# Patient Record
Sex: Female | Born: 1965 | Race: Black or African American | Hispanic: No | Smoking: Former smoker
Health system: Southern US, Community
[De-identification: ages and names within clinical notes are randomized; demographics above are authoritative.]

## PROBLEM LIST (undated history)

## (undated) DIAGNOSIS — D320 Benign neoplasm of cerebral meninges: Secondary | ICD-10-CM

## (undated) DIAGNOSIS — I739 Peripheral vascular disease, unspecified: Secondary | ICD-10-CM

## (undated) DIAGNOSIS — R51 Headache: Secondary | ICD-10-CM

## (undated) DIAGNOSIS — F419 Anxiety disorder, unspecified: Secondary | ICD-10-CM

## (undated) DIAGNOSIS — I779 Disorder of arteries and arterioles, unspecified: Secondary | ICD-10-CM

## (undated) DIAGNOSIS — IMO0001 Reserved for inherently not codable concepts without codable children: Secondary | ICD-10-CM

## (undated) DIAGNOSIS — I639 Cerebral infarction, unspecified: Secondary | ICD-10-CM

## (undated) DIAGNOSIS — R87619 Unspecified abnormal cytological findings in specimens from cervix uteri: Secondary | ICD-10-CM

## (undated) DIAGNOSIS — L719 Rosacea, unspecified: Secondary | ICD-10-CM

## (undated) DIAGNOSIS — Z531 Procedure and treatment not carried out because of patient's decision for reasons of belief and group pressure: Secondary | ICD-10-CM

## (undated) DIAGNOSIS — G500 Trigeminal neuralgia: Secondary | ICD-10-CM

## (undated) DIAGNOSIS — R519 Headache, unspecified: Secondary | ICD-10-CM

## (undated) HISTORY — PX: OOPHORECTOMY: SHX86

## (undated) HISTORY — DX: Unspecified abnormal cytological findings in specimens from cervix uteri: R87.619

## (undated) HISTORY — DX: Benign neoplasm of cerebral meninges: D32.0

## (undated) HISTORY — DX: Trigeminal neuralgia: G50.0

## (undated) HISTORY — DX: Cerebral infarction, unspecified: I63.9

## (undated) HISTORY — DX: Headache: R51

## (undated) HISTORY — PX: GYNECOLOGIC CRYOSURGERY: SHX857

## (undated) HISTORY — DX: Headache, unspecified: R51.9

## (undated) HISTORY — DX: Anxiety disorder, unspecified: F41.9

---

## 2016-12-17 DIAGNOSIS — R030 Elevated blood-pressure reading, without diagnosis of hypertension: Secondary | ICD-10-CM | POA: Diagnosis not present

## 2016-12-17 DIAGNOSIS — J209 Acute bronchitis, unspecified: Secondary | ICD-10-CM | POA: Diagnosis not present

## 2017-01-23 ENCOUNTER — Encounter: Payer: Self-pay | Admitting: Emergency Medicine

## 2017-01-23 ENCOUNTER — Emergency Department
Admission: EM | Admit: 2017-01-23 | Discharge: 2017-01-23 | Disposition: A | Discharge status: Home / Self Care | Payer: BLUE CROSS/BLUE SHIELD | Source: Home / Self Care | Attending: Family Medicine | Admitting: Family Medicine

## 2017-01-23 DIAGNOSIS — R053 Chronic cough: Secondary | ICD-10-CM

## 2017-01-23 DIAGNOSIS — J9801 Acute bronchospasm: Secondary | ICD-10-CM

## 2017-01-23 DIAGNOSIS — R05 Cough: Secondary | ICD-10-CM

## 2017-01-23 MED ORDER — BENZONATATE 200 MG PO CAPS: ORAL_CAPSULE | ORAL | 0 refills | Status: DC

## 2017-01-23 MED ORDER — ALBUTEROL SULFATE HFA 108 (90 BASE) MCG/ACT IN AERS: 2.0000 | INHALATION_SPRAY | RESPIRATORY_TRACT | 0 refills | Status: DC | PRN

## 2017-01-23 MED ORDER — PREDNISONE 20 MG PO TABS: ORAL_TABLET | ORAL | 0 refills | Status: DC

## 2017-01-23 NOTE — ED Triage Notes (Signed)
Pt c/o a non-productive cough x 1 month, wheezing, seen @ an urgent care, given a z-pack, albuterol inhaler, benzotate, some relief, but sxs have returned.

## 2017-01-23 NOTE — Discharge Instructions (Signed)
Take plain guaifenesin (1200mg extended release tabs such as Mucinex) twice daily, with plenty of water, for cough and congestion.  °Stop all antihistamines for now, and other non-prescription cough/cold preparations. °  °

## 2017-01-23 NOTE — ED Provider Notes (Signed)
[facility]    CSN: [id number] Arrival date & time: 01/23/17  [id number]     History   Chief Complaint Chief Complaint  Patient presents with  . Cough    HPI Ashley Beard is a 51 y.o. female.   Patient had a cold-like illness about one month ago.  She visited an urgent care where she was prescribed a Z-pak, albuterol inhaler, and Tessalon perles.  She generally improved but complains of persistent cough and occasional wheezing.  No fevers, chills, and sweats.  No pleuritic pain. She has a history of seasonal rhinitis and family history of asthma.   The history is provided by the patient.    History reviewed. No pertinent past medical history.  There are no active problems to display for this patient.   Past Surgical History:  Procedure Laterality Date  . OOPHORECTOMY      OB History    No data available       Home Medications    Prior to Admission medications   Medication Sig Start Date End Date Taking? Authorizing Provider  albuterol (PROVENTIL HFA;VENTOLIN HFA) 108 (90 Base) MCG/ACT inhaler Inhale 2 puffs into the lungs every 4 (four) hours as needed for wheezing or shortness of breath. 01/23/17   Kandra Nicolas, MD  benzonatate (TESSALON) 200 MG capsule Take one cap by mouth at bedtime as needed for cough.  May repeat in 4 to 6 hours 01/23/17   Kandra Nicolas, MD  predniSONE (DELTASONE) 20 MG tablet Take one tab by mouth twice daily for 5 days, then one daily for 3 days. Take with food. 01/23/17   Kandra Nicolas, MD    Family History Family History  Problem Relation Age of Onset  . Hypertension Other   . Diabetes Other     Social History Social History  Substance Use Topics  . Smoking status: Former Research scientist (life sciences)  . Smokeless tobacco: Never Used  . Alcohol use Yes     Allergies   Penicillins   Review of Systems Review of Systems No sore throat + cough No pleuritic pain + wheezing + nasal congestion + post-nasal drainage No sinus  pain/pressure No itchy/red eyes No earache No hemoptysis No SOB No fever/chills No nausea No vomiting No abdominal pain No diarrhea No urinary symptoms No skin rash No fatigue No myalgias No headache Used OTC meds without relief   Physical Exam Triage Vital Signs ED Triage Vitals  Enc Vitals Group     BP 01/23/17 1022 159/99     Pulse Rate 01/23/17 1022 80     Resp --      Temp 01/23/17 1022 97.9 F (36.6 C)     Temp Source 01/23/17 1022 Oral     SpO2 01/23/17 1022 98 %     Weight 01/23/17 1022 131 lb 12 oz (59.8 kg)     Height 01/23/17 1022 4\' 11"  (1.499 m)     Head Circumference --      Peak Flow --      Pain Score 01/23/17 1025 0     Pain Loc --      Pain Edu? --      Excl. in [place]? --    No data found.   Updated Vital Signs BP 159/99 (BP Location: Left Arm)   Pulse 80   Temp 97.9 F (36.6 C) (Oral)   Ht 4\' 11"  (1.499 m)   Wt 131 lb 12 oz (59.8 kg)   LMP 12/26/2016  SpO2 98%   BMI 26.61 kg/m   Visual Acuity Right Eye Distance:   Left Eye Distance:   Bilateral Distance:    Right Eye Near:   Left Eye Near:    Bilateral Near:     Physical Exam Nursing notes and Vital Signs reviewed. Appearance:  Patient appears stated age, and in no acute distress Eyes:  Pupils are equal, round, and reactive to light and accomodation.  Extraocular movement is intact.  Conjunctivae are not inflamed  Ears:  Canals normal.  Tympanic membranes normal.  Nose:  Mildly congested turbinates.  No sinus tenderness.   Pharynx:  Normal Neck:  Supple.  No adenopathy. Lungs:  Clear to auscultation.  Breath sounds are equal.  Moving air well. Heart:  Regular rate and rhythm without murmurs, rubs, or gallops.  Abdomen:  Nontender without masses or hepatosplenomegaly.  Bowel sounds are present.  No CVA or flank tenderness.  Extremities:  No edema.  Skin:  No rash present.    UC Treatments / Results  Labs (all labs ordered are listed, but only abnormal results are  displayed) Labs Reviewed - No data to display  EKG  EKG Interpretation None       Radiology No results found.  Procedures Procedures (including critical care time)  Medications Ordered in UC Medications - No data to display   Initial Impression / Assessment and Plan / UC Course  I have reviewed the triage vital signs and the nursing notes.  Pertinent labs & imaging results that were available during my care of the patient were reviewed by me and considered in my medical decision making (see chart for details).    There is no evidence of bacterial infection today.  Suspect post-infectious cough with bronchospasm. Begin prednisone burst/taper.  Prescription written for Benzonatate [facility]) to take at bedtime for night-time cough.  Refill albuterol inhaler. Take plain guaifenesin (1200mg  extended release tabs such as Mucinex) twice daily, with plenty of water, for cough and congestion.    Stop all antihistamines for now, and other non-prescription cough/cold preparations. Followup with Family Doctor if not improved in about 10 to 14 days.    Final Clinical Impressions(s) / UC Diagnoses   Final diagnoses:  Persistent cough for 3 weeks or longer  Bronchospasm    New Prescriptions New Prescriptions   ALBUTEROL (PROVENTIL HFA;VENTOLIN HFA) 108 (90 BASE) MCG/ACT INHALER    Inhale 2 puffs into the lungs every 4 (four) hours as needed for wheezing or shortness of breath.   BENZONATATE (TESSALON) 200 MG CAPSULE    Take one cap by mouth at bedtime as needed for cough.  May repeat in 4 to 6 hours   PREDNISONE (DELTASONE) 20 MG TABLET    Take one tab by mouth twice daily for 5 days, then one daily for 3 days. Take with food.     Kandra Nicolas, MD 01/31/17 [phone]

## 2017-01-26 DIAGNOSIS — R0789 Other chest pain: Secondary | ICD-10-CM | POA: Diagnosis not present

## 2017-01-26 DIAGNOSIS — R05 Cough: Secondary | ICD-10-CM | POA: Diagnosis not present

## 2017-01-26 DIAGNOSIS — Z880 Allergy status to penicillin: Secondary | ICD-10-CM | POA: Diagnosis not present

## 2017-01-26 DIAGNOSIS — F419 Anxiety disorder, unspecified: Secondary | ICD-10-CM | POA: Diagnosis not present

## 2017-01-26 DIAGNOSIS — R450 Nervousness: Secondary | ICD-10-CM | POA: Diagnosis not present

## 2017-01-26 DIAGNOSIS — R51 Headache: Secondary | ICD-10-CM | POA: Diagnosis not present

## 2017-02-15 ENCOUNTER — Emergency Department
Admission: EM | Admit: 2017-02-15 | Discharge: 2017-02-15 | Disposition: A | Discharge status: Home / Self Care | Payer: BLUE CROSS/BLUE SHIELD | Source: Home / Self Care | Attending: Family Medicine | Admitting: Family Medicine

## 2017-02-15 ENCOUNTER — Encounter: Payer: Self-pay | Admitting: *Deleted

## 2017-02-15 DIAGNOSIS — R5383 Other fatigue: Secondary | ICD-10-CM | POA: Diagnosis not present

## 2017-02-15 DIAGNOSIS — E061 Subacute thyroiditis: Secondary | ICD-10-CM

## 2017-02-15 LAB — POCT CBC W AUTO DIFF (K'VILLE URGENT CARE)

## 2017-02-15 LAB — POCT RAPID STREP A (OFFICE): Rapid Strep A Screen: NEGATIVE

## 2017-02-15 NOTE — Discharge Instructions (Signed)
Begin Ibuprofen 200mg , 4 tabs every 8 hours with food.

## 2017-02-15 NOTE — ED Triage Notes (Signed)
Pt c/o sore throat on the LT side x 1 wk. Denies fever.

## 2017-02-15 NOTE — ED Provider Notes (Signed)
[name]    CSN: [id number] Arrival date & time: 02/15/17  1526     History   Chief Complaint Chief Complaint  Patient presents with  . Sore Throat    HPI Ashley Beard is a 51 y.o. female.   Patient was treated for a post-infectious cough approximately 3 weeks ago with prednisone, Tessalon, and albuterol inhaler.  Her symptoms resolved, but during the past week she has developed sore throat, worse on the left with left neck soreness.  No fevers, chills, and sweats.  No URI symptoms otherwise. Family history significant for thyroid disease in her maternal grandmother and maternal aunt.   The history is provided by the patient.  Sore Throat  This is a new problem. Episode onset: one week ago. The problem occurs constantly. The problem has not changed since onset.Pertinent negatives include no chest pain. Associated symptoms comments: No URI symptoms . Nothing relieves the symptoms. She has tried nothing for the symptoms.    History reviewed. No pertinent past medical history.  There are no active problems to display for this patient.   Past Surgical History:  Procedure Laterality Date  . OOPHORECTOMY      OB History    No data available       Home Medications    Prior to Admission medications   Medication Sig Start Date End Date Taking? Authorizing Provider  albuterol (PROVENTIL HFA;VENTOLIN HFA) 108 (90 Base) MCG/ACT inhaler Inhale 2 puffs into the lungs every 4 (four) hours as needed for wheezing or shortness of breath. 01/23/17   Kandra Nicolas, MD    Family History Family History  Problem Relation Age of Onset  . Hypertension Other   . Diabetes Other   . Diabetes Maternal Grandmother   . Hypertension Maternal Grandmother     Social History Social History  Substance Use Topics  . Smoking status: Former Research scientist (life sciences)  . Smokeless tobacco: Never Used  . Alcohol use Yes     Allergies   Penicillins   Review of Systems Review of Systems    Constitutional: Negative for activity change, appetite change, chills, diaphoresis, fatigue and fever.  HENT: Positive for sore throat. Negative for congestion and postnasal drip.   Respiratory: Negative for cough.   Cardiovascular: Negative for chest pain.  Gastrointestinal: Negative.   Musculoskeletal: Negative for arthralgias.  Skin: Negative.   All other systems reviewed and are negative.    Physical Exam Triage Vital Signs ED Triage Vitals  Enc Vitals Group     BP 02/15/17 1545 155/90     Pulse Rate 02/15/17 1545 70     Resp 02/15/17 1545 16     Temp 02/15/17 1545 97.6 F (36.4 C)     Temp Source 02/15/17 1545 Oral     SpO2 02/15/17 1545 97 %     Weight 02/15/17 1546 128 lb (58.1 kg)     Height 02/15/17 1546 4\' 11"  (1.499 m)     Head Circumference --      Peak Flow --      Pain Score 02/15/17 1547 0     Pain Loc --      Pain Edu? --      Excl. in [place]? --    No data found.   Updated Vital Signs BP 155/90 (BP Location: Left Arm)   Pulse 70   Temp 97.6 F (36.4 C) (Oral)   Resp 16   Ht 4\' 11"  (1.499 m)   Wt 128 lb (58.1  kg)   LMP 01/25/2017   SpO2 97%   BMI 25.85 kg/m   Visual Acuity Right Eye Distance:   Left Eye Distance:   Bilateral Distance:    Right Eye Near:   Left Eye Near:    Bilateral Near:     Physical Exam  Constitutional: She appears well-developed and well-nourished. No distress.  HENT:  Head: Normocephalic.  Right Ear: Tympanic membrane, external ear and ear canal normal.  Left Ear: Tympanic membrane, external ear and ear canal normal.  Nose: Nose normal.  Mouth/Throat: Oropharynx is clear and moist. No oropharyngeal exudate, posterior oropharyngeal edema, posterior oropharyngeal erythema or tonsillar abscesses.  Eyes: Conjunctivae are normal. Pupils are equal, round, and reactive to light.  Neck: Normal range of motion. No thyroid mass and no thyromegaly present.    There is distinct tenderness to palpation over the left aspect of  thyroid, although it is not appreciably enlarged.  Left tonsillar nodes are distinctly tender to palpation.  Cardiovascular: Normal heart sounds.   Pulmonary/Chest: Breath sounds normal.  Abdominal: There is no tenderness.  Musculoskeletal: She exhibits no edema.  Lymphadenopathy:    She has cervical adenopathy.  Neurological: She is alert.  Skin: Skin is warm and dry.  Nursing note and vitals reviewed.    UC Treatments / Results  Labs (all labs ordered are listed, but only abnormal results are displayed) Labs Reviewed  TSH  T4, FREE  T3, FREE  C-REACTIVE PROTEIN  SEDIMENTATION RATE  CBC WITH DIFFERENTIAL/PLATELET  POCT RAPID STREP A (OFFICE) negative  POCT CBC W AUTO DIFF ([facility]):  WBC 7.8; LY 23.8; MO 5.9; GR 70.3; Hgb 13.0; Platelets 133     EKG  EKG Interpretation None       Radiology No results found.  Procedures Procedures (including critical care time)  Medications Ordered in UC Medications - No data to display   Initial Impression / Assessment and Plan / UC Course  I have reviewed the triage vital signs and the nursing notes.  Pertinent labs & imaging results that were available during my care of the patient were reviewed by me and considered in my medical decision making (see chart for details).    Negative rapid strep test and normal WBC reassuring (note mild thrombocytopenia).  Exam is highly suggestive of subacute thyroiditis (note family history of thyroid disease) Will check TSH, free T4, free T3, Sed rate and CRP. Begin Ibuprofen 200mg , 4 tabs every 8 hours with food.  Followup with PCP for further management    Final Clinical Impressions(s) / UC Diagnoses   Final diagnoses:  Subacute thyroiditis    New Prescriptions New Prescriptions   No medications on file     Kandra Nicolas, MD 02/18/17 [phone]

## 2017-02-16 LAB — T3, FREE: T3 FREE: 2.4 pg/mL (ref 2.3–4.2)

## 2017-02-16 LAB — SEDIMENTATION RATE: SED RATE: 28 mm/h — AB (ref 0–20)

## 2017-02-16 LAB — C-REACTIVE PROTEIN: CRP: 6.1 mg/L (ref <8.0)

## 2017-02-16 LAB — TSH: TSH: 1.91 mIU/L

## 2017-02-16 LAB — T4, FREE: Free T4: 1 ng/dL (ref 0.8–1.8)

## 2017-02-17 ENCOUNTER — Telehealth: Payer: Self-pay | Admitting: Emergency Medicine

## 2017-02-17 NOTE — Telephone Encounter (Signed)
Patient called and received lab results; throat still sore and would like offered antibiotic; Dr.Beese will escribe.

## 2017-02-17 NOTE — Telephone Encounter (Signed)
Left message for patient to call us and let us know how she is doing and also for [place] to share her lab results.

## 2017-02-18 NOTE — Care Management (Signed)
TSH, Free T4, Free T3, and C-reactive protein WNL.  Sed rate elevated 28.  Thyroiditis less likely.  Patient reports that she has a persistent sore throat.  Will cover for possible fusobacterium pharyngitis with Omnicef 300mg  BID.

## 2017-02-22 ENCOUNTER — Inpatient Hospital Stay (HOSPITAL_COMMUNITY)
Admission: EM | Admit: 2017-02-22 | Discharge: 2017-03-02 | DRG: 037 | Disposition: A | Discharge status: Home / Self Care | Payer: BLUE CROSS/BLUE SHIELD | Attending: Vascular Surgery | Admitting: Vascular Surgery

## 2017-02-22 ENCOUNTER — Institutional Professional Consult (permissible substitution): Payer: BLUE CROSS/BLUE SHIELD | Admitting: Family Medicine

## 2017-02-22 ENCOUNTER — Telehealth: Payer: Self-pay | Admitting: Physician Assistant

## 2017-02-22 ENCOUNTER — Ambulatory Visit (INDEPENDENT_AMBULATORY_CARE_PROVIDER_SITE_OTHER): Payer: BLUE CROSS/BLUE SHIELD

## 2017-02-22 ENCOUNTER — Ambulatory Visit (INDEPENDENT_AMBULATORY_CARE_PROVIDER_SITE_OTHER): Payer: BLUE CROSS/BLUE SHIELD | Admitting: Physician Assistant

## 2017-02-22 ENCOUNTER — Encounter (HOSPITAL_COMMUNITY): Payer: Self-pay | Admitting: Emergency Medicine

## 2017-02-22 ENCOUNTER — Encounter: Payer: Self-pay | Admitting: Physician Assistant

## 2017-02-22 ENCOUNTER — Telehealth: Payer: Self-pay

## 2017-02-22 VITALS — BP 137/93 | HR 71 | Ht 59.0 in | Wt 126.0 lb

## 2017-02-22 DIAGNOSIS — R030 Elevated blood-pressure reading, without diagnosis of hypertension: Secondary | ICD-10-CM | POA: Diagnosis not present

## 2017-02-22 DIAGNOSIS — Z91013 Allergy to seafood: Secondary | ICD-10-CM

## 2017-02-22 DIAGNOSIS — R590 Localized enlarged lymph nodes: Secondary | ICD-10-CM | POA: Diagnosis present

## 2017-02-22 DIAGNOSIS — R1310 Dysphagia, unspecified: Secondary | ICD-10-CM | POA: Insufficient documentation

## 2017-02-22 DIAGNOSIS — R591 Generalized enlarged lymph nodes: Secondary | ICD-10-CM

## 2017-02-22 DIAGNOSIS — F419 Anxiety disorder, unspecified: Secondary | ICD-10-CM | POA: Diagnosis not present

## 2017-02-22 DIAGNOSIS — R4702 Dysphasia: Secondary | ICD-10-CM | POA: Diagnosis not present

## 2017-02-22 DIAGNOSIS — Z8673 Personal history of transient ischemic attack (TIA), and cerebral infarction without residual deficits: Secondary | ICD-10-CM | POA: Diagnosis not present

## 2017-02-22 DIAGNOSIS — I7771 Dissection of carotid artery: Secondary | ICD-10-CM | POA: Diagnosis not present

## 2017-02-22 DIAGNOSIS — I10 Essential (primary) hypertension: Secondary | ICD-10-CM | POA: Insufficient documentation

## 2017-02-22 DIAGNOSIS — Z9889 Other specified postprocedural states: Secondary | ICD-10-CM | POA: Diagnosis not present

## 2017-02-22 DIAGNOSIS — Z1239 Encounter for other screening for malignant neoplasm of breast: Secondary | ICD-10-CM

## 2017-02-22 DIAGNOSIS — D696 Thrombocytopenia, unspecified: Secondary | ICD-10-CM | POA: Diagnosis not present

## 2017-02-22 DIAGNOSIS — G939 Disorder of brain, unspecified: Secondary | ICD-10-CM | POA: Diagnosis not present

## 2017-02-22 DIAGNOSIS — Z419 Encounter for procedure for purposes other than remedying health state, unspecified: Secondary | ICD-10-CM

## 2017-02-22 DIAGNOSIS — Z9102 Food additives allergy status: Secondary | ICD-10-CM | POA: Diagnosis not present

## 2017-02-22 DIAGNOSIS — Z23 Encounter for immunization: Secondary | ICD-10-CM

## 2017-02-22 DIAGNOSIS — Z79899 Other long term (current) drug therapy: Secondary | ICD-10-CM | POA: Diagnosis not present

## 2017-02-22 DIAGNOSIS — Z8249 Family history of ischemic heart disease and other diseases of the circulatory system: Secondary | ICD-10-CM | POA: Diagnosis not present

## 2017-02-22 DIAGNOSIS — R414 Neurologic neglect syndrome: Secondary | ICD-10-CM | POA: Diagnosis not present

## 2017-02-22 DIAGNOSIS — I6521 Occlusion and stenosis of right carotid artery: Secondary | ICD-10-CM | POA: Diagnosis not present

## 2017-02-22 DIAGNOSIS — I63522 Cerebral infarction due to unspecified occlusion or stenosis of left anterior cerebral artery: Secondary | ICD-10-CM | POA: Diagnosis not present

## 2017-02-22 DIAGNOSIS — Z880 Allergy status to penicillin: Secondary | ICD-10-CM | POA: Diagnosis not present

## 2017-02-22 DIAGNOSIS — I739 Peripheral vascular disease, unspecified: Secondary | ICD-10-CM | POA: Diagnosis not present

## 2017-02-22 DIAGNOSIS — I63312 Cerebral infarction due to thrombosis of left middle cerebral artery: Secondary | ICD-10-CM | POA: Diagnosis not present

## 2017-02-22 DIAGNOSIS — Z1231 Encounter for screening mammogram for malignant neoplasm of breast: Secondary | ICD-10-CM

## 2017-02-22 DIAGNOSIS — I639 Cerebral infarction, unspecified: Secondary | ICD-10-CM | POA: Diagnosis not present

## 2017-02-22 DIAGNOSIS — Z91018 Allergy to other foods: Secondary | ICD-10-CM | POA: Diagnosis not present

## 2017-02-22 DIAGNOSIS — I97821 Postprocedural cerebrovascular infarction during other surgery: Secondary | ICD-10-CM | POA: Diagnosis not present

## 2017-02-22 DIAGNOSIS — R4701 Aphasia: Secondary | ICD-10-CM | POA: Diagnosis not present

## 2017-02-22 DIAGNOSIS — R202 Paresthesia of skin: Secondary | ICD-10-CM | POA: Diagnosis not present

## 2017-02-22 DIAGNOSIS — I779 Disorder of arteries and arterioles, unspecified: Secondary | ICD-10-CM | POA: Diagnosis present

## 2017-02-22 DIAGNOSIS — D72829 Elevated white blood cell count, unspecified: Secondary | ICD-10-CM | POA: Diagnosis not present

## 2017-02-22 DIAGNOSIS — I63412 Cerebral infarction due to embolism of left middle cerebral artery: Secondary | ICD-10-CM | POA: Diagnosis not present

## 2017-02-22 DIAGNOSIS — Z87891 Personal history of nicotine dependence: Secondary | ICD-10-CM | POA: Diagnosis not present

## 2017-02-22 DIAGNOSIS — H53461 Homonymous bilateral field defects, right side: Secondary | ICD-10-CM | POA: Diagnosis not present

## 2017-02-22 DIAGNOSIS — Z90722 Acquired absence of ovaries, bilateral: Secondary | ICD-10-CM

## 2017-02-22 DIAGNOSIS — Z0000 Encounter for general adult medical examination without abnormal findings: Secondary | ICD-10-CM

## 2017-02-22 DIAGNOSIS — R471 Dysarthria and anarthria: Secondary | ICD-10-CM | POA: Diagnosis not present

## 2017-02-22 DIAGNOSIS — I6522 Occlusion and stenosis of left carotid artery: Secondary | ICD-10-CM | POA: Diagnosis not present

## 2017-02-22 DIAGNOSIS — Z833 Family history of diabetes mellitus: Secondary | ICD-10-CM | POA: Diagnosis not present

## 2017-02-22 DIAGNOSIS — I748 Embolism and thrombosis of other arteries: Secondary | ICD-10-CM | POA: Diagnosis not present

## 2017-02-22 DIAGNOSIS — I6789 Other cerebrovascular disease: Secondary | ICD-10-CM | POA: Diagnosis not present

## 2017-02-22 DIAGNOSIS — G459 Transient cerebral ischemic attack, unspecified: Secondary | ICD-10-CM

## 2017-02-22 HISTORY — DX: Procedure and treatment not carried out because of patient's decision for reasons of belief and group pressure: Z53.1

## 2017-02-22 HISTORY — DX: Reserved for inherently not codable concepts without codable children: IMO0001

## 2017-02-22 HISTORY — DX: Disorder of arteries and arterioles, unspecified: I77.9

## 2017-02-22 HISTORY — DX: Peripheral vascular disease, unspecified: I73.9

## 2017-02-22 LAB — COMPLETE METABOLIC PANEL WITH GFR
ALT: 14 U/L (ref 6–29)
AST: 19 U/L (ref 10–35)
Albumin: 4.3 g/dL (ref 3.6–5.1)
Alkaline Phosphatase: 62 U/L (ref 33–115)
BILIRUBIN TOTAL: 0.5 mg/dL (ref 0.2–1.2)
BUN: 12 mg/dL (ref 7–25)
CALCIUM: 9.3 mg/dL (ref 8.6–10.2)
CO2: 28 mmol/L (ref 20–31)
CREATININE: 0.98 mg/dL (ref 0.50–1.10)
Chloride: 103 mmol/L (ref 98–110)
GFR, EST AFRICAN AMERICAN: 78 mL/min (ref >=60)
GFR, Est Non African American: 68 mL/min (ref >=60)
Glucose, Bld: 92 mg/dL (ref 65–99)
Potassium: 3.5 mmol/L (ref 3.5–5.3)
Sodium: 140 mmol/L (ref 135–146)
TOTAL PROTEIN: 6.8 g/dL (ref 6.1–8.1)

## 2017-02-22 LAB — CBC WITH DIFFERENTIAL/PLATELET
Basophils Absolute: 0 10*3/uL (ref 0.0–0.1)
Basophils Relative: 0 %
EOS ABS: 0.2 10*3/uL (ref 0.0–0.7)
EOS PCT: 2 %
HCT: 39.8 % (ref 36.0–46.0)
HEMOGLOBIN: 13.8 g/dL (ref 12.0–15.0)
LYMPHS ABS: 2.8 10*3/uL (ref 0.7–4.0)
LYMPHS PCT: 28 %
MCH: 32.8 pg (ref 26.0–34.0)
MCHC: 34.7 g/dL (ref 30.0–36.0)
MCV: 94.5 fL (ref 78.0–100.0)
MONOS PCT: 8 %
Monocytes Absolute: 0.8 10*3/uL (ref 0.1–1.0)
NEUTROS PCT: 62 %
Neutro Abs: 6.2 10*3/uL (ref 1.7–7.7)
Platelets: 125 10*3/uL — ABNORMAL LOW (ref 150–400)
RBC: 4.21 MIL/uL (ref 3.87–5.11)
RDW: 12.8 % (ref 11.5–15.5)
WBC: 10 10*3/uL (ref 4.0–10.5)

## 2017-02-22 LAB — BASIC METABOLIC PANEL
Anion gap: 13 (ref 5–15)
BUN: 15 mg/dL (ref 6–20)
CHLORIDE: 105 mmol/L (ref 101–111)
CO2: 21 mmol/L — ABNORMAL LOW (ref 22–32)
Calcium: 9 mg/dL (ref 8.9–10.3)
Creatinine, Ser: 1.04 mg/dL — ABNORMAL HIGH (ref 0.44–1.00)
GFR calc Af Amer: >60 mL/min (ref >60)
GFR calc non Af Amer: >60 mL/min (ref >60)
GLUCOSE: 99 mg/dL (ref 65–99)
POTASSIUM: 3.2 mmol/L — AB (ref 3.5–5.1)
SODIUM: 139 mmol/L (ref 135–145)

## 2017-02-22 LAB — PROTIME-INR
INR: 0.88
PROTHROMBIN TIME: 11.9 s (ref 11.4–15.2)

## 2017-02-22 LAB — I-STAT CHEM 8, ED
BUN: 17 mg/dL (ref 6–20)
CALCIUM ION: 1.05 mmol/L — AB (ref 1.15–1.40)
CHLORIDE: 104 mmol/L (ref 101–111)
Creatinine, Ser: 0.9 mg/dL (ref 0.44–1.00)
GLUCOSE: 102 mg/dL — AB (ref 65–99)
HCT: 38 % (ref 36.0–46.0)
Hemoglobin: 12.9 g/dL (ref 12.0–15.0)
POTASSIUM: 3.2 mmol/L — AB (ref 3.5–5.1)
Sodium: 139 mmol/L (ref 135–145)
TCO2: 29 mmol/L (ref 0–100)

## 2017-02-22 MED ORDER — IOPAMIDOL (ISOVUE-300) INJECTION 61%
75.0000 mL | Freq: Once | INTRAVENOUS | Status: AC | PRN
  Administered 2017-02-22: 75 mL via INTRAVENOUS

## 2017-02-22 MED ORDER — GUAIFENESIN-DM 100-10 MG/5ML PO SYRP: 15.0000 mL | ORAL_SOLUTION | ORAL | Status: DC | PRN

## 2017-02-22 MED ORDER — SODIUM CHLORIDE 0.9% FLUSH: 3.0000 mL | INTRAVENOUS | Status: DC | PRN

## 2017-02-22 MED ORDER — ALUM & MAG HYDROXIDE-SIMETH 200-200-20 MG/5ML PO SUSP: 15.0000 mL | ORAL | Status: DC | PRN

## 2017-02-22 MED ORDER — LABETALOL HCL 5 MG/ML IV SOLN: 10.0000 mg | INTRAVENOUS | Status: DC | PRN

## 2017-02-22 MED ORDER — HEPARIN (PORCINE) IN NACL 100-0.45 UNIT/ML-% IJ SOLN
950.0000 [IU]/h | INTRAMUSCULAR | Status: DC
  Administered 2017-02-22: 950 [IU]/h via INTRAVENOUS
  Filled 2017-02-22: qty 250

## 2017-02-22 MED ORDER — PHENOL 1.4 % MT LIQD: 1.0000 | OROMUCOSAL | Status: DC | PRN

## 2017-02-22 MED ORDER — SODIUM CHLORIDE 0.9% FLUSH
3.0000 mL | Freq: Two times a day (BID) | INTRAVENOUS | Status: DC
Start: 2017-02-22 — End: 2017-02-24
  Administered 2017-02-22: 3 mL via INTRAVENOUS

## 2017-02-22 MED ORDER — METOPROLOL TARTRATE 5 MG/5ML IV SOLN
2.0000 mg | INTRAVENOUS | Status: DC | PRN
Start: 2017-02-22 — End: 2017-02-24

## 2017-02-22 MED ORDER — ONDANSETRON HCL 4 MG/2ML IJ SOLN: 4.0000 mg | Freq: Four times a day (QID) | INTRAMUSCULAR | Status: DC | PRN

## 2017-02-22 MED ORDER — HEPARIN BOLUS VIA INFUSION
3500.0000 [IU] | Freq: Once | INTRAVENOUS | Status: AC
  Administered 2017-02-22: 3500 [IU] via INTRAVENOUS
  Filled 2017-02-22: qty 3500

## 2017-02-22 MED ORDER — POTASSIUM CHLORIDE CRYS ER 20 MEQ PO TBCR
20.0000 meq | EXTENDED_RELEASE_TABLET | Freq: Once | ORAL | Status: AC
  Administered 2017-02-22: 40 meq via ORAL
  Filled 2017-02-22: qty 2

## 2017-02-22 MED ORDER — PANTOPRAZOLE SODIUM 40 MG PO TBEC
40.0000 mg | DELAYED_RELEASE_TABLET | Freq: Every day | ORAL | Status: DC
  Administered 2017-02-23: 40 mg via ORAL
  Filled 2017-02-22: qty 1

## 2017-02-22 MED ORDER — SODIUM CHLORIDE 0.9 % IV SOLN: 250.0000 mL | INTRAVENOUS | Status: DC | PRN

## 2017-02-22 MED ORDER — HYDRALAZINE HCL 20 MG/ML IJ SOLN: 5.0000 mg | INTRAMUSCULAR | Status: DC | PRN

## 2017-02-22 NOTE — Telephone Encounter (Signed)
Patient's CT neck revealed a 9 mm thrombus or plaque at the left carotid bifurcation with extension into the left external carotid artery concerning for carotid dissection.   I spoke with Dr. Curt Jews, vascular surgeon on-call, who recommended patient for admission to their service for anticoagulation and possible surgery this evening.   I called the patient, informed her of the thrombus in her left carotid, explained that this was a medical emergency and instructed her to present to [facility] ED as soon as possible. She expressed understanding and is contacting her sister for transport there now.  I called [facility] and gave report to triage nurse.  Darlyne Russian PA-C

## 2017-02-22 NOTE — ED Provider Notes (Signed)
[facility] DEPT Provider Note   CSN: [id number] Arrival date & time: 02/22/17  1922     History   Chief Complaint Chief Complaint  Patient presents with  . Throat Mass needs surgery    HPI Ashley Beard is a 51 y.o. female.  51 yo F with a chief complaint of right-sided throat pain. Is worse when she swallows. Been going on for the past couple months or longer. The patient went to urgent care there was concern that she had subacute thyroiditis and labs that were performed. She returned a couple days will later with no improvement in there is some anterior cervical lymphadenopathy and so she went for a CT scan of the neck. This was concerning for clot burden possibly consistent with a carotid artery dissection. Case was discussed with vascular who recommend that she be sent to the ED for admission.   The history is provided by the patient.  Illness  This is a new problem. The current episode started more than 1 week ago. The problem occurs rarely. The problem has not changed since onset.Pertinent negatives include no chest pain, no headaches and no shortness of breath. Nothing aggravates the symptoms. Nothing relieves the symptoms. She has tried nothing for the symptoms. The treatment provided no relief.    Past Medical History:  Diagnosis Date  . Abnormal Pap smear of cervix    age 8  . Anxiety     Patient Active Problem List   Diagnosis Date Noted  . Odynophagia 02/22/2017  . Lymphadenopathy of head and neck 02/22/2017  . Elevated blood pressure reading 02/22/2017    Past Surgical History:  Procedure Laterality Date  . GYNECOLOGIC CRYOSURGERY     age 23  . OOPHORECTOMY      OB History    No data available       Home Medications    Prior to Admission medications   Medication Sig Start Date End Date Taking? Authorizing Provider  albuterol (PROVENTIL HFA;VENTOLIN HFA) 108 (90 Base) MCG/ACT inhaler Inhale 2 puffs into the lungs every 4 (four) hours as needed  for wheezing or shortness of breath. 01/23/17   Kandra Nicolas, MD    Family History Family History  Problem Relation Age of Onset  . Hypertension Other   . Diabetes Other   . Diabetes Maternal Grandmother   . Hypertension Maternal Grandmother     Social History Social History  Substance Use Topics  . Smoking status: Former Smoker    Types: Cigarettes    Quit date: 12/2016  . Smokeless tobacco: Never Used     Comment: very rare, social  . Alcohol use 1.2 oz/week    2 Cans of beer per week     Comment: social     Allergies   Penicillins   Review of Systems Review of Systems  Constitutional: Negative for chills and fever.  HENT: Positive for sore throat. Negative for congestion and rhinorrhea.   Eyes: Negative for redness and visual disturbance.  Respiratory: Negative for shortness of breath and wheezing.   Cardiovascular: Negative for chest pain and palpitations.  Gastrointestinal: Negative for nausea and vomiting.  Genitourinary: Negative for dysuria and urgency.  Musculoskeletal: Negative for arthralgias and myalgias.  Skin: Negative for pallor and wound.  Neurological: Negative for dizziness and headaches.     Physical Exam Updated Vital Signs BP (!) 161/99   Pulse 71   Temp 98.3 F (36.8 C) (Oral)   Resp 16   Ht 4\' 11"  (  1.499 m)   Wt 127 lb (57.6 kg)   LMP 01/25/2017   SpO2 100%   BMI 25.65 kg/m   Physical Exam  Constitutional: She is oriented to person, place, and time. She appears well-developed and well-nourished. No distress.  HENT:  Head: Normocephalic and atraumatic.  Eyes: EOM are normal. Pupils are equal, round, and reactive to light.  Neck: Normal range of motion. Neck supple.  Cardiovascular: Normal rate and regular rhythm.  Exam reveals no gallop and no friction rub.   No murmur heard. Pulmonary/Chest: Effort normal. She has no wheezes. She has no rales.  Abdominal: Soft. She exhibits no distension. There is no tenderness.    Musculoskeletal: She exhibits no edema or tenderness.  Neurological: She is alert and oriented to person, place, and time.  Grossly intact   Skin: Skin is warm and dry. She is not diaphoretic.  Psychiatric: She has a normal mood and affect. Her behavior is normal.  Nursing note and vitals reviewed.    ED Treatments / Results  Labs (all labs ordered are listed, but only abnormal results are displayed) Labs Reviewed  CBC WITH DIFFERENTIAL/PLATELET - Abnormal; Notable for the following:       Result Value   Platelets 125 (*)    All other components within normal limits  I-STAT CHEM 8, ED - Abnormal; Notable for the following:    Potassium 3.2 (*)    Glucose, Bld 102 (*)    Calcium, Ion 1.05 (*)    All other components within normal limits  BASIC METABOLIC PANEL  PROTIME-INR    EKG  EKG Interpretation None       Radiology Ct Soft Tissue Neck W Contrast  Result Date: 02/22/2017 CLINICAL DATA:  Left neck pain with swallowing. Lymphadenopathy of the head and neck. Odynophagia. EXAM: CT NECK WITH CONTRAST TECHNIQUE: Multidetector CT imaging of the neck was performed using the standard protocol following the bolus administration of intravenous contrast. CONTRAST:  8mL ISOVUE-300 IOPAMIDOL (ISOVUE-300) INJECTION 61% COMPARISON:  None. FINDINGS: Pharynx and larynx: There is mild prominence of the palatine tonsils bilaterally, left greater than right. No discrete mass lesion is present. The parapharyngeal fat is clear. Vocal cords are opposed. The patient is likely holding her breath. No other focal mucosal or submucosal lesions are evident. Salivary glands: The submandibular and parotid glands are within normal limits bilaterally. Thyroid: A 6 mm nodule in the upper pole of the left lobe of the thyroid measures 6 mm maximally. No other focal lesions are present. Lymph nodes: A right level 3 lymph node may represent 2 adjacent nodes. The nodal mass measures 22 x 6 x 8 mm. No other  significant adenopathy is present. Vascular: A low-density clot or plaque sits at the level of the left carotid bifurcation measuring 8 x 8 x 9 mm. There is thrombus extending into the left external carotid artery. No other significant vascular disease is present. A three-vessel aortic arch is present. Limited intracranial: Negative Visualized orbits: The globes and orbits are within normal limits. Mastoids and visualized paranasal sinuses: The paranasal sinuses and mastoid air cells are clear. Skeleton: No focal lytic or blastic lesions are present. Vertebral body heights and alignment are normal. Upper chest: The lung apices demonstrate mild dependent atelectasis. No focal nodule, mass, or airspace disease is present. IMPRESSION: 1. 9 mm thrombus or plaque at the left carotid bifurcation with extension into the left external carotid artery. This could be related to the patient's left-sided neck pain. This may  be from a central source will represent a contained dissection. The recommend emergent evaluation with consideration of anti coagulation to prevent intracranial embolization. 2. No other significant vascular disease. 3. Slight prominence of the palatine tonsils. No adjacent inflammatory change is evident. 4. Enlarged right level 3 lymph node is likely reactive. This may represent 2 adjacent nodes. These results were called by telephone at the time of interpretation on 02/22/2017 at 4:39 pm to Dr. Nelson Chimes , who verbally acknowledged these results. Electronically Signed   By: San Morelle M.D.   On: 02/22/2017 16:39    Procedures Procedures (including critical care time)  Medications Ordered in ED Medications - No data to display   Initial Impression / Assessment and Plan / ED Course  I have reviewed the triage vital signs and the nursing notes.  Pertinent labs & imaging results that were available during my care of the patient were reviewed by me and considered in my medical  decision making (see chart for details).     51 yo F With a chief complaint of right sided neck pain. Found to have a left carotid artery clot. Vascular to admit.   The patients results and plan were reviewed and discussed.   Any x-rays performed were independently reviewed by myself.   Differential diagnosis were considered with the presenting HPI.  Medications - No data to display  Vitals:   02/22/17 1933 02/22/17 1934 02/22/17 2023  BP: (!) 146/91  (!) 161/99  Pulse: 81  71  Resp: 16  16  Temp: 98.3 F (36.8 C)    TempSrc: Oral    SpO2: 100%  100%  Weight:  127 lb (57.6 kg)   Height:  4\' 11"  (1.499 m)     Final diagnoses:  Carotid artery dissection (HCC)    Admission/ observation were discussed with the admitting physician, patient and/or family and they are comfortable with the plan.    Final Clinical Impressions(s) / ED Diagnoses   Final diagnoses:  Carotid artery dissection [facility])    New Prescriptions New Prescriptions   No medications on file     Deno Etienne, DO 02/22/17 2033

## 2017-02-22 NOTE — ED Notes (Signed)
Notified Early MD of pt's presence in department. MD states he is on his way.

## 2017-02-22 NOTE — H&P (Signed)
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 female, who is seen today for evaluation of thrombus in her left carotid artery. She is otherwise healthy 51 year old female. She has had a several month history of discomfort and a burning sensation with swallowing. Also had was felt to be a sinus infection and was treated for this. This is failed to resolve and she saw her primary care physician today for continued evaluation. Patient underwent CT scan as an outpatient for this. The scan did not show any cause for the swallowing difficulty but did show a finding of a large clot located in her left carotid bifurcation with extension into her left external carotid artery. I was consulted and suggested the patient be sent to [facility] for admission anticoagulation and further workup and surgery. I'm seeing the patient in the emergency department and her 2 daughters are present with her. She does not have any history of other clotting disorders. Her only past medical history is significant for anxiety. She is right-handed. On further questioning regarding any neurologic deficits, he does relate a event approximately 2-3 weeks ago where she had limp and numbness in her right arm. This lasted for several minutes and then not completely resolved. She is not had any speech difficulties and no right leg difficulty. She has been completely neurologically intact otherwise. She has no history of cardiac irregularity.  Past Medical History:  Diagnosis Date  . Abnormal Pap smear of cervix    age 33  . Anxiety     Family History  Problem Relation Age of Onset  . Hypertension Other   . Diabetes Other   . Diabetes Maternal Grandmother   . Hypertension Maternal Grandmother     SOCIAL HISTORY: Social History    Social History  . Marital status: Single    Spouse name: N/A  . Number of children: N/A  . Years of education: N/A   Occupational History  . Not on file.   Social History Main Topics  . Smoking status: Former Smoker    Types: Cigarettes    Quit date: 12/2016  . Smokeless tobacco: Never Used     Comment: very rare, social  . Alcohol use 1.2 oz/week    2 Cans of beer per week     Comment: social  . Drug use: No  . Sexual activity: Yes    Birth control/ protection: Condom   Other Topics Concern  . Not on file   Social History Narrative  . No narrative on file    Allergies  Allergen Reactions  . Penicillins     No current facility-administered medications for this encounter.    Current Outpatient Prescriptions  Medication Sig Dispense Refill  . albuterol (PROVENTIL HFA;VENTOLIN HFA) 108 (90 Base) MCG/ACT inhaler Inhale 2 puffs into the lungs every 4 (four) hours as needed for wheezing or shortness of breath. 1 Inhaler 0    REVIEW OF SYSTEMS:  [X]  denotes positive finding, [ ]  denotes negative finding Cardiac  Comments:  Chest pain or chest pressure:    Shortness of breath upon exertion:    Short of breath when lying flat:    Irregular heart rhythm:        Vascular    Pain in calf, thigh, or hip brought on by ambulation:    Pain in feet  at night that wakes you up from your sleep:     Blood clot in your veins:    Leg swelling:         Pulmonary    Oxygen at home:    Productive cough:     Wheezing:         Neurologic    Sudden weakness in arms or legs:  x Recent event 2-3 weeks ago   Sudden numbness in arms or legs:  x   Sudden onset of difficulty speaking or slurred speech:    Temporary loss of vision in one eye:     Problems with dizziness:         Gastrointestinal    Blood in stool:     Vomited blood:         Genitourinary    Burning when urinating:     Blood in urine:        Psychiatric    Major depression:         Hematologic     Bleeding problems:    Problems with blood clotting too easily:        Skin    Rashes or ulcers:        Constitutional    Fever or chills:      PHYSICAL EXAM: Vitals:   02/22/17 1933 02/22/17 1934 02/22/17 2023  BP: (!) 146/91  (!) 161/99  Pulse: 81  71  Resp: 16  16  Temp: 98.3 F (36.8 C)    TempSrc: Oral    SpO2: 100%  100%  Weight:  127 lb (57.6 kg)   Height:  4\' 11"  (1.499 m)     GENERAL: The patient is a well-nourished female, in no acute distress. The vital signs are documented above. CARDIOVASCULAR: Carotid arteries are without bruits bilaterally. 2+ radial and 2+ dorsalis pedis pulses bilaterally PULMONARY: There is good air exchange  ABDOMEN: Soft and non-tender . No bruit noted. MUSCULOSKELETAL: There are no major deformities or cyanosis. NEUROLOGIC: No focal weakness or paresthesias are detected. SKIN: There are no ulcers or rashes noted. PSYCHIATRIC: The patient has a normal affect.  DATA:  CT scan was reviewed. Images were reviewed and discussed with the patient and her daughters. She does have a 1 cm defect in the left carotid bifurcation that appears to be clot. This does have an extension into the external carotid. Internal carotid artery above this is normal. She has a normal aortic arch with no evidence of plaque in the right carotid or in the remaining vascular anatomy seen on the CT  of her neck.  MEDICAL ISSUES: Very unusual presentation. Had a clear-cut left brain TIA 2-3 weeks ago which most likely are related to the clot in her left carotid bifurcation. Much more difficult to relate the swallowing difficulty to this. I do agree that the patient will need ENT evaluation after her carotid thrombus is addressed. Will be admitted tonight for anticoagulation. Will undergo 2-D echocardiogram tomorrow and MRI of her brain tomorrow to evaluate if injury has occurred related to her recent TIA in the clot in her carotid. Will plan for left carotid surgery on  Wednesday. Explained that this would be standard incision with control of the artery opening of the artery and endarterectomy and removal of the clot.   Rosetta Posner, MD FACS [facility] Tel [phone] Pager [phone]

## 2017-02-22 NOTE — Telephone Encounter (Signed)
Received. Thank you.

## 2017-02-22 NOTE — ED Triage Notes (Signed)
Pt. advised by her MD to go to ER for throat ( mass) surgery , respirations unlabored/denies pain or fever .

## 2017-02-22 NOTE — ED Notes (Signed)
Early MD at bedside

## 2017-02-22 NOTE — Progress Notes (Signed)
ANTICOAGULATION CONSULT NOTE - Initial Consult  Pharmacy Consult for Heparin  Indication: Carotid thrombus  Allergies  Allergen Reactions  . Penicillins    Patient Measurements: Height: 4\' 11"  (149.9 cm) Weight: 127 lb (57.6 kg) IBW/kg (Calculated) : 43.2 Heparin Dosing Weight: 55kg  Vital Signs: Temp: 98.3 F (36.8 C) (03/19 1933) Temp Source: Oral (03/19 1933) BP: 158/98 (03/19 2030) Pulse Rate: 75 (03/19 2030)  Labs:  Recent Labs  02/22/17 1047 02/22/17 2001 02/22/17 2021  HGB  --  13.8 12.9  HCT  --  39.8 38.0  PLT  --  125*  --   LABPROT  --  11.9  --   INR  --  0.88  --   CREATININE 0.98 1.04* 0.90    Estimated Creatinine Clearance: 58.5 mL/min (by C-G formula based on SCr of 0.9 mg/dL).   Medical History: Past Medical History:  Diagnosis Date  . Abnormal Pap smear of cervix    age 51  . Anxiety    Medications:   (Not in a hospital admission) Scheduled:  Infusions:   Assessment: 51 yof admitted 3/319 w/ thrombus in left carotid artery. Patient with TIA 2-3 weeks ago. No anticoagulation PTA. Bolus per MD.   Baseline hemoglobin is within normal limits and platelet count low at 125k/uL. No bleeding noted.   Goal of Therapy:  Heparin level 0.3-0.7 units/ml Monitor platelets by anticoagulation protocol: Yes   Plan:  Give 3500 units bolus x 1 Start heparin infusion at 950 units/hr Check anti-Xa level in 6 hours and daily while on heparin Continue to monitor H&H and platelets  F/U plans for oral anticoagulation   Demetrius Charity, PharmD Acute Care Pharmacy Resident  Pager: [phone] 02/22/2017

## 2017-02-22 NOTE — Patient Instructions (Addendum)
- I have ordered a CT of your neck with IV contrast. You will be contacted to schedule this week - Go downstairs for labs to check your kidney function prior to CT scan   - I have also ordered your mammogram. You will be contacted to schedule this week   Blood pressure: - monitor at home in a relaxed setting - limit salt. Follow DASH eating plan   How to Take Your Blood Pressure Blood pressure is a measurement of how strongly your blood is pressing against the walls of your arteries. Arteries are blood vessels that carry blood from your heart throughout your body. Your health care provider takes your blood pressure at each office visit. You can also take your own blood pressure at home with a blood pressure machine. You may need to take your own blood pressure:  To confirm a diagnosis of high blood pressure (hypertension).  To monitor your blood pressure over time.  To make sure your blood pressure medicine is working. Supplies needed: To take your blood pressure, you will need a blood pressure machine. You can buy a blood pressure machine, or blood pressure monitor, at most drugstores or online. There are several types of home blood pressure monitors. When choosing one, consider the following:  Choose a monitor that has an arm cuff.  Choose a monitor that wraps snugly around your upper arm. You should be able to fit only one finger between your arm and the cuff.  Do not choose a monitor that measures your blood pressure from your wrist or finger. Your health care provider can suggest a reliable monitor that will meet your needs. How to prepare To get the most accurate reading, avoid the following for 30 minutes before you check your blood pressure:  Drinking caffeine.  Drinking alcohol.  Eating.  Smoking.  Exercising. Five minutes before you check your blood pressure:  Empty your bladder.  Sit quietly without talking in a dining chair, rather than in a soft couch or  armchair. How to take your blood pressure To check your blood pressure, follow the instructions in the manual that came with your blood pressure monitor. If you have a digital blood pressure monitor, the instructions may be as follows: 1. Sit up straight. 2. Place your feet on the floor. Do not cross your ankles or legs. 3. Rest your left arm at the level of your heart on a table or desk or on the arm of a chair. 4. Pull up your shirt sleeve. 5. Wrap the blood pressure cuff around the upper part of your left arm, 1 inch (2.5 cm) above your elbow. It is best to wrap the cuff around bare skin. 6. Fit the cuff snugly around your arm. You should be able to place only one finger between the cuff and your arm. 7. Position the cord inside the groove of your elbow. 8. Press the power button. 9. Sit quietly while the cuff inflates and deflates. 10. Read the digital reading on the monitor screen and write it down (record it). 11. Wait 2-3 minutes, then repeat the steps, starting at step 1. What does my blood pressure reading mean? A blood pressure reading consists of a higher number over a lower number. Ideally, your blood pressure should be below 120/80. The first ("top") number is called the systolic pressure. It is a measure of the pressure in your arteries as your heart beats. The second ("bottom") number is called the diastolic pressure. It is a measure  of the pressure in your arteries as the heart relaxes. Blood pressure is classified into four stages. The following are the stages for adults who do not have a short-term serious illness or a chronic condition. Systolic pressure and diastolic pressure are measured in a unit called mm Hg. Normal   Systolic pressure: below 295.  Diastolic pressure: below 80. Elevated   Systolic pressure: 188-416.  Diastolic pressure: below 80. Hypertension stage 1   Systolic pressure: 606-301.  Diastolic pressure: 60-10. Hypertension stage 2   Systolic  pressure: 932 or above.  Diastolic pressure: 90 or above. You can have prehypertension or hypertension even if only the systolic or only the diastolic number in your reading is higher than normal. Follow these instructions at home:  Check your blood pressure as often as recommended by your health care provider.  Take your monitor to the next appointment with your health care provider to make sure:  That you are using it correctly.  That it provides accurate readings.  Be sure you understand what your goal blood pressure numbers are.  Tell your health care provider if you are having any side effects from blood pressure medicine. Contact a health care provider if:  Your blood pressure is consistently high. Get help right away if:  Your systolic blood pressure is higher than 180.  Your diastolic blood pressure is higher than 110. This information is not intended to replace advice given to you by your health care provider. Make sure you discuss any questions you have with your health care provider. Document Released: 05/01/2016 Document Revised: 07/14/2016 Document Reviewed: 05/01/2016 Elsevier Interactive Patient Education  2017 [employer].

## 2017-02-22 NOTE — Telephone Encounter (Signed)
[facility] called with stat lab results on CMP.  They are in her chart.

## 2017-02-22 NOTE — Progress Notes (Signed)
HPI:                                                                Ashley Beard is a 51 y.o. female who presents to [facility] today to establish care   Current Concerns include thyroid  Patient reports left-sided throat pain with swallowing for approximately 2 months. States occasionally she has swelling of her neck and lymph nodes on the left side. She describes the pain as burning and it causes her to discontinue eating. She admits to a 4-pound unintentional weight loss. Denies radiation to the ear. Patient states she was put on Prednisone for an upper respiratory infection and steroids did improve her symptoms temporarily. She denies dysphagia. She denies fever, chills, dyspepsia, epigastric pain. Patient is a nonsmoker, but reports briefly experimenting with cigarettes two months ago as a social activity.  She was seen in urgent care on 02/15/17 for sore throat and left-sided neck soreness. Symptoms were preceded by a viral URI. She was diagnosed clinically with subacute thyroiditis. TSH, free T4, and T3 were wnl. ESR was elevated at 28.   Health Maintenance Health Maintenance  Topic Date Due  . HIV Screening  12/03/1982  . MAMMOGRAM  12/03/1985  . TETANUS/TDAP  12/03/1986  . PAP SMEAR  12/03/1988  . INFLUENZA VACCINE  08/08/2019 (Originally 07/07/2016)    GYN/Sexual Health  Menstrual status: having menstrual periods  LMP: 01/24/17  Menses: irregular, light, lasts 1-2 days  Last pap smear: 2010, negative [facility] - [facility]  History of abnormal pap smears: yes, age 23   Sexually active: yes, 1 female partner  Current contraception: condoms   Past Medical History:  Diagnosis Date  . Abnormal Pap smear of cervix    age 41  . Anxiety    Past Surgical History:  Procedure Laterality Date  . GYNECOLOGIC CRYOSURGERY     age 53  . OOPHORECTOMY     Social History  Substance Use Topics  . Smoking status: Former Smoker   Types: Cigarettes    Quit date: 12/2016  . Smokeless tobacco: Never Used     Comment: very rare, social  . Alcohol use 1.2 oz/week    2 Cans of beer per week     Comment: social   family history includes Diabetes in her maternal grandmother and other; Hypertension in her maternal grandmother and other.  ROS: negative except as noted in the HPI  Medications: Current Outpatient Prescriptions  Medication Sig Dispense Refill  . albuterol (PROVENTIL HFA;VENTOLIN HFA) 108 (90 Base) MCG/ACT inhaler Inhale 2 puffs into the lungs every 4 (four) hours as needed for wheezing or shortness of breath. 1 Inhaler 0   No current facility-administered medications for this visit.    Allergies  Allergen Reactions  . Penicillins        Objective:  BP (!) 137/93   Pulse 71   Ht _0  (1.499 m)   Wt 126 lb (57.2 kg)   LMP 01/25/2017   BMI 25.45 kg/m  Gen: well-groomed, cooperative, not ill-appearing, no distress HEENT: normal conjunctiva, TM's clear, oropharynx clear, moist mucus membranes, trachea midline, no thyromegaly or tenderness Pulm: Normal work of breathing, normal phonation, clear to auscultation bilaterally CV: Normal rate, regular  rhythm, s1 and s2 distinct, no murmurs, clicks or rubs, no carotid bruit Neuro: alert and oriented x 3, EOM's intact, no tremor MSK: moving all extremities, normal gait and station, no peripheral edema Lymph: no appreciable cervical or supraclavicular adenopathy, patient tender in left tonsillar distribution Skin: warm and dry, no rashes or lesions on exposed skin Psych: normal affect, euthymic mood, normal speech and thought content  Lab Results  Component Value Date   TSH 1.91 02/15/2017  free T3 - 2.4 Free T4 - 1.0  WBC 7.8 RBC 4.15 Hgb 13.0 Hct 39.5 MCV 95.1 Plt 133   Lab Results  Component Value Date   ESRSEDRATE 28 (H) 02/15/2017   Lab Results  Component Value Date   CRP 6.1 02/15/2017     Assessment and Plan: 51 y.o. female  with    Lymphadenopathy of head and neck, Odynophagia - CT soft tissue neck w/contrast scheduled for this afternoon to r/o neoplasia. Checking BUN and SCr stat today - If CT negative, plan to trial Fluconazole for empiric tx candidal esophagitis and PPI. Will refer to ENT if no improvement in 2 weeks.  Encounter for preventive health examination - reviewed labs obtained by urgent care - screening mammogram ordered - Tdap given today - Pap due   Breast cancer screening - MM DIGITAL SCREENING BILATERAL; Future  Elevated Blood Pressure Reading - monitor at home in a relaxed setting. BP log. - limit salt. Follow DASH eating plan - follow-up in 2 weeks  Patient education and anticipatory guidance given Patient agrees with treatment plan Follow-up in 2 weeks or sooner as needed  Darlyne Russian PA-C

## 2017-02-23 ENCOUNTER — Inpatient Hospital Stay (HOSPITAL_COMMUNITY): Payer: BLUE CROSS/BLUE SHIELD

## 2017-02-23 ENCOUNTER — Encounter (HOSPITAL_COMMUNITY): Payer: Self-pay | Admitting: *Deleted

## 2017-02-23 DIAGNOSIS — I6789 Other cerebrovascular disease: Secondary | ICD-10-CM

## 2017-02-23 LAB — ECHOCARDIOGRAM COMPLETE
CHL CUP TV REG PEAK VELOCITY: 222 cm/s
EERAT: 7.86
EWDT: 222 ms
FS: 28 % (ref 28–44)
Height: 59 in
IVS/LV PW RATIO, ED: 0.93
LA ID, A-P, ES: 21 mm
LA diam end sys: 21 mm
LA vol A4C: 24.1 ml
LA vol index: 19.2 mL/m2
LA vol: 29 mL
LADIAMINDEX: 1.39 cm/m2
LV TDI E'LATERAL: 12.1
LV TDI E'MEDIAL: 7.43
LV e' LATERAL: 12.1 cm/s
LVEEAVG: 7.86
LVEEMED: 7.86
LVOT VTI: 18.8 cm
LVOT area: 2.01 cm2
LVOTD: 16 mm
LVOTPV: 102 cm/s
LVOTSV: 38 mL
Lateral S' vel: 10.6 cm/s
MV Dec: 222
MV Peak grad: 4 mmHg
MV pk A vel: 65 m/s
MVPKEVEL: 95.1 m/s
PW: 9.38 mm — AB (ref 0.6–1.1)
RV TAPSE: 20.8 mm
RV sys press: 23 mmHg
TR max vel: 222 cm/s
Weight: 2012.8 oz

## 2017-02-23 LAB — URINALYSIS, ROUTINE W REFLEX MICROSCOPIC
BILIRUBIN URINE: NEGATIVE
Bacteria, UA: NONE SEEN
GLUCOSE, UA: NEGATIVE mg/dL
KETONES UR: NEGATIVE mg/dL
LEUKOCYTES UA: NEGATIVE
NITRITE: NEGATIVE
PH: 6 (ref 5.0–8.0)
PROTEIN: NEGATIVE mg/dL
Specific Gravity, Urine: 1.011 (ref 1.005–1.030)

## 2017-02-23 LAB — CBC
HCT: 37.7 % (ref 36.0–46.0)
Hemoglobin: 12.9 g/dL (ref 12.0–15.0)
MCH: 32.3 pg (ref 26.0–34.0)
MCHC: 34.2 g/dL (ref 30.0–36.0)
MCV: 94.3 fL (ref 78.0–100.0)
PLATELETS: 128 10*3/uL — AB (ref 150–400)
RBC: 4 MIL/uL (ref 3.87–5.11)
RDW: 12.8 % (ref 11.5–15.5)
WBC: 7.3 10*3/uL (ref 4.0–10.5)

## 2017-02-23 LAB — HEPARIN LEVEL (UNFRACTIONATED)
Heparin Unfractionated: 1.18 IU/mL — ABNORMAL HIGH (ref 0.30–0.70)
Heparin Unfractionated: 0.92 IU/mL — ABNORMAL HIGH (ref 0.30–0.70)
Heparin Unfractionated: 0.8 IU/mL — ABNORMAL HIGH (ref 0.30–0.70)

## 2017-02-23 LAB — HIV ANTIBODY (ROUTINE TESTING W REFLEX): HIV SCREEN 4TH GENERATION: NONREACTIVE

## 2017-02-23 MED ORDER — VANCOMYCIN HCL IN DEXTROSE 1-5 GM/200ML-% IV SOLN
1000.0000 mg | INTRAVENOUS | Status: AC
  Administered 2017-02-24: 1000 mg via INTRAVENOUS
  Filled 2017-02-23 (×2): qty 200

## 2017-02-23 MED ORDER — HEPARIN (PORCINE) IN NACL 100-0.45 UNIT/ML-% IJ SOLN: 800.0000 [IU]/h | INTRAMUSCULAR | Status: DC

## 2017-02-23 MED ORDER — HEPARIN (PORCINE) IN NACL 100-0.45 UNIT/ML-% IJ SOLN
600.0000 [IU]/h | INTRAMUSCULAR | Status: DC
  Administered 2017-02-23: 650 [IU]/h via INTRAVENOUS
  Administered 2017-02-24: 600 [IU]/h via INTRAVENOUS
  Filled 2017-02-23: qty 250

## 2017-02-23 NOTE — Progress Notes (Signed)
ANTICOAGULATION CONSULT NOTE - Follow Up Consult  Pharmacy Consult for Heparin  Indication: Carotid thrombus  Patient Measurements: Height: 4\' 11"  (149.9 cm) Weight: 125 lb 12.8 oz (57.1 kg) IBW/kg (Calculated) : 43.2  Vital Signs: Temp: 98.2 F (36.8 C) (03/20 0454) Temp Source: Oral (03/20 0454) BP: 116/76 (03/20 0454) Pulse Rate: 80 (03/20 0454)  Labs:  Recent Labs  02/22/17 1047  02/22/17 2001 02/22/17 2021 02/23/17 0342  HGB  --   < > 13.8 12.9 12.9  HCT  --   --  39.8 38.0 37.7  PLT  --   --  125*  --  128*  LABPROT  --   --  11.9  --   --   INR  --   --  0.88  --   --   HEPARINUNFRC  --   --   --   --  1.18*  CREATININE 0.98  --  1.04* 0.90  --   < > = values in this interval not displayed.  Estimated Creatinine Clearance: 58.3 mL/min (by C-G formula based on SCr of 0.9 mg/dL).  Assessment: Heparin for carotid thrombus, initial heparin level is elevated at 1.18, confirmed with RN that level drawn from opposite arm of heparin infusion, no issues per RN.   Goal of Therapy:  Heparin level 0.3-0.7 units/ml Monitor platelets by anticoagulation protocol: Yes   Plan:  -Hold heparin x 1 hr -Re-start heparin at 800 units/hr at 0730 -1500 HL  Narda Bonds 02/23/2017,6:38 AM

## 2017-02-23 NOTE — Progress Notes (Signed)
ANTICOAGULATION CONSULT NOTE - FOLLOW UP    HL = 0.92 (goal 0.3 - 0.7 units/mL) Heparin dosing weight = 55 kg   Assessment: 57 YOM with left carotid artery thrombus to continue on IV heparin.  Heparin level remains supra-therapeutic despite rate reduction.  Confirmed with RN that lab was drawn appropriately.  No bleeding reported.   Plan: - Reduce heparin gtt to 650 units/hr - Check 6 hr heparin level    Ashley Beard, PharmD, BCPS 02/23/2017, 4:18 PM

## 2017-02-23 NOTE — Progress Notes (Signed)
Patient ID: Ashley Beard, female   DOB: [birth year], 51 y.o.   MRN: [id number] Stable this morning. On heparin drip. No new neurologic deficits.  For MRI of her brain and 2-D echocardiogram today to rule out cardiogenic source for her left carotid thrombus. Plan left carotid endarterectomy tomorrow. Discussed with the patient who understands

## 2017-02-23 NOTE — Progress Notes (Signed)
Echo completed, however, MRI is still pending.  I have spoken to the pt and plan to proceed with surgery tomorrow pending MRI results.  I have spoken to the pt and she understands.  She has no questions.  Npo after MN.   Leontine Locket, [facility] 02/23/2017 3:19 PM

## 2017-02-23 NOTE — Progress Notes (Addendum)
ANTICOAGULATION CONSULT NOTE Pharmacy Consult for Heparin  Indication: Carotid thrombus  Patient Measurements: Height: 4\' 11"  (149.9 cm) Weight: 125 lb 12.8 oz (57.1 kg) IBW/kg (Calculated) : 43.2  Vital Signs: Temp: 98.1 F (36.7 C) (03/20 2109) Temp Source: Oral (03/20 2109) BP: 113/57 (03/20 2109) Pulse Rate: 79 (03/20 2109)  Labs:  Recent Labs  02/22/17 1047  02/22/17 2001 02/22/17 2021 02/23/17 0342 02/23/17 1417 02/23/17 2253  HGB  --   < > 13.8 12.9 12.9  --   --   HCT  --   --  39.8 38.0 37.7  --   --   PLT  --   --  125*  --  128*  --   --   LABPROT  --   --  11.9  --   --   --   --   INR  --   --  0.88  --   --   --   --   HEPARINUNFRC  --   --   --   --  1.18* 0.92* 0.80*  CREATININE 0.98  --  1.04* 0.90  --   --   --   < > = values in this interval not displayed.  Estimated Creatinine Clearance: 58.3 mL/min (by C-G formula based on SCr of 0.9 mg/dL).  Assessment: 51 y.o. female with carotid thrombus, awaiting CEA and thrombectomy tomorrow, for heparin  Goal of Therapy:  Heparin level 0.3-0.7 units/ml Monitor platelets by anticoagulation protocol: Yes   Plan:  Decrease Heparin 600 units/hr F/U after surgery tomorrow morning   Ashley Beard, Ashley Beard 02/23/2017,11:45 PM

## 2017-02-23 NOTE — Progress Notes (Signed)
  Echocardiogram 2D Echocardiogram has been performed.  Tresa Res 02/23/2017, 11:02 AM

## 2017-02-24 ENCOUNTER — Inpatient Hospital Stay (HOSPITAL_COMMUNITY): Payer: BLUE CROSS/BLUE SHIELD | Admitting: Certified Registered Nurse Anesthetist

## 2017-02-24 ENCOUNTER — Encounter (HOSPITAL_COMMUNITY): Payer: Self-pay | Admitting: Certified Registered Nurse Anesthetist

## 2017-02-24 ENCOUNTER — Encounter (HOSPITAL_COMMUNITY)
Admission: EM | Disposition: A | Discharge status: Home / Self Care | Payer: Self-pay | Source: Home / Self Care | Attending: Vascular Surgery

## 2017-02-24 HISTORY — PX: ENDARTERECTOMY: SHX5162

## 2017-02-24 LAB — CBC
HCT: 39.3 % (ref 36.0–46.0)
Hemoglobin: 13.4 g/dL (ref 12.0–15.0)
MCH: 32.5 pg (ref 26.0–34.0)
MCHC: 34.1 g/dL (ref 30.0–36.0)
MCV: 95.4 fL (ref 78.0–100.0)
PLATELETS: 131 10*3/uL — AB (ref 150–400)
RBC: 4.12 MIL/uL (ref 3.87–5.11)
RDW: 12.7 % (ref 11.5–15.5)
WBC: 7.5 10*3/uL (ref 4.0–10.5)

## 2017-02-24 LAB — SURGICAL PCR SCREEN
MRSA, PCR: NEGATIVE
Staphylococcus aureus: NEGATIVE

## 2017-02-24 LAB — BASIC METABOLIC PANEL
ANION GAP: 8 (ref 5–15)
BUN: 7 mg/dL (ref 6–20)
CALCIUM: 8.9 mg/dL (ref 8.9–10.3)
CO2: 30 mmol/L (ref 22–32)
Chloride: 101 mmol/L (ref 101–111)
Creatinine, Ser: 0.94 mg/dL (ref 0.44–1.00)
GFR calc Af Amer: >60 mL/min (ref >60)
GLUCOSE: 91 mg/dL (ref 65–99)
Potassium: 3.9 mmol/L (ref 3.5–5.1)
Sodium: 139 mmol/L (ref 135–145)

## 2017-02-24 SURGERY — ENDARTERECTOMY, CAROTID
Anesthesia: General | Site: Neck | Laterality: Left

## 2017-02-24 MED ORDER — REMIFENTANIL HCL 1 MG IV SOLR
0.0125 ug/kg/min | INTRAVENOUS | Status: AC
Start: 2017-02-24 — End: 2017-02-24
  Administered 2017-02-24: .2 ug/kg/min via INTRAVENOUS
  Filled 2017-02-24 (×2): qty 2000

## 2017-02-24 MED ORDER — DOCUSATE SODIUM 100 MG PO CAPS
100.0000 mg | ORAL_CAPSULE | Freq: Every day | ORAL | Status: DC
  Administered 2017-02-26 – 2017-03-02 (×5): 100 mg via ORAL
  Filled 2017-02-24 (×6): qty 1

## 2017-02-24 MED ORDER — SODIUM CHLORIDE 0.9 % IV SOLN
INTRAVENOUS | Status: DC | PRN
  Administered 2017-02-24: 500 mL

## 2017-02-24 MED ORDER — HYDRALAZINE HCL 20 MG/ML IJ SOLN: 5.0000 mg | INTRAMUSCULAR | Status: DC | PRN

## 2017-02-24 MED ORDER — PROPOFOL 10 MG/ML IV BOLUS
INTRAVENOUS | Status: AC
  Filled 2017-02-24: qty 40

## 2017-02-24 MED ORDER — PHENYLEPHRINE 40 MCG/ML (10ML) SYRINGE FOR IV PUSH (FOR BLOOD PRESSURE SUPPORT)
PREFILLED_SYRINGE | INTRAVENOUS | Status: AC
  Filled 2017-02-24: qty 10

## 2017-02-24 MED ORDER — 0.9 % SODIUM CHLORIDE (POUR BTL) OPTIME
TOPICAL | Status: DC | PRN
  Administered 2017-02-24: 1000 mL

## 2017-02-24 MED ORDER — PROTAMINE SULFATE 10 MG/ML IV SOLN
INTRAVENOUS | Status: DC | PRN
  Administered 2017-02-24: 50 mg via INTRAVENOUS

## 2017-02-24 MED ORDER — HEPARIN SODIUM (PORCINE) 1000 UNIT/ML IJ SOLN
INTRAMUSCULAR | Status: AC
  Filled 2017-02-24: qty 1

## 2017-02-24 MED ORDER — POLYETHYLENE GLYCOL 3350 17 G PO PACK
17.0000 g | PACK | Freq: Every day | ORAL | Status: DC | PRN
  Administered 2017-03-01: 17 g via ORAL
  Filled 2017-02-24: qty 1

## 2017-02-24 MED ORDER — ONDANSETRON HCL 4 MG/2ML IJ SOLN
INTRAMUSCULAR | Status: DC | PRN
Start: 2017-02-24 — End: 2017-02-24
  Administered 2017-02-24: 4 mg via INTRAVENOUS

## 2017-02-24 MED ORDER — SODIUM CHLORIDE 0.9 % IV SOLN
500.0000 mL | Freq: Once | INTRAVENOUS | Status: AC | PRN
  Administered 2017-02-25: 500 mL via INTRAVENOUS

## 2017-02-24 MED ORDER — PHENYLEPHRINE 40 MCG/ML (10ML) SYRINGE FOR IV PUSH (FOR BLOOD PRESSURE SUPPORT)
PREFILLED_SYRINGE | INTRAVENOUS | Status: DC | PRN
  Administered 2017-02-24 (×2): 80 ug via INTRAVENOUS

## 2017-02-24 MED ORDER — LIDOCAINE 2% (20 MG/ML) 5 ML SYRINGE
INTRAMUSCULAR | Status: DC | PRN
  Administered 2017-02-24: 60 mg via INTRAVENOUS

## 2017-02-24 MED ORDER — GUAIFENESIN-DM 100-10 MG/5ML PO SYRP: 15.0000 mL | ORAL_SOLUTION | ORAL | Status: DC | PRN

## 2017-02-24 MED ORDER — VANCOMYCIN HCL IN DEXTROSE 1-5 GM/200ML-% IV SOLN
1000.0000 mg | Freq: Two times a day (BID) | INTRAVENOUS | Status: AC
  Administered 2017-02-24: 1000 mg via INTRAVENOUS
  Filled 2017-02-24 (×2): qty 200

## 2017-02-24 MED ORDER — SUGAMMADEX SODIUM 200 MG/2ML IV SOLN
INTRAVENOUS | Status: AC
  Filled 2017-02-24: qty 2

## 2017-02-24 MED ORDER — ROCURONIUM BROMIDE 50 MG/5ML IV SOSY
PREFILLED_SYRINGE | INTRAVENOUS | Status: AC
  Filled 2017-02-24: qty 5

## 2017-02-24 MED ORDER — MIDAZOLAM HCL 5 MG/5ML IJ SOLN
INTRAMUSCULAR | Status: DC | PRN
  Administered 2017-02-24: 2 mg via INTRAVENOUS

## 2017-02-24 MED ORDER — ASPIRIN EC 325 MG PO TBEC
325.0000 mg | DELAYED_RELEASE_TABLET | Freq: Every day | ORAL | Status: DC
  Administered 2017-02-26 – 2017-03-02 (×5): 325 mg via ORAL
  Filled 2017-02-24 (×6): qty 1

## 2017-02-24 MED ORDER — METOPROLOL TARTRATE 5 MG/5ML IV SOLN: 2.0000 mg | INTRAVENOUS | Status: DC | PRN

## 2017-02-24 MED ORDER — ACETAMINOPHEN 325 MG PO TABS
325.0000 mg | ORAL_TABLET | ORAL | Status: DC | PRN
  Administered 2017-02-27 – 2017-03-01 (×2): 650 mg via ORAL
  Filled 2017-02-24 (×3): qty 2

## 2017-02-24 MED ORDER — ROCURONIUM BROMIDE 10 MG/ML (PF) SYRINGE
PREFILLED_SYRINGE | INTRAVENOUS | Status: DC | PRN
  Administered 2017-02-24 (×2): 50 mg via INTRAVENOUS

## 2017-02-24 MED ORDER — SODIUM CHLORIDE 0.9 % IV SOLN
INTRAVENOUS | Status: DC
  Administered 2017-02-24: 75 mL/h via INTRAVENOUS

## 2017-02-24 MED ORDER — ONDANSETRON HCL 4 MG/2ML IJ SOLN
4.0000 mg | Freq: Four times a day (QID) | INTRAMUSCULAR | Status: DC | PRN
  Filled 2017-02-24: qty 2

## 2017-02-24 MED ORDER — OXYCODONE-ACETAMINOPHEN 5-325 MG PO TABS
1.0000 | ORAL_TABLET | ORAL | Status: DC | PRN
  Administered 2017-02-26 (×2): 2 via ORAL
  Filled 2017-02-24 (×2): qty 2

## 2017-02-24 MED ORDER — PHENOL 1.4 % MT LIQD: 1.0000 | OROMUCOSAL | Status: DC | PRN

## 2017-02-24 MED ORDER — LABETALOL HCL 5 MG/ML IV SOLN: 10.0000 mg | INTRAVENOUS | Status: DC | PRN

## 2017-02-24 MED ORDER — FENTANYL CITRATE (PF) 100 MCG/2ML IJ SOLN
INTRAMUSCULAR | Status: DC | PRN
  Administered 2017-02-24 (×2): 50 ug via INTRAVENOUS

## 2017-02-24 MED ORDER — BISACODYL 10 MG RE SUPP: 10.0000 mg | Freq: Every day | RECTAL | Status: DC | PRN

## 2017-02-24 MED ORDER — LIDOCAINE 2% (20 MG/ML) 5 ML SYRINGE
INTRAMUSCULAR | Status: AC
  Filled 2017-02-24: qty 5

## 2017-02-24 MED ORDER — PROTAMINE SULFATE 10 MG/ML IV SOLN
INTRAVENOUS | Status: AC
  Filled 2017-02-24: qty 5

## 2017-02-24 MED ORDER — PHENYLEPHRINE HCL 10 MG/ML IJ SOLN
INTRAVENOUS | Status: DC | PRN
  Administered 2017-02-24: 25 ug/min via INTRAVENOUS

## 2017-02-24 MED ORDER — POTASSIUM CHLORIDE CRYS ER 20 MEQ PO TBCR: 20.0000 meq | EXTENDED_RELEASE_TABLET | Freq: Every day | ORAL | Status: DC | PRN

## 2017-02-24 MED ORDER — FENTANYL CITRATE (PF) 100 MCG/2ML IJ SOLN
INTRAMUSCULAR | Status: AC
  Filled 2017-02-24: qty 4

## 2017-02-24 MED ORDER — PROPOFOL 10 MG/ML IV BOLUS
INTRAVENOUS | Status: DC | PRN
  Administered 2017-02-24: 160 mg via INTRAVENOUS

## 2017-02-24 MED ORDER — LACTATED RINGERS IV SOLN
INTRAVENOUS | Status: DC | PRN
  Administered 2017-02-24 (×2): via INTRAVENOUS

## 2017-02-24 MED ORDER — PANTOPRAZOLE SODIUM 40 MG PO TBEC
40.0000 mg | DELAYED_RELEASE_TABLET | Freq: Every day | ORAL | Status: DC
  Administered 2017-02-26 – 2017-03-02 (×5): 40 mg via ORAL
  Filled 2017-02-24 (×6): qty 1

## 2017-02-24 MED ORDER — MIDAZOLAM HCL 2 MG/2ML IJ SOLN
INTRAMUSCULAR | Status: AC
  Filled 2017-02-24: qty 2

## 2017-02-24 MED ORDER — MAGNESIUM SULFATE 2 GM/50ML IV SOLN
2.0000 g | Freq: Every day | INTRAVENOUS | Status: DC | PRN
  Filled 2017-02-24: qty 50

## 2017-02-24 MED ORDER — ACETAMINOPHEN 650 MG RE SUPP: 325.0000 mg | RECTAL | Status: DC | PRN

## 2017-02-24 MED ORDER — HYDROMORPHONE HCL 1 MG/ML IJ SOLN
INTRAMUSCULAR | Status: AC
  Filled 2017-02-24: qty 0.5

## 2017-02-24 MED ORDER — HEPARIN SODIUM (PORCINE) 1000 UNIT/ML IJ SOLN
INTRAMUSCULAR | Status: DC | PRN
  Administered 2017-02-24: 6 mL via INTRAVENOUS

## 2017-02-24 MED ORDER — LIDOCAINE HCL (PF) 1 % IJ SOLN
INTRAMUSCULAR | Status: AC
  Filled 2017-02-24: qty 30

## 2017-02-24 MED ORDER — PROMETHAZINE HCL 25 MG/ML IJ SOLN: 6.2500 mg | INTRAMUSCULAR | Status: DC | PRN

## 2017-02-24 MED ORDER — ENOXAPARIN SODIUM 30 MG/0.3ML ~~LOC~~ SOLN: 30.0000 mg | SUBCUTANEOUS | Status: DC

## 2017-02-24 MED ORDER — MORPHINE SULFATE (PF) 2 MG/ML IV SOLN
2.0000 mg | INTRAVENOUS | Status: DC | PRN
  Administered 2017-02-24 – 2017-02-25 (×2): 2 mg via INTRAVENOUS
  Filled 2017-02-24 (×2): qty 1

## 2017-02-24 MED ORDER — HYDROMORPHONE HCL 1 MG/ML IJ SOLN
0.2500 mg | INTRAMUSCULAR | Status: DC | PRN
  Administered 2017-02-24: 0.5 mg via INTRAVENOUS

## 2017-02-24 MED ORDER — SUGAMMADEX SODIUM 200 MG/2ML IV SOLN
INTRAVENOUS | Status: DC | PRN
  Administered 2017-02-24: 200 mg via INTRAVENOUS

## 2017-02-24 SURGICAL SUPPLY — 44 items
CANISTER SUCT 3000ML PPV (MISCELLANEOUS) ×2 IMPLANT
CANNULA VESSEL 3MM 2 BLNT TIP (CANNULA) ×4 IMPLANT
CATH ROBINSON RED A/P 18FR (CATHETERS) ×2 IMPLANT
CLIP LIGATING EXTRA MED SLVR (CLIP) ×2 IMPLANT
CLIP LIGATING EXTRA SM BLUE (MISCELLANEOUS) ×2 IMPLANT
CONT SPECI 4OZ STER CLIK (MISCELLANEOUS) ×8 IMPLANT
CRADLE DONUT ADULT HEAD (MISCELLANEOUS) ×2 IMPLANT
DECANTER SPIKE VIAL GLASS SM (MISCELLANEOUS) IMPLANT
DERMABOND ADVANCED (GAUZE/BANDAGES/DRESSINGS) ×1
DERMABOND ADVANCED .7 DNX12 (GAUZE/BANDAGES/DRESSINGS) ×1 IMPLANT
DRAIN HEMOVAC 1/8 X 5 (WOUND CARE) IMPLANT
ELECT REM PT RETURN 9FT ADLT (ELECTROSURGICAL) ×2
ELECTRODE REM PT RTRN 9FT ADLT (ELECTROSURGICAL) ×1 IMPLANT
EVACUATOR SILICONE 100CC (DRAIN) IMPLANT
GLOVE BIO SURGEON STRL SZ 6.5 (GLOVE) ×4 IMPLANT
GLOVE BIOGEL PI IND STRL 6 (GLOVE) ×1 IMPLANT
GLOVE BIOGEL PI IND STRL 6.5 (GLOVE) ×3 IMPLANT
GLOVE BIOGEL PI INDICATOR 6 (GLOVE) ×1
GLOVE BIOGEL PI INDICATOR 6.5 (GLOVE) ×3
GLOVE ECLIPSE 6.5 STRL STRAW (GLOVE) ×4 IMPLANT
GLOVE SS BIOGEL STRL SZ 7.5 (GLOVE) ×1 IMPLANT
GLOVE SUPERSENSE BIOGEL SZ 7.5 (GLOVE) ×1
GLOVE SURG SS PI 6.5 STRL IVOR (GLOVE) ×2 IMPLANT
GOWN STRL REUS W/ TWL LRG LVL3 (GOWN DISPOSABLE) ×4 IMPLANT
GOWN STRL REUS W/TWL LRG LVL3 (GOWN DISPOSABLE) ×4
KIT BASIN OR (CUSTOM PROCEDURE TRAY) ×2 IMPLANT
KIT ROOM TURNOVER OR (KITS) ×2 IMPLANT
KIT SHUNT ARGYLE CAROTID ART 6 (VASCULAR PRODUCTS) ×2 IMPLANT
NEEDLE 22X1 1/2 (OR ONLY) (NEEDLE) IMPLANT
NS IRRIG 1000ML POUR BTL (IV SOLUTION) ×4 IMPLANT
PACK CAROTID (CUSTOM PROCEDURE TRAY) ×2 IMPLANT
PAD ARMBOARD 7.5X6 YLW CONV (MISCELLANEOUS) ×4 IMPLANT
PATCH HEMASHIELD 8X75 (Vascular Products) ×2 IMPLANT
SHUNT CAROTID BYPASS 10 (VASCULAR PRODUCTS) IMPLANT
SHUNT CAROTID BYPASS 12FRX15.5 (VASCULAR PRODUCTS) IMPLANT
SUT ETHILON 3 0 PS 1 (SUTURE) IMPLANT
SUT PROLENE 6 0 CC (SUTURE) ×2 IMPLANT
SUT SILK 3 0 (SUTURE)
SUT SILK 3-0 18XBRD TIE 12 (SUTURE) IMPLANT
SUT VIC AB 3-0 SH 27 (SUTURE) ×2
SUT VIC AB 3-0 SH 27X BRD (SUTURE) ×2 IMPLANT
SUT VICRYL 4-0 PS2 18IN ABS (SUTURE) ×2 IMPLANT
SYR CONTROL 10ML LL (SYRINGE) IMPLANT
WATER STERILE IRR 1000ML POUR (IV SOLUTION) ×2 IMPLANT

## 2017-02-24 NOTE — Anesthesia Preprocedure Evaluation (Signed)
Anesthesia Evaluation  Patient identified by MRN, date of birth, ID band Patient awake    Reviewed: Allergy & Precautions, NPO status , Patient's Chart, lab work & pertinent test results  Airway Mallampati: II  TM Distance: >3 FB Neck ROM: Full    Dental no notable dental hx.    Pulmonary neg pulmonary ROS, former smoker,    Pulmonary exam normal breath sounds clear to auscultation       Cardiovascular + Peripheral Vascular Disease  Normal cardiovascular exam Rhythm:Regular Rate:Normal  Left ventricle: The cavity size was normal. Wall thickness was   normal. Systolic function was normal. The estimated ejection   fraction was in the range of 60% to 65%. Wall motion was normal;   there were no regional wall motion abnormalities. Left   ventricular diastolic function parameters were normal. - Aortic valve: Mildly calcified annulus.   Neuro/Psych negative neurological ROS  negative psych ROS   GI/Hepatic negative GI ROS, Neg liver ROS,   Endo/Other  negative endocrine ROS  Renal/GU negative Renal ROS  negative genitourinary   Musculoskeletal negative musculoskeletal ROS (+)   Abdominal   Peds negative pediatric ROS (+)  Hematology negative hematology ROS (+)   Anesthesia Other Findings   Reproductive/Obstetrics negative OB ROS                             Anesthesia Physical Anesthesia Plan  ASA: III  Anesthesia Plan: General   Post-op Pain Management:    Induction: Intravenous  Airway Management Planned: Oral ETT  Additional Equipment: Arterial line  Intra-op Plan:   Post-operative Plan: Extubation in OR  Informed Consent: I have reviewed the patients History and Physical, chart, labs and discussed the procedure including the risks, benefits and alternatives for the proposed anesthesia with the patient or authorized representative who has indicated his/her understanding and  acceptance.   Dental advisory given  Plan Discussed with: CRNA and Surgeon  Anesthesia Plan Comments:         Anesthesia Quick Evaluation

## 2017-02-24 NOTE — Anesthesia Postprocedure Evaluation (Signed)
Anesthesia Post Note  Patient: Ashley Beard  Procedure(s) Performed: Procedure(s) (LRB): ENDARTERECTOMY CAROTID with removal of thrombus  (Left)  Patient location during evaluation: PACU Anesthesia Type: General Level of consciousness: awake and alert Pain management: pain level controlled Vital Signs Assessment: post-procedure vital signs reviewed and stable Respiratory status: spontaneous breathing, nonlabored ventilation, respiratory function stable and patient connected to nasal cannula oxygen Cardiovascular status: blood pressure returned to baseline and stable Postop Assessment: no signs of nausea or vomiting Anesthetic complications: no       Last Vitals:  Vitals:   02/24/17 1345 02/24/17 1400  BP:    Pulse: 68 61  Resp: 10 (!) 9  Temp:      Last Pain:  Vitals:   02/24/17 1400  TempSrc:   PainSc: Asleep                 Amulya Quintin S

## 2017-02-24 NOTE — Interval H&P Note (Signed)
History and Physical Interval Note:  02/24/2017 10:48 AM  Earna Coder  has presented today for surgery, with the diagnosis of Left carotid artery stenosis I65.22  The various methods of treatment have been discussed with the patient and family. After consideration of risks, benefits and other options for treatment, the patient has consented to  Procedure(s): ENDARTERECTOMY CAROTID (Left) as a surgical intervention .  The patient's history has been reviewed, patient examined, no change in status, stable for surgery.  I have reviewed the patient's chart and labs.  Questions were answered to the patient's satisfaction.     Curt Jews

## 2017-02-24 NOTE — Transfer of Care (Signed)
Immediate Anesthesia Transfer of Care Note  Patient: Ashley Beard  Procedure(s) Performed: Procedure(s): ENDARTERECTOMY CAROTID with removal of thrombus  (Left)  Patient Location: PACU  Anesthesia Type:General  Level of Consciousness: awake, alert , oriented and patient cooperative  Airway & Oxygen Therapy: Patient Spontanous Breathing and Patient connected to nasal cannula oxygen  Post-op Assessment: Report given to RN, Post -op Vital signs reviewed and stable, Patient moving all extremities X 4 and Patient able to stick tongue midline  Post vital signs: Reviewed and stable  Last Vitals:  Vitals:   02/24/17 0440 02/24/17 1012  BP: 118/80 124/80  Pulse: 72 88  Resp: 18 14  Temp: 36.8 C 36.8 C    Last Pain:  Vitals:   02/24/17 1012  TempSrc: Oral  PainSc:          Complications: No apparent anesthesia complications

## 2017-02-24 NOTE — Op Note (Signed)
    OPERATIVE REPORT  DATE OF SURGERY: 02/24/2017  PATIENT: Ashley Beard, 51 y.o. 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  PHYSICIAN ASSISTANT: Samantha Rhyne PA-C  ANESTHESIA:  Gen.  EBL: Minimal ml  Total I/O In: 1200 [I.V.:1200] Out: 475 [Urine:450; Blood:25]  BLOOD ADMINISTERED: None  DRAINS: None  SPECIMEN: 2 lymph nodes from around the carotid artery and also the thrombus and plaque from the carotid artery  COUNTS CORRECT:  YES  PLAN OF CARE: PACU neurologically intact   PATIENT DISPOSITION:  PACU - hemodynamically stable  PROCEDURE DETAILS: The patient was taken to the operative placed supine position where the area of the left neck was prepped in sterile fashion. Incision was made Anterior to the sternocleidomastoid and carried down to the platysma with electrocautery. Great care was taken to minimize the mobilization of the carotid arteries due to the known thrombus in the carotid bifurcation. The internal carotid was encircled with a umbilical tape and Rummel tourniquet. The external carotid was encircled with a blue vessel loop and the common carotid artery was encircled with an umbilical tape and Rummel tourniquet. The hypoglossal and vagus nerves were identified and preserved. The patient was given 6000 units intravenous heparin and after adequate circulation time the internal/external and common carotid arteries were occluded. The common carotid artery was opened with 11 blade and sent onto the internal carotid artery. There was a tannish pink of thrombus in the bifurcation. This was removed and sent for pathology. There was minimal plaque in the bifurcation itself. The thrombus did extend into the external carotid artery. There is  no thrombus in the internal carotid artery. There was good backbleeding from the internal and external carotid artery and for bleeding from the common carotid artery. A 8 shunt was passed up the internal carotid and allowed to back bleed and then placed down the common carotid were secured with Rummel tourniquet. Endarterectomies again on the common carotid artery and the plaque was divided proximally with Potts scissors. The endarterectomy scheduled to the bifurcation. The external carotid was endarterectomized with an eversion technique and the internal carotid was endarterectomized in an open fashion. Remaining atheromatous debris was removed from the endarterectomy plane. The mass Hemashield Dacron patch was brought onto the field and was sewn as a patch angioplasty with a running 6-0 Prolene suture. Prior to completion of the closure the shunt was removed and the usual flushing maneuvers were undertaken. Anastomosis completed and flow was restored first to the external and then the internal carotid artery. Excellent flow characteristics were noted with hand-held Doppler in the internal and external carotid arteries. The patient was given 50 mg of protamine to reverse heparin. Irrigated with saline. Hemostasis tablet cautery. Wounds were closed with 3-0 Vicryl to reapproximate sternocleidomastoid and the carotid sheath. Next the platysma was closed with running 3-0 Vicryl suture and find the skin was closed with a 4 subcuticular Vicryl stitch. Sterile dressing was applied the patient was awakened neurologically intact in the operating room and transferred to the recovery room in stable condition   Ashley Beard, M.D., [facility] 02/24/2017 3:46 PM

## 2017-02-24 NOTE — Progress Notes (Signed)
  Day of Surgery Note    Subjective:  Sleeping and awakes easily to voice.  No complaints  Vitals:   02/24/17 1330 02/24/17 1345  BP: 108/78   Pulse: 78 68  Resp: 16 10  Temp: 97 F (36.1 C)     Incisions:   Clean and dry Cardiac:  regular Lungs:  Non labored Neuro:  In tact; tongue midline   Assessment/Plan:  This is a 51 y.o. female who is s/p left carotid endarterectomy  -pt doing well in pacu -to [facility] when bed available -heparin discontinued -start aspirin tomorrow   Leontine Locket, PA-C 02/24/2017 2:00 PM [phone]

## 2017-02-24 NOTE — Anesthesia Procedure Notes (Signed)
Procedure Name: Intubation Date/Time: 02/24/2017 11:28 AM Performed by: Everlean Cherry A Pre-anesthesia Checklist: Patient identified, Emergency Drugs available, Suction available and Patient being monitored Patient Re-evaluated:Patient Re-evaluated prior to inductionOxygen Delivery Method: Circle system utilized Preoxygenation: Pre-oxygenation with 100% oxygen Intubation Type: IV induction Ventilation: Mask ventilation without difficulty Laryngoscope Size: Miller and 2 Grade View: Grade II Tube type: Oral Tube size: 7.5 mm Number of attempts: 1 Airway Equipment and Method: Stylet Placement Confirmation: ETT inserted through vocal cords under direct vision,  positive ETCO2 and breath sounds checked- equal and bilateral Secured at: 22 cm Tube secured with: Tape Dental Injury: Teeth and Oropharynx as per pre-operative assessment

## 2017-02-25 ENCOUNTER — Encounter (HOSPITAL_COMMUNITY): Payer: Self-pay | Admitting: Vascular Surgery

## 2017-02-25 ENCOUNTER — Encounter (INDEPENDENT_AMBULATORY_CARE_PROVIDER_SITE_OTHER): Payer: BLUE CROSS/BLUE SHIELD

## 2017-02-25 ENCOUNTER — Telehealth: Payer: Self-pay | Admitting: Vascular Surgery

## 2017-02-25 ENCOUNTER — Inpatient Hospital Stay (HOSPITAL_COMMUNITY): Payer: BLUE CROSS/BLUE SHIELD

## 2017-02-25 DIAGNOSIS — R202 Paresthesia of skin: Secondary | ICD-10-CM

## 2017-02-25 DIAGNOSIS — Z9889 Other specified postprocedural states: Secondary | ICD-10-CM

## 2017-02-25 DIAGNOSIS — R4702 Dysphasia: Secondary | ICD-10-CM

## 2017-02-25 DIAGNOSIS — H53461 Homonymous bilateral field defects, right side: Secondary | ICD-10-CM

## 2017-02-25 DIAGNOSIS — I779 Disorder of arteries and arterioles, unspecified: Secondary | ICD-10-CM

## 2017-02-25 DIAGNOSIS — I63522 Cerebral infarction due to unspecified occlusion or stenosis of left anterior cerebral artery: Secondary | ICD-10-CM

## 2017-02-25 HISTORY — PX: IR GENERIC HISTORICAL: IMG1180011

## 2017-02-25 LAB — BASIC METABOLIC PANEL
Anion gap: 11 (ref 5–15)
BUN: 6 mg/dL (ref 6–20)
CHLORIDE: 102 mmol/L (ref 101–111)
CO2: 23 mmol/L (ref 22–32)
CREATININE: 0.72 mg/dL (ref 0.44–1.00)
Calcium: 8.4 mg/dL — ABNORMAL LOW (ref 8.9–10.3)
GFR calc Af Amer: >60 mL/min (ref >60)
GFR calc non Af Amer: >60 mL/min (ref >60)
GLUCOSE: 110 mg/dL — AB (ref 65–99)
POTASSIUM: 4 mmol/L (ref 3.5–5.1)
Sodium: 136 mmol/L (ref 135–145)

## 2017-02-25 LAB — VAS US CAROTID
LCCADSYS: -71 cm/s
LEFT ECA DIAS: -16 cm/s
LEFT VERTEBRAL DIAS: 14 cm/s
LICAPDIAS: -18 cm/s
Left CCA dist dias: -27 cm/s
Left CCA prox dias: 30 cm/s
Left CCA prox sys: 104 cm/s
Left ICA dist dias: -125 cm/s
Left ICA dist sys: -311 cm/s
Left ICA prox sys: -54 cm/s
RCCADSYS: -105 cm/s
RCCAPDIAS: 22 cm/s
RIGHT ECA DIAS: -18 cm/s
RIGHT VERTEBRAL DIAS: 21 cm/s
Right CCA prox sys: 103 cm/s

## 2017-02-25 LAB — CBC
HCT: 35.9 % — ABNORMAL LOW (ref 36.0–46.0)
HEMOGLOBIN: 12.2 g/dL (ref 12.0–15.0)
MCH: 32.4 pg (ref 26.0–34.0)
MCHC: 34 g/dL (ref 30.0–36.0)
MCV: 95.2 fL (ref 78.0–100.0)
Platelets: 96 10*3/uL — ABNORMAL LOW (ref 150–400)
RBC: 3.77 MIL/uL — AB (ref 3.87–5.11)
RDW: 12.5 % (ref 11.5–15.5)
WBC: 12 10*3/uL — ABNORMAL HIGH (ref 4.0–10.5)

## 2017-02-25 LAB — HEPARIN LEVEL (UNFRACTIONATED): HEPARIN UNFRACTIONATED: 0.16 [IU]/mL — AB (ref 0.30–0.70)

## 2017-02-25 MED ORDER — DIPHENHYDRAMINE HCL 50 MG/ML IJ SOLN
INTRAMUSCULAR | Status: AC
  Filled 2017-02-25: qty 1

## 2017-02-25 MED ORDER — MIDAZOLAM HCL 2 MG/2ML IJ SOLN
INTRAMUSCULAR | Status: AC
  Filled 2017-02-25: qty 2

## 2017-02-25 MED ORDER — DIPHENHYDRAMINE HCL 50 MG/ML IJ SOLN
INTRAMUSCULAR | Status: AC | PRN
  Administered 2017-02-25: 50 mg via INTRAVENOUS

## 2017-02-25 MED ORDER — HEPARIN SODIUM (PORCINE) 1000 UNIT/ML IJ SOLN
INTRAMUSCULAR | Status: AC
  Filled 2017-02-25: qty 2

## 2017-02-25 MED ORDER — SODIUM CHLORIDE 0.9 % IV SOLN
INTRAVENOUS | Status: AC
  Administered 2017-02-25: 13:00:00 via INTRAVENOUS

## 2017-02-25 MED ORDER — LIDOCAINE HCL 1 % IJ SOLN
INTRAMUSCULAR | Status: AC | PRN
  Administered 2017-02-25: 20 mL

## 2017-02-25 MED ORDER — GADOBENATE DIMEGLUMINE 529 MG/ML IV SOLN
10.0000 mL | Freq: Once | INTRAVENOUS | Status: AC
  Administered 2017-02-25: 10 mL via INTRAVENOUS

## 2017-02-25 MED ORDER — LIDOCAINE HCL 1 % IJ SOLN
INTRAMUSCULAR | Status: AC
  Filled 2017-02-25: qty 20

## 2017-02-25 MED ORDER — STROKE: EARLY STAGES OF RECOVERY BOOK
Freq: Once | Status: DC
  Filled 2017-02-25: qty 1

## 2017-02-25 MED ORDER — FENTANYL CITRATE (PF) 100 MCG/2ML IJ SOLN
INTRAMUSCULAR | Status: AC
  Filled 2017-02-25: qty 2

## 2017-02-25 MED ORDER — HEPARIN (PORCINE) IN NACL 100-0.45 UNIT/ML-% IJ SOLN
550.0000 [IU]/h | INTRAMUSCULAR | Status: DC
  Administered 2017-02-25: 500 [IU]/h via INTRAVENOUS
  Filled 2017-02-25: qty 250

## 2017-02-25 MED ORDER — IOPAMIDOL (ISOVUE-300) INJECTION 61%
INTRAVENOUS | Status: AC
  Administered 2017-02-25: 60 mL
  Filled 2017-02-25: qty 150

## 2017-02-25 MED ORDER — FENTANYL CITRATE (PF) 100 MCG/2ML IJ SOLN
INTRAMUSCULAR | Status: AC | PRN
  Administered 2017-02-25: 25 ug via INTRAVENOUS

## 2017-02-25 NOTE — Progress Notes (Signed)
*  PRELIMINARY RESULTS* Vascular Ultrasound Carotid Duplex (Doppler) has been completed.   Findings suggest 1-39% right internal carotid artery stenosis. The distal left internal carotid artery exhibits elevated velocities suggestive of 80-99% stenosis; suggestive of possible focal plaque versus clamp injury. Vertebral arteries are patent with antegrade flow.  02/25/2017 9:48 AM Maudry Mayhew, BS, RVT, RDCS, RDMS

## 2017-02-25 NOTE — Progress Notes (Addendum)
Progress Note    02/25/2017 7:27 AM 1 Day Post-Op  Subjective:  No complaints; difficult getting her to answer questions.  RN states that all extremities are equal and tongue midline, however, when she walks, it is like she doesn't see objects to move around them.    afebrile HR  50's-70's NSR/SB 606'T-016'W systolic 109% RA  Vitals:   02/24/17 2349 02/25/17 0355  BP: 107/71 140/85  Pulse: 77 (!) 56  Resp: 12 19  Temp: 98.5 F (36.9 C) 98.4 F (36.9 C)     Physical Exam: Neuro:  Moving all extremities equally; tongue is midline; she is having difficulty with the finger to nose test; 1st time reading sentences on NIH scale, she only read the left side of the words; 2nd time, she did eventually finish the sentences, but with great hesitation Lungs:  CTAB Incision:  Clean and dry without hematoma  CBC    Component Value Date/Time   WBC 12.0 (H) 02/25/2017 0416   RBC 3.77 (L) 02/25/2017 0416   HGB 12.2 02/25/2017 0416   HCT 35.9 (L) 02/25/2017 0416   PLT 96 (L) 02/25/2017 0416   MCV 95.2 02/25/2017 0416   MCH 32.4 02/25/2017 0416   MCHC 34.0 02/25/2017 0416   RDW 12.5 02/25/2017 0416   LYMPHSABS 2.8 02/22/2017 2001   MONOABS 0.8 02/22/2017 2001   EOSABS 0.2 02/22/2017 2001   BASOSABS 0.0 02/22/2017 2001    BMET    Component Value Date/Time   NA 136 02/25/2017 0416   K 4.0 02/25/2017 0416   CL 102 02/25/2017 0416   CO2 23 02/25/2017 0416   GLUCOSE 110 (H) 02/25/2017 0416   BUN 6 02/25/2017 0416   CREATININE 0.72 02/25/2017 0416   CREATININE 0.98 02/22/2017 1047   CALCIUM 8.4 (L) 02/25/2017 0416   GFRNONAA >60 02/25/2017 0416   GFRNONAA 68 02/22/2017 1047   GFRAA >60 02/25/2017 0416   GFRAA 78 02/22/2017 1047     Intake/Output Summary (Last 24 hours) at 02/25/17 0727 Last data filed at 02/25/17 0400  Gross per 24 hour  Intake           2112.5 ml  Output             1175 ml  Net            937.5 ml     Assessment/Plan:  This is a 51 y.o. female who  is s/p left CEA 1 Day Post-Op  -pt seen this morning and is having right sided neglect.  She is having difficulty reading sentences and neglects the right side of the sentences.  She is ambulating however, she does not see objects to move away from them.  -Dr. Donnetta Hutching has spoken with Dr. Shon Hale with neurology and a stat CT head without contrast has been ordered.  A carotid duplex has also been ordered stat and will get this after her head CT.  I have spoken to the vascular lab.   Leontine Locket, PA-C [facility] [phone]  I have examined the patient, reviewed and agree with above. Clearly not at her preoperative baseline. Was neurologically intact in the recovery room following surgery. This morning having some difficulty with word searching. These is able to answer questions and can read from a chart. Right hand seems clumsy. Was up walking with the nurse earlier not normally. Discussed with Dr.Oster for neurologic consultation. He recommended stat CT scan of her head. 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  Repeat carotid duplex pending. Will start IV heparin anticoagulation. No evidence of bleed on CT of her head  Curt Jews, MD 02/25/2017 9:04 AM

## 2017-02-25 NOTE — Plan of Care (Signed)
Problem: Education: Goal: Understanding of discharge needs will improve Outcome: Progressing Have spoke with pt about plan of care regarding d/c process

## 2017-02-25 NOTE — Plan of Care (Signed)
Problem: Activity: Goal: Ability to return to normal activity level will improve Outcome: Progressing Patient had a stroke post surgery and is needing pt/ot and st.   Problem: Physical Regulation: Goal: Postoperative complications will be avoided or minimized Outcome: Not Progressing Patient had stroke post surgery, is stable and pt.ot/st involved

## 2017-02-25 NOTE — Progress Notes (Signed)
Patient ID: Ashley Beard, female   DOB: February 14, 1966, 51 y.o.   MRN: [id number] Spoke with patient's daughter [name] to give her an update.  Discussed the carotid duplex with Dr. Trula Slade who has reviewed. Suggest possible elevated velocities in the more distal internal carotid artery. Postop changes in the carotid bifurcation.  Discussed the case with Dr.Deveshwar with interventional neuroradiology who will proceed with urgent diagnostic carotid arteriogram. We'll then discuss further treatment options.

## 2017-02-25 NOTE — Telephone Encounter (Signed)
-----   Message from Denman George, RN sent at 02/25/2017 11:14 AM EDT ----- Regarding: needs 2 week f/u with Dr. Donnetta Hutching   ----- Message ----- From: Gabriel Earing, PA-C Sent: 02/24/2017   1:02 PM To: Vvs Charge Pool  s/p left CEA 02/24/17.  f/u with MD (Early) in 2 weeks.  Thanks, Aldona Bar

## 2017-02-25 NOTE — Procedures (Signed)
S/P 4 vessel cerebral arteriogram. RT CFA approach. Findings. 1approx 50 % stenosis at distal aspect of Lt  endarterectomy suture  site  associated 2 to 3 small filling defects prox probably representing clots. 2.NO filling defects  Or  occlusions noted intracranially.

## 2017-02-25 NOTE — Progress Notes (Signed)
ANTICOAGULATION CONSULT NOTE - Follow Up Consult  Pharmacy Consult for Heparin Indication: non-hemorrhagic CVA  Allergies  Allergen Reactions  . Penicillins Hives and Other (See Comments)    Nightmares Has patient had a PCN reaction causing immediate rash, facial/tongue/throat swelling, SOB or lightheadedness with hypotension: Yes Has patient had a PCN reaction causing severe rash involving mucus membranes or skin necrosis: No Has patient had a PCN reaction that required hospitalization pt was in the hospital at time of reaction Has patient had a PCN reaction occurring within the last 10 years: No If all of the above answers are "NO", then may proceed with Cephalosporin use.  . Fish Allergy Itching and Swelling    Lymph node swelling  . Other Itching and Swelling    Reaction to blue cheese Lymph node swelling  . Shrimp [Shellfish Allergy] Itching and Swelling    Lymph node swelling  . Yeast-Related Products Itching and Swelling    Lymph node swelling    Patient Measurements: Height: 4\' 11"  (149.9 cm) Weight: 125 lb 12.8 oz (57.1 kg) IBW/kg (Calculated) : 43.2 Heparin Dosing Weight: 54.9 kg  Vital Signs: Temp: 99 F (37.2 C) (03/22 2010) Temp Source: Oral (03/22 2010) BP: 126/76 (03/22 2010) Pulse Rate: 95 (03/22 2010)  Labs:  Recent Labs  02/23/17 0342 02/23/17 1417 02/23/17 2253 02/24/17 0433 02/25/17 0416 02/25/17 1950  HGB 12.9  --   --  13.4 12.2  --   HCT 37.7  --   --  39.3 35.9*  --   PLT 128*  --   --  131* 96*  --   HEPARINUNFRC 1.18* 0.92* 0.80*  --   --  0.16*  CREATININE  --   --   --  0.94 0.72  --     Estimated Creatinine Clearance: 65.5 mL/min (by C-G formula based on SCr of 0.72 mg/dL).   Medications:  Infusions:  . sodium chloride 75 mL/hr at 02/25/17 1900  . heparin 500 Units/hr (02/25/17 1900)    Assessment: 51 yo female admitted with left carotid artery thrombus that is s/p removal of thrombus from carotid bifurcation and left  carotid endarterectomy on 3/21 pm that developed stroke like symptoms earlier this am. CT pertinent multifocal, nonhemorrhagic, acute LEFT hemisphere infarcts, likely embolic. Pharmacy consulted to restart heparin - started low at 8 units/kg/hr due to recent neuro intervention.   Heparin level on restart is low at 0.16 on 500 units/hr.   Goal of Therapy:  Heparin level 0.2 to 0.5 units/ml Monitor platelets by anticoagulation protocol: Yes   Plan:  Due to recent surgery and risk of hemorrhagic conversion, will maintain same drip rate and follow-up in AM.   Sloan Leiter, PharmD, BCPS Clinical Pharmacist 02/25/2017,9:20 PM

## 2017-02-25 NOTE — Progress Notes (Signed)
ANTICOAGULATION CONSULT NOTE - Initial Consult  Pharmacy Consult for heparin Indication: CVA  Allergies  Allergen Reactions  . Penicillins Hives and Other (See Comments)    Nightmares Has patient had a PCN reaction causing immediate rash, facial/tongue/throat swelling, SOB or lightheadedness with hypotension: Yes Has patient had a PCN reaction causing severe rash involving mucus membranes or skin necrosis: No Has patient had a PCN reaction that required hospitalization pt was in the hospital at time of reaction Has patient had a PCN reaction occurring within the last 10 years: No If all of the above answers are "NO", then may proceed with Cephalosporin use.  . Fish Allergy Itching and Swelling    Lymph node swelling  . Other Itching and Swelling    Reaction to blue cheese Lymph node swelling  . Shrimp [Shellfish Allergy] Itching and Swelling    Lymph node swelling  . Yeast-Related Products Itching and Swelling    Lymph node swelling    Patient Measurements: Height: 4\' 11"  (149.9 cm) Weight: 125 lb 12.8 oz (57.1 kg) IBW/kg (Calculated) : 43.2  Vital Signs: Temp: 97.4 F (36.3 C) (03/22 0736) Temp Source: Oral (03/22 0736) BP: 145/77 (03/22 0736) Pulse Rate: 71 (03/22 0736)  Labs:  Recent Labs  02/22/17 2001 02/22/17 2021 02/23/17 0342 02/23/17 1417 02/23/17 2253 02/24/17 0433 02/25/17 0416  HGB 13.8 12.9 12.9  --   --  13.4 12.2  HCT 39.8 38.0 37.7  --   --  39.3 35.9*  PLT 125*  --  128*  --   --  131* 96*  LABPROT 11.9  --   --   --   --   --   --   INR 0.88  --   --   --   --   --   --   HEPARINUNFRC  --   --  1.18* 0.92* 0.80*  --   --   CREATININE 1.04* 0.90  --   --   --  0.94 0.72    Estimated Creatinine Clearance: 65.5 mL/min (by C-G formula based on SCr of 0.72 mg/dL).   Medical History: Past Medical History:  Diagnosis Date  . Abnormal Pap smear of cervix    age 32  . Anxiety   . Carotid arterial disease ([place])   . Refusal of blood  transfusions as patient is Jehovah's Witness    Assessment: 51 yo female admitted with left carotid artery thrombus that is s/p removal of thrombus from carotid bifurcation and left carotid endarterectomy on 3/21 pm that developed stroke like symptoms earlier this am. CT pertinent multifocal, nonhemorrhagic, acute LEFT hemisphere infarcts, likely embolic. Pharmacy to start heparin.    Goal of Therapy:  Heparin level goal: 0.3 - 0.5 Monitor platelets by anticoagulation protocol: Yes   Plan:  1. Begin heparin infusion at 500 unit/hr 2. Heparin level in 8 hours and daily  3. Follow up long term anticoagulation plans   Vincenza Hews, PharmD, BCPS 02/25/2017, 9:10 AM

## 2017-02-25 NOTE — Care Management Note (Signed)
Case Management Note  Patient Details  Name: Ashley Beard MRN: [id number] Date of Birth: Jan 24, 1966  Subjective/Objective:  From home alone, pta indep, s/p CEA now with CVA, await pt/ot eval.  She has medication coverage , PCP and transportation at dc.  She has 2 daughters that live her in [place] and one sister that could assist her if needed.  NCM will cont to follow for dc needs.                   Action/Plan:   Expected Discharge Date:                  Expected Discharge Plan:  Home/Self Care  In-House Referral:     Discharge planning Services  CM Consult  Post Acute Care Choice:    Choice offered to:     DME Arranged:    DME Agency:     HH Arranged:    HH Agency:     Status of Service:  In process, will continue to follow  If discussed at Long Length of Stay Meetings, dates discussed:    Additional Comments:  Zenon Mayo, RN 02/25/2017, 4:54 PM

## 2017-02-25 NOTE — Progress Notes (Signed)
Patient ID: Ashley Beard, female   DOB: 08-31-1966, 51 y.o.   MRN: [id number] Cerebral arteriogram reviewed and discussed with Dr.Deveshwar.  Was a patent endarterectomy and patch segment. Mild irregularity and internal carotid distal to the patch. This is not flow limiting.  Ask Dr. Scot Dock to review for another opinion regarding this. I we do not feel that there is any indication for treatment other than anticoagulation for this. This is mildly irregular and is not flow limiting. Would much more likely cause more damage to explore this area.  Discussed at length with the patient, 2 daughters and other family members currently. She is at her same baseline as this morning from a neurologic standpoint. Does answer questions and moves all 4. Is status slow in responding.  Recommend continued stroke evaluation. Suspect that most likely she embolized this friable thrombus that was in her bifurcation at the time of surgery. Family reports that she was responding similarly last evening.

## 2017-02-25 NOTE — Progress Notes (Signed)
Received call from MD to make patient NPO after scanning meds but prior to giving meds to patient. Did not give daily meds to patient and started Heparin gtt per md order. Will continue to monitor.

## 2017-02-25 NOTE — Progress Notes (Signed)
During assessment patient seems to have difficulty seeing but patient denies any trouble. When asking patient to read a sentence, patient only reads right side of sentence. Notified MD and MD at bedside. Will continue to monitor.

## 2017-02-25 NOTE — Consult Note (Addendum)
Neurology Consult Note  Reason for Consultation: Neuro changes after L CEA   Requesting provider: Curt Jews, MD  CC: Numbness in the right arm  HPI: This is a 51 year old woman who underwent a left carotid endarterectomy on 02/24/17. Surgery was uneventful. However, this morning, she has been noted to have some neurological changes. The patient is somewhat aphasic which limits her ability to provide any history. This is largely obtained from review of the medical record and previous discussed with Dr. Donnetta Hutching.  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 Intraoperatively she was found to have a platelet based clot in the carotid artery with a mild degree of atherosclerosis. Immediately postoperatively she was doing well. However, this morning, she was noted to have some difficulty answering questions. On further assessment, she was noted to read only the left side of words. Nursing noted that when she tried to walk she appeared to have difficulty seeing objects in order to get around them. Neurology consultation is now requested for further recommendations. I advised CT scan of the head without contrast which has since been completed. 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  PMH:  Past Medical History:  Diagnosis Date  . Abnormal Pap smear of cervix    age 89  . Anxiety   . Carotid arterial disease ([place])   . Refusal of blood transfusions as patient is Jehovah's Witness     PSH:  Past Surgical History:  Procedure Laterality Date  . ENDARTERECTOMY Left 02/24/2017   Procedure: ENDARTERECTOMY CAROTID with removal of thrombus ;  Surgeon: Rosetta Posner, MD;  Location: [facility];  Service: Vascular;  Laterality: Left;  . GYNECOLOGIC CRYOSURGERY     age 49  . OOPHORECTOMY      Family history: Family History  Problem  Relation Age of Onset  . Hypertension Other   . Diabetes Other   . Diabetes Maternal Grandmother   . Hypertension Maternal Grandmother     Social history:  Social History   Social History  . Marital status: Single    Spouse name: N/A  . Number of children: N/A  . Years of education: N/A   Occupational History  . Not on file.   Social History Main Topics  . Smoking status: Former Smoker    Types: Cigarettes    Quit date: 12/2016  . Smokeless tobacco: Never Used     Comment: very rare, social  . Alcohol use 1.2 oz/week    2 Cans of beer per week     Comment: social  . Drug use: No  . Sexual activity: Yes    Birth control/ protection: Condom   Other Topics Concern  . Not on file   Social History Narrative  . No narrative on file    Current outpatient meds: Medications reviewed and reconciled.  Current Meds  Medication Sig  . aspirin EC 325 MG tablet Take 325 mg by mouth daily.  . cefdinir (OMNICEF) 300 MG capsule Take 300 mg by mouth 2 (two) times daily. 10 day course filled 02/17/17  . OVER THE COUNTER MEDICATION Take 1 tablet by mouth at bedtime as needed (allergies/sleep). OTC antihistamine    Current inpatient meds: Medications reviewed and reconciled.  Current Facility-Administered Medications  Medication Dose Route Frequency Provider Last Rate Last Dose  . 0.9 %  sodium chloride infusion  500 mL Intravenous Once PRN Samantha J Rhyne, PA-C      . 0.9 %  sodium chloride infusion   Intravenous Continuous Gabriel Earing, PA-C 75 mL/hr at 02/25/17 0400    . acetaminophen (TYLENOL) tablet 325-650 mg  325-650 mg Oral Q4H PRN Hulen Shouts Rhyne, PA-C       Or  . acetaminophen (TYLENOL) suppository 325-650 mg  325-650 mg Rectal Q4H PRN Hulen Shouts Rhyne, PA-C      . aspirin EC tablet 325 mg  325 mg Oral Daily Samantha J Rhyne, PA-C      . bisacodyl (DULCOLAX) suppository 10 mg  10 mg Rectal Daily PRN Samantha J Rhyne, PA-C      . docusate sodium (COLACE) capsule 100  mg  100 mg Oral Daily Samantha J Rhyne, PA-C      . guaiFENesin-dextromethorphan (ROBITUSSIN DM) 100-10 MG/5ML syrup 15 mL  15 mL Oral Q4H PRN Samantha J Rhyne, PA-C      . heparin ADULT infusion 100 units/mL (25000 units/236mL sodium chloride 0.45%)  500 Units/hr Intravenous Continuous Rosetta Posner, MD 5 mL/hr at 02/25/17 0920 500 Units/hr at 02/25/17 0920  . hydrALAZINE (APRESOLINE) injection 5 mg  5 mg Intravenous Q20 Min PRN Samantha J Rhyne, PA-C      . labetalol (NORMODYNE,TRANDATE) injection 10 mg  10 mg Intravenous Q10 min PRN Samantha J Rhyne, PA-C      . magnesium sulfate IVPB 2 g 50 mL  2 g Intravenous Daily PRN Samantha J Rhyne, PA-C      . metoprolol (LOPRESSOR) injection 2-5 mg  2-5 mg Intravenous Q2H PRN Samantha J Rhyne, PA-C      . morphine 2 MG/ML injection 2 mg  2 mg Intravenous Q3H PRN Hulen Shouts Rhyne, PA-C   2 mg at 02/25/17 0405  . ondansetron (ZOFRAN) injection 4 mg  4 mg Intravenous Q6H PRN Samantha J Rhyne, PA-C      . oxyCODONE-acetaminophen (PERCOCET/ROXICET) 5-325 MG per tablet 1-2 tablet  1-2 tablet Oral Q4H PRN Samantha J Rhyne, PA-C      . pantoprazole (PROTONIX) EC tablet 40 mg  40 mg Oral Daily Samantha J Rhyne, PA-C      . phenol (CHLORASEPTIC) mouth spray 1 spray  1 spray Mouth/Throat PRN Samantha J Rhyne, PA-C      . polyethylene glycol (MIRALAX / GLYCOLAX) packet 17 g  17 g Oral Daily PRN Samantha J Rhyne, PA-C      . potassium chloride SA (K-DUR,KLOR-CON) CR tablet 20-40 mEq  20-40 mEq Oral Daily PRN Hulen Shouts Rhyne, PA-C      . vancomycin (VANCOCIN) IVPB 1000 mg/200 mL premix  1,000 mg Intravenous Q12H Hulen Shouts Rhyne, PA-C   1,000 mg at 02/24/17 2208    Allergies: Allergies  Allergen Reactions  . Penicillins Hives and Other (See Comments)    Nightmares Has patient had a PCN reaction causing immediate rash, facial/tongue/throat swelling, SOB or lightheadedness with hypotension: Yes Has patient had a PCN reaction causing severe rash involving mucus  membranes or skin necrosis: No Has patient had a PCN reaction that required hospitalization pt was in the hospital at time of reaction Has patient had a PCN reaction occurring within the last 10 years: No If all of the above answers are "NO", then may proceed with Cephalosporin use.  . Fish Allergy Itching and Swelling    Lymph node swelling  . Other Itching and Swelling    Reaction to blue cheese Lymph node swelling  .  Shrimp [Shellfish Allergy] Itching and Swelling    Lymph node swelling  . Yeast-Related Products Itching and Swelling    Lymph node swelling    ROS: As per HPI. A full 14-point review of systems was performed and is otherwise unremarkable. This is, however, limited by aphasia.  PE:  BP (!) 145/77 (BP Location: Right Arm)   Pulse 71   Temp 97.4 F (36.3 C) (Oral)   Resp 16   Ht 4\' 11"  (1.499 m)   Wt 57.1 kg (125 lb 12.8 oz)   SpO2 100%   BMI 25.41 kg/m   General: WDWN, no acute distress. Speech clear, no dysarthria. She has a moderate global aphasia. She is able to follow commands but sometimes requires repetitive requests as well as visual demonstration. Affect is restricted.  HEENT: Normocephalic. Surgical incision at the left neck is clean and dry with no hematoma or hemorrhage. MMM, OP clear. Dentition good. Sclerae anicteric. No conjunctival injection.  CV: Regular, no murmur. Carotid pulses full and symmetric, no bruits. Distal pulses 2+ and symmetric.  Lungs: CTAB.  Extremities: No C/C/E. Neuro:  CN: Pupils are equal and round. They are symmetrically reactive from 3-->2 mm. She has a right homonymous hemianopia. EOMI without nystagmus though she tends to lose my finger as I move into her right visual fields. No reported diplopia. Facial sensation is intact to light touch. Face is symmetric at rest with normal strength and mobility. Hearing is intact to conversational voice. Palate elevates symmetrically and uvula is midline. Voice is normal in tone, pitch and  quality. Bilateral SCM and trapezii are 5/5. Tongue is midline with normal bulk and mobility.  Motor: Normal bulk, tone, and strength. No tremor or other abnormal movements. She has upward drift of the right arm. Sensation: She reports decreased sensation in the right arm. There appears to be extinction to double simultaneous stimuli on the right.  DTRs: 3+, symmetric. Toes downgoing on the left, upgoing on the right. No pathologic reflexes.  Coordination: Finger-to-nose and heel-to-shin are slower and more deliberate on the right side but are without dysmetria. Finger taps are slower on the right.   Labs:  Lab Results  Component Value Date   WBC 12.0 (H) 02/25/2017   HGB 12.2 02/25/2017   HCT 35.9 (L) 02/25/2017   PLT 96 (L) 02/25/2017   GLUCOSE 110 (H) 02/25/2017   ALT 14 02/22/2017   AST 19 02/22/2017   NA 136 02/25/2017   K 4.0 02/25/2017   CL 102 02/25/2017   CREATININE 0.72 02/25/2017   BUN 6 02/25/2017   CO2 23 02/25/2017   TSH 1.91 02/15/2017   INR 0.88 02/22/2017    Imaging:  I have personally and independently reviewed CT scan of the head without contrast from today. This shows patchy areas of hypo-density throughout the left cerebral hemisphere involving the frontal lobe and parietal lobe including the parieto-occipital junction. These are consistent with subacute ischemic infarctions. There is no evidence of hemorrhage. The remainder of the brain appears to be unremarkable.  I have personally and independently reviewed the MRI of the brain without contrast from 02/23/17. This shows a small focal area of T2/FLAIR hyperintensity along the lateral aspect of the L temporal lobe. There is no associated change on DWI. This lesion appears to be cortically located without associated mass effect. This is non-specific.   TTE 02/23/17: LV normal in size and function; EF 60-65%. Mild calcification of the annulus of the aortic valve.   Assessment and  Plan:  1. Acute Ischemic Stroke:  This is an acute stroke involving the L MCA and L PCA territores. It is most likely embolic in etiology, in this case due to LICA pathology and intervention. Involvement of the L PCA territory would suggest  persistent fetal origin of that vessel. She does not have any of the traditional stroke risk factors. Carotid Dopplers this morning showed elevated velocities in the LICA concerning for possible stenosis of the distal portion of the vessel. She is to undergo urgent carotid arteriogram for further evaluation.TTE was unremarkable. I will order MRI brain without contrast,  fasting lipids, and hemoglobin a1c. Continue aspirin for secondary stroke prevention. Caution should be used with anticoagulation given embolic nature of strokes as these have a greater propensity for hemorrhagic conversion. Allow permissive hypertension in the acute phase, treating only SBP greater than 220 mmHg and/or DBP greater than 110 mmHg as needed. Avoid fever and hyperglycemia as these can extend the infarct. Avoid hypotonic IVF to minimize exacerbation of post-stroke edema. Initiate rehab services. DVT prophylaxis as needed.   2. 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 SLP/rehab.  3. 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 PT/OT/rehab.  4. RUE numbness: This is acute, due to stroke. PT/OT/rehab.   5. Right hemineglect: This is acute, due to stroke. This is mild and consists mainly of some inattention to the right side. PT/OT/rehab.    6. Abnormal MRI brain: Prior scan on 02/23/17 showed a focal area of increased signal in the L temporal lobe that is of uncertain significance. I will repeat the MRI with and without contrast to further evaluate both her stroke and this lesion.   Fall risk: She is at increased risk of falls due to her heminegelct, hemianopsia, and sensory changes. Fall precautions. Limit psychoactive medications and sedating medications.  Thank you for this consult. The stroke team  will assume care of the patient starting 02/25/17. Please call with any urgent questions or concerns.

## 2017-02-25 NOTE — Telephone Encounter (Signed)
Sched appt 03/10/17 at 1:45. Lm on hm# to inform pt of appt.

## 2017-02-25 NOTE — Sedation Documentation (Signed)
Bedside report given to [facility], [place]. Pulses and groin checked and WDL.

## 2017-02-26 ENCOUNTER — Encounter: Payer: Self-pay | Admitting: Vascular Surgery

## 2017-02-26 ENCOUNTER — Encounter (HOSPITAL_COMMUNITY): Payer: Self-pay | Admitting: Interventional Radiology

## 2017-02-26 DIAGNOSIS — I63312 Cerebral infarction due to thrombosis of left middle cerebral artery: Secondary | ICD-10-CM

## 2017-02-26 DIAGNOSIS — R4701 Aphasia: Secondary | ICD-10-CM

## 2017-02-26 DIAGNOSIS — Z8673 Personal history of transient ischemic attack (TIA), and cerebral infarction without residual deficits: Secondary | ICD-10-CM | POA: Insufficient documentation

## 2017-02-26 DIAGNOSIS — I779 Disorder of arteries and arterioles, unspecified: Secondary | ICD-10-CM

## 2017-02-26 LAB — LIPID PANEL
Cholesterol: 125 mg/dL (ref 0–200)
HDL: 48 mg/dL
LDL Cholesterol: 62 mg/dL (ref 0–99)
Total CHOL/HDL Ratio: 2.6 ratio
Triglycerides: 74 mg/dL
VLDL: 15 mg/dL (ref 0–40)

## 2017-02-26 LAB — HEPARIN LEVEL (UNFRACTIONATED)
HEPARIN UNFRACTIONATED: 0.22 [IU]/mL — AB (ref 0.30–0.70)
Heparin Unfractionated: 0.14 [IU]/mL — ABNORMAL LOW (ref 0.30–0.70)

## 2017-02-26 MED ORDER — CLOPIDOGREL BISULFATE 75 MG PO TABS
75.0000 mg | ORAL_TABLET | Freq: Every day | ORAL | Status: DC
  Administered 2017-02-26 – 2017-03-02 (×5): 75 mg via ORAL
  Filled 2017-02-26 (×5): qty 1

## 2017-02-26 NOTE — Progress Notes (Signed)
ANTICOAGULATION CONSULT NOTE - Follow Up Consult  Pharmacy Consult for heparin Indication: stroke  Labs:  Recent Labs  02/23/17 0342  02/23/17 2253 02/24/17 0433 02/25/17 0416 02/25/17 1950 02/26/17 0203  HGB 12.9  --   --  13.4 12.2  --   --   HCT 37.7  --   --  39.3 35.9*  --   --   PLT 128*  --   --  131* 96*  --   --   HEPARINUNFRC 1.18*  < > 0.80*  --   --  0.16* 0.22*  CREATININE  --   --   --  0.94 0.72  --   --   < > = values in this interval not displayed.   Assessment/Plan:  51yo female therapeutic on heparin given low goal. Will continue gtt at current rate and f/u w/ neuro regarding appropriate goals.   Wynona Neat, PharmD, BCPS  02/26/2017,3:30 AM

## 2017-02-26 NOTE — Progress Notes (Signed)
Patient complaining of numbness along incision site of left neck.  Pt. Is able to feel touch when assessed.  Patient states "it just feels number."  Pt. VSS with no s/s of distress noted.  Updated Dr. Donnetta Hutching on patients change in condition, received no new orders at this time.  Will continue to monitor.

## 2017-02-26 NOTE — Plan of Care (Signed)
Problem: Health Behavior/Discharge Planning: Goal: Ability to manage health-related needs will improve Outcome: Progressing Patient able to eat and brush her teeth with minimal assist using affected hand.

## 2017-02-26 NOTE — Consult Note (Signed)
Physical Medicine and Rehabilitation Consult Reason for Consult: Left MCA and PCA territory infarcts Referring Physician: Dr. Donnetta Hutching   HPI: Ashley Beard is a 51 y.o. right handed female admitted 02/22/2017 for evaluation of thrombus left carotid artery after a recent outpatient CT scan showed a large clot located in the left carotid bifurcation with extension into her left external carotid artery. Vascular surgery consulted underwent removal of thrombus left carotid endarterectomy 02/24/2017 per Dr. Donnetta Hutching. Neurology services consulted 02/25/2017 for numbness in the right arm as well as having difficulty answering simple questions. MRI imaging showed left MCA territory infarct involving the left frontal parietal lobes. Additional occipital lobe involvement likely due to fetal-type PCA. Recent echocardiogram ejection fraction of 65% no wall motion abnormalities. Patient did not receive TPA. Currently maintained on aspirin/Plavix as well as intravenous heparin. Tolerating a regular diet. Occupational therapy evaluation completed. Per chart review patient lives alone she does have a boyfriend who can provide assistance. One level home. 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 Request made for physical medicine rehabilitation consult.   Review of Systems  Constitutional: Negative for chills and fever.  HENT: Negative for hearing loss and tinnitus.   Eyes: Negative for blurred vision and double vision.  Respiratory: Negative for cough and shortness of breath.   Cardiovascular: Negative for chest pain, palpitations and leg swelling.  Gastrointestinal: Positive for constipation. Negative for nausea and vomiting.  Genitourinary: Negative for dysuria and hematuria.  Musculoskeletal: Positive for myalgias.  Skin: Negative for rash.  Neurological: Positive for dizziness, sensory change, speech change and headaches. Negative for seizures.  Psychiatric/Behavioral:       Anxiety  All  other systems reviewed and are negative.  Past Medical History:  Diagnosis Date  . Abnormal Pap smear of cervix    age 14  . Anxiety   . Carotid arterial disease ([place])   . Refusal of blood transfusions as patient is Jehovah's Witness    Past Surgical History:  Procedure Laterality Date  . ENDARTERECTOMY Left 02/24/2017   Procedure: ENDARTERECTOMY CAROTID with removal of thrombus ;  Surgeon: Rosetta Posner, MD;  Location: [place];  Service: Vascular;  Laterality: Left;  . GYNECOLOGIC CRYOSURGERY     age 23  . OOPHORECTOMY     Family History  Problem Relation Age of Onset  . Hypertension Other   . Diabetes Other   . Diabetes Maternal Grandmother   . Hypertension Maternal Grandmother    Social History:  reports that she quit smoking about 2 months ago. Her smoking use included Cigarettes. She has never used smokeless tobacco. She reports that she drinks about 1.2 oz of alcohol per week . She reports that she does not use drugs. Allergies:  Allergies  Allergen Reactions  . Penicillins Hives and Other (See Comments)    Nightmares Has patient had a PCN reaction causing immediate rash, facial/tongue/throat swelling, SOB or lightheadedness with hypotension: Yes Has patient had a PCN reaction causing severe rash involving mucus membranes or skin necrosis: No Has patient had a PCN reaction that required hospitalization pt was in the hospital at time of reaction Has patient had a PCN reaction occurring within the last 10 years: No If all of the above answers are "NO", then may proceed with Cephalosporin use.  . Fish Allergy Itching and Swelling    Lymph node swelling  . Other Itching and Swelling    Reaction to blue cheese Lymph node swelling  . Shrimp [employer]  Allergy] Itching and Swelling    Lymph node swelling  . Yeast-Related Products Itching and Swelling    Lymph node swelling   Medications Prior to Admission  Medication Sig Dispense Refill  . aspirin EC 325 MG tablet Take 325  mg by mouth daily.    . cefdinir (OMNICEF) 300 MG capsule Take 300 mg by mouth 2 (two) times daily. 10 day course filled 02/17/17  0  . OVER THE COUNTER MEDICATION Take 1 tablet by mouth at bedtime as needed (allergies/sleep). OTC antihistamine    . albuterol (PROVENTIL HFA;VENTOLIN HFA) 108 (90 Base) MCG/ACT inhaler Inhale 2 puffs into the lungs every 4 (four) hours as needed for wheezing or shortness of breath. (Patient not taking: Reported on 02/22/2017) 1 Inhaler 0    Home: Home Living Family/patient expects to be discharged to:: Private residence Living Arrangements: Alone Available Help at Discharge: Friend(s), Available 24 hours/day, Family (boyfriend can stay with her) Type of Home: Apartment Home Access: Level entry Home Layout: One level Bathroom Shower/Tub: Multimedia programmer: Handicapped height Home Equipment: Grab bars - toilet, Grab bars - tub/shower Additional Comments: Pt's apartment in handicap accessible.  Functional History: Prior Function Level of Independence: Independent Comments: works in Psychiatric nurse Status:  Mobility: Bed Mobility General bed mobility comments: pt in chair Transfers Overall transfer level: Needs assistance Transfers: Sit to/from Stand Sit to Stand: Min guard General transfer comment: stood impulsively, min guard for safety      ADL: ADL Overall ADL's : Needs assistance/impaired Eating/Feeding: Set up, Sitting Grooming: Oral care, Sitting, Minimal assistance Grooming Details (indicate cue type and reason): used R hand to lead Upper Body Bathing: Minimal assistance, Sitting Lower Body Bathing: Minimal assistance, Sit to/from stand Upper Body Dressing : Moderate assistance, Sitting Lower Body Dressing: Minimal assistance, Sit to/from stand Lower Body Dressing Details (indicate cue type and reason): able to don R sock with increased time Toilet Transfer: Minimal assistance, Ambulation, Comfort height toilet Toileting-  Clothing Manipulation and Hygiene: Minimal assistance, Sit to/from stand Functional mobility during ADLs: Minimal assistance, Cueing for safety  Cognition: Cognition Overall Cognitive Status: Difficult to assess Orientation Level: Oriented X4 Cognition Arousal/Alertness: Awake/alert Behavior During Therapy: WFL for tasks assessed/performed Overall Cognitive Status: Difficult to assess General Comments: appears intact, follows commands, able to offer PLOF/home set up Difficult to assess due to: Impaired communication  Blood pressure 138/90, pulse 80, temperature 99.1 F (37.3 C), temperature source Oral, resp. rate 17, height 4\' 11"  (1.499 m), weight 57.1 kg (125 lb 12.8 oz), SpO2 99 %. Physical Exam  Vitals reviewed. Constitutional: She is oriented to person, place, and time. She appears well-developed.  HENT:  Head: Normocephalic.  Eyes: EOM are normal. Left eye exhibits no discharge.  Neck: Normal range of motion. Neck supple. No thyromegaly present.  Cardiovascular: Normal rate, regular rhythm and normal heart sounds.   Respiratory: Effort normal and breath sounds normal. No respiratory distress.  GI: Soft. Bowel sounds are normal. She exhibits no distension.  Neurological: She is alert and oriented to person, place, and time.  Pt alert and oriented x 3 with extra time. Has some word finding deficits. Identified several objects with only mild delay. Visual fields appear grossly intact. No CN deficits. Past points with RUE during FTN testing. Strength nearly 4+ to 5/5 RUE and RLE and 5/5 LUE and LLE. Mild sensory loss right arm?   Skin: Skin is warm and dry.    Results for orders placed or performed during the hospital  encounter of 02/22/17 (from the past 24 hour(s))  Heparin level (unfractionated)     Status: Abnormal   Collection Time: 02/25/17  7:50 PM  Result Value Ref Range   Heparin Unfractionated 0.16 (L) 0.30 - 0.70 IU/mL  Heparin level (unfractionated)     Status:  Abnormal   Collection Time: 02/26/17  2:03 AM  Result Value Ref Range   Heparin Unfractionated 0.22 (L) 0.30 - 0.70 IU/mL  Lipid panel     Status: None   Collection Time: 02/26/17  2:03 AM  Result Value Ref Range   Cholesterol 125 0 - 200 mg/dL   Triglycerides 74 <150 mg/dL   HDL 48 >40 mg/dL   Total CHOL/HDL Ratio 2.6 RATIO   VLDL 15 0 - 40 mg/dL   LDL Cholesterol 62 0 - 99 mg/dL  Heparin level (unfractionated)     Status: Abnormal   Collection Time: 02/26/17 12:01 PM  Result Value Ref Range   Heparin Unfractionated 0.14 (L) 0.30 - 0.70 IU/mL   Ct Head Wo Contrast  Result Date: 02/25/2017 CLINICAL DATA:  Altered mental status, and visual difficulty, recent LEFT carotid endarterectomy. EXAM: CT HEAD WITHOUT CONTRAST TECHNIQUE: Contiguous axial images were obtained from the base of the skull through the vertex without intravenous contrast. COMPARISON:  MR brain 02/23/2017. FINDINGS: Brain: Widespread areas of cytotoxic edema are seen throughout the cortex of the LEFT frontal lobe, posterior frontal lobe, parietal lobe, and occipital lobe, with some involvement of the subcortical white matter. These are nonhemorrhagic. Multiple emboli with bland infarctions are suspected. No hemorrhage, mass lesion, midline shift, hydrocephalus, or extra-axial fluid. Vascular: No hyperdense vessel or unexpected calcification. Skull: Normal. Negative for fracture or focal lesion. Sinuses/Orbits: No acute finding. Other: None. Compared with prior MR brain 02/23/2017, the findings represent an interval change. IMPRESSION: Findings consistent with multifocal, nonhemorrhagic, acute LEFT hemisphere infarcts, likely embolic. These lie predominantly with the LEFT MCA territory, with additional involvement of LEFT occipital lobe suggesting fetal type PCA anatomy. MRI brain without contrast could provide additional information. These results were called by telephone at the time of interpretation on 02/25/2017 at 8:53 am to  [facility] , who verbally acknowledged these results. Electronically Signed   By: Staci Righter M.D.   On: 02/25/2017 08:55   Mr Jeri Cos YW Contrast  Result Date: 02/25/2017 CLINICAL DATA:  Initial evaluation for acute stroke. EXAM: MRI HEAD WITHOUT AND WITH CONTRAST TECHNIQUE: Multiplanar, multiecho pulse sequences of the brain and surrounding structures were obtained without and with intravenous contrast. CONTRAST:  5mL MULTIHANCE GADOBENATE DIMEGLUMINE 529 MG/ML IV SOLN COMPARISON:  Prior CT from earlier the same day as well as previous MRI from 02/23/2017. FINDINGS: Brain: Multifocal areas of ischemic infarcts are seen involving predominantly the left MCA territory. Areas of infarction are predominantly cortical in nature, and involve the left frontal and parietal lobes. These are suspected to be embolic in nature. Additional cortical infarct within the left occipital lobe, likely due to fetal type PCA. Associated petechial hemorrhage within the areas of infarction without hemorrhagic transformation. No significant mass effect. Scattered leptomeningeal enhancement also noted within the area of infarct. Previously noted approximate 1 cm FLAIR signal abnormality at the peripheral left temporal lobe is better seen on today's study, and appears to be extra-axial in nature. This demonstrates solid postcontrast enhancement, and is most consistent with a small meningioma. Lesion measures 10 x 8 x 10 mm. No associated edema. The otherwise stable appearance of the brain. Cerebral volume normal. No  significant cerebral white matter disease. No other mass lesion or abnormal enhancement. No extra-axial fluid collection. Ventricles normal size without hydrocephalus. Major dural sinuses are patent. Pituitary gland and suprasellar region within normal limits. Vascular: Major intracranial vascular flow voids are maintained. Skull and upper cervical spine: Craniocervical junction within normal limits. Visualized upper  cervical spine unremarkable. Bone marrow signal intensity normal. No scalp soft tissue abnormality. Sinuses/Orbits: Globes and orbital soft tissues within normal limits. Paranasal sinuses are clear. No mastoid effusion. Inner ear structures normal. Other: No other significant finding. IMPRESSION: 1. Multifocal ischemic predominantly cortical left MCA territory infarcts involving the left frontal and parietal lobes. Again, these are suspected to be embolic in nature. Additional occipital lobe involvement likely due to fetal type PCA as previously mentioned. Associated petechial hemorrhage without frank hemorrhagic transformation. 2. 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 No associated edema. Electronically Signed   By: Jeannine Boga M.D.   On: 02/25/2017 23:32    Assessment/Plan: Diagnosis: Left MCA and PCA territory infarcts 1. Does the need for close, 24 hr/day medical supervision in concert with the patient's rehab needs make it unreasonable for this patient to be served in a less intensive setting? Potentially 2. Co-Morbidities requiring supervision/potential complications: HTN, s/p left CEA for CS 3. Due to bladder management, bowel management, safety, skin/wound care, disease management, medication administration, pain management and patient education, does the patient require 24 hr/day rehab nursing? Potentially 4. Does the patient require coordinated care of a physician, rehab nurse, PT (1-2 hrs/day, 5 days/week), OT (1-2 hrs/day, 5 days/week) and SLP (1-2 hrs/day, 5 days/week) to address physical and functional deficits in the context of the above medical diagnosis(es)? No and Potentially Addressing deficits in the following areas: balance, endurance, locomotion, strength, transferring, bowel/bladder control, bathing, dressing, feeding, grooming, toileting  and psychosocial support 5. Can the patient actively participate in an intensive therapy program of at least 3 hrs of therapy per day at least 5 days per week? Yes 6. The potential for patient to make measurable gains while on inpatient rehab is fair 7. Anticipated functional outcomes upon discharge from inpatient rehab are see below  with PT, see below with OT, see below with SLP. 8. Estimated rehab length of stay to reach the above functional goals is: see below 9. Does the patient have adequate social supports and living environment to accommodate these discharge functional goals? Yes 10. Anticipated D/C setting: Home 11. Anticipated post D/C treatments: HH therapy and Outpatient therapy 12. Overall Rehab/Functional Prognosis: excellent  RECOMMENDATIONS: This patient's condition is appropriate for continued rehabilitative care in the following setting: see below Patient has agreed to participate in recommended program. Yes Note that insurance prior authorization may be required for reimbursement for recommended care.  Comment: Pt is only one day out from stroke. She is already demonstrating neurological gains. I suspect she will continue to progress quickly and can go home with supervision initially.   Rehab Admissions Coordinator to follow up.  Thanks,  Meredith Staggers, MD, Mellody Drown    Cathlyn Parsons., PA-C 02/26/2017

## 2017-02-26 NOTE — Evaluation (Signed)
Speech Language Pathology Evaluation Patient Details Name: Ashley Beard MRN: [id number] DOB: Jun 13, 1966 Today's Date: 02/26/2017 Time: 1126-1205 SLP Time Calculation (min) (ACUTE ONLY): 39 min  Problem List:  Patient Active Problem List   Diagnosis Date Noted  . Odynophagia 02/22/2017  . Lymphadenopathy of head and neck 02/22/2017  . Elevated blood pressure reading 02/22/2017  . Carotid artery disease ([place]) 02/22/2017   Past Medical History:  Past Medical History:  Diagnosis Date  . Abnormal Pap smear of cervix    age 60  . Anxiety   . Carotid arterial disease ([place])   . Refusal of blood transfusions as patient is Jehovah's Witness    Past Surgical History:  Past Surgical History:  Procedure Laterality Date  . ENDARTERECTOMY Left 02/24/2017   Procedure: ENDARTERECTOMY CAROTID with removal of thrombus ;  Surgeon: Rosetta Posner, MD;  Location: [place];  Service: Vascular;  Laterality: Left;  . GYNECOLOGIC CRYOSURGERY     age 88  . OOPHORECTOMY     HPI:  This is a 51 year old woman who underwent a left carotid endarterectomy on 02/24/17. Surgery was uneventful. However, the next morning, she was noted to have some neurological changes. 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 Pt was able to follow complex commands with slightly extra time needed if dealing with visual information due to right field deficits. Pt was able to name 100% of objects, pictures, body parts in confronational tasks and was 100% accurate with responsive and divergent naming tasks. When describing pictures pt had increased difficulty with word finding with nouns and verbs, but used circomlocution successfully and responded well to phonemic cues. Primary problem is with  grammatical structures for complete sentences, communication is telegraphic. When given a model for a full sentence and then min verbal cues for pronouns pt was able to extend speech about her dog into complete sentences x5. Pt also able to describe a picture in complete sentences with moderate verbal cues for sentence structure. Pt will need ongoing therapeutic interventions for language, but also safety given visualspatial deficits. Strongly recommend CIR given good potential for rehabilition and improved safety.     SLP Assessment  SLP Recommendation/Assessment: Patient needs continued Speech Lanaguage Pathology Services SLP Visit Diagnosis: Aphasia (R47.01)    Follow Up Recommendations       Frequency and Duration min 2x/week  2 weeks      SLP Evaluation Cognition  Overall Cognitive Status: Within Functional Limits for tasks assessed Arousal/Alertness: Awake/alert Orientation Level: Oriented X4 Attention: Alternating Focused Attention: Appears intact Alternating Attention: Appears intact Memory: Appears intact Awareness: Appears intact Problem Solving: Appears intact Safety/Judgment: Appears intact       Comprehension  Auditory Comprehension Overall Auditory Comprehension: Appears within functional limits for tasks assessed Yes/No Questions: Within Functional Limits Commands: Within Functional Limits Conversation: Complex Interfering Components: Visual impairments EffectiveTechniques: Extra processing time Reading Comprehension Reading Status: Within funtional limits (difficulty with text in right visual field)    Expression Expression Primary Mode of Expression: Verbal Verbal Expression Overall Verbal Expression: Impaired Initiation: No impairment Automatic Speech: Name;Social Response Level of Generative/Spontaneous Verbalization: Word Repetition: Impaired Level of Impairment: Sentence level (only with very complex words/sentence) Naming: Impairment (only in  functional communication) Responsive: 76-100% accurate Confrontation: Impaired Convergent: 75-100% accurate (90% accurate) Divergent: 75-100% accurate Verbal  Errors: Semantic paraphasias (telegraphic ) Pragmatics: No impairment Effective Techniques: Open ended questions;Sentence completion Written Expression Dominant Hand: Right Written Expression: Exceptions to [facility] Self Formulation Ability: Word Interfering Components:  (right field cut)   Oral / Motor  Oral Motor/Sensory Function Overall Oral Motor/Sensory Function: Within functional limits Motor Speech Overall Motor Speech: Appears within functional limits for tasks assessed   GO                    Lashaunda Schild, Katherene Ponto 02/26/2017, 2:01 PM

## 2017-02-26 NOTE — Progress Notes (Signed)
Pt. Reported bleeding with urination upon examination two small blood clots were noted in urine.  Pt. Asymptomatic at this time.  Pt. VSS with no s/s of distress noted.  Updated Dr. Donnetta Hutching on patients change in condition, received no new orders at this time.  Educated patient on importance of reporting any further bleeding, pt. Verbalized understanding.  Will continue to monitor.

## 2017-02-26 NOTE — Progress Notes (Signed)
ANTICOAGULATION CONSULT NOTE - Follow Up Consult  Pharmacy Consult for Heparin Indication: non-hemorrhagic CVA  Patient Measurements: Height: 4\' 11"  (149.9 cm) Weight: 125 lb 12.8 oz (57.1 kg) IBW/kg (Calculated) : 43.2 Heparin Dosing Weight: 54.9 kg  Vital Signs: Temp: 99.1 F (37.3 C) (03/23 1116) Temp Source: Oral (03/23 1116) BP: 138/90 (03/23 1116) Pulse Rate: 80 (03/23 1116)  Labs:  Recent Labs  02/24/17 0433 02/25/17 0416 02/25/17 1950 02/26/17 0203 02/26/17 1201  HGB 13.4 12.2  --   --   --   HCT 39.3 35.9*  --   --   --   PLT 131* 96*  --   --   --   HEPARINUNFRC  --   --  0.16* 0.22* 0.14*  CREATININE 0.94 0.72  --   --   --     Estimated Creatinine Clearance: 65.5 mL/min (by C-G formula based on SCr of 0.72 mg/dL).   Medications:  Infusions:  . sodium chloride 75 mL/hr at 02/26/17 0000  . heparin 500 Units/hr (02/26/17 0000)    Assessment: 51 yo female admitted with left carotid artery thrombus that is s/p removal of thrombus from carotid bifurcation and left carotid endarterectomy on 3/21 pm that developed stroke like symptoms earlier this am. CT pertinent multifocal, nonhemorrhagic, acute LEFT hemisphere infarcts, likely embolic.   Heparin level of 0.14 is slightly lower than goal of 0.2 - 0.5.  Stoke team to discuss with vascular about a/c plans.    Goal of Therapy:  Heparin level 0.2 to 0.5 units/ml Monitor platelets by anticoagulation protocol: Yes    Plan:  1. Increase heparin infusion slightly to 550 units/hr  2. Continue lower target range due to recent surgery and risk of hemorrhagic conversion 3. Repeat heparin level in 6 hours 4. Daily heparin level and CBC  5. Follow up anticoagulation plans with stroke team   Vincenza Hews, PharmD, BCPS 02/26/2017, 2:04 PM

## 2017-02-26 NOTE — Evaluation (Signed)
Occupational Therapy Evaluation Patient Details Name: Ashley Beard MRN: [id number] DOB: 15-Apr-1966 Today's Date: 02/26/2017    History of Present Illness Pt admitted for L CEA now with new L MCA and PCA territory infarcts. Pt with hx of TIA 2-3 weeks ago.    Clinical Impression   Pt was independent prior to admission. Presents with impaired speech, R UE weakness and incoordination, R field cut and inattention. She is currently functioning at a min assist level in ADL and mobility. Began educating pt in safety issues and compensatory strategies for deficits. Daughter present and participating in session. Recommending brief inpatient rehab stay with goal of pt progressing to a modified independent level. Will follow.    Follow Up Recommendations  CIR;Supervision/Assistance - 24 hour    Equipment Recommendations       Recommendations for Other Services Rehab consult     Precautions / Restrictions Precautions Precautions: Fall Precaution Comments: R field cut and inattention Restrictions Weight Bearing Restrictions: No      Mobility Bed Mobility               General bed mobility comments: pt in chair  Transfers Overall transfer level: Needs assistance   Transfers: Sit to/from Stand Sit to Stand: Min guard         General transfer comment: stood impulsively, min guard for safety    Balance Overall balance assessment: Needs assistance   Sitting balance-Leahy Scale: Good Sitting balance - Comments: no LOB with LB dressing     Standing balance-Leahy Scale: Fair                             ADL either performed or assessed with clinical judgement   ADL Overall ADL's : Needs assistance/impaired Eating/Feeding: Set up;Sitting   Grooming: Oral care;Sitting;Minimal assistance Grooming Details (indicate cue type and reason): used R hand to lead Upper Body Bathing: Minimal assistance;Sitting   Lower Body Bathing: Minimal assistance;Sit to/from  stand   Upper Body Dressing : Moderate assistance;Sitting   Lower Body Dressing: Minimal assistance;Sit to/from stand Lower Body Dressing Details (indicate cue type and reason): able to don R sock with increased time Toilet Transfer: Minimal assistance;Ambulation;Comfort height toilet   Toileting- Clothing Manipulation and Hygiene: Minimal assistance;Sit to/from stand       Functional mobility during ADLs: Minimal assistance;Cueing for safety       Vision Baseline Vision/History: No visual deficits Patient Visual Report: Peripheral vision impairment Vision Assessment?: Yes Eye Alignment: Within Functional Limits Ocular Range of Motion: Restricted on the right Tracking/Visual Pursuits: Right eye does not track laterally;Left eye does not track medially (limited tracking) Visual Fields: Right visual field deficit Additional Comments: R inattention, impaired R/L directionality     Perception     Praxis      Pertinent Vitals/Pain Pain Assessment: No/denies pain     Hand Dominance Right   Extremity/Trunk Assessment Upper Extremity Assessment Upper Extremity Assessment: RUE deficits/detail RUE Deficits / Details: 3+/5, postures with ambulation RUE Coordination: decreased fine motor;decreased gross motor   Lower Extremity Assessment Lower Extremity Assessment: Defer to PT evaluation       Communication Communication Communication: Expressive difficulties   Cognition Arousal/Alertness: Awake/alert Behavior During Therapy: WFL for tasks assessed/performed Overall Cognitive Status: Difficult to assess                                 General  Comments: appears intact, follows commands, able to offer PLOF/home set up   General Comments       Exercises     Shoulder Instructions      Home Living Family/patient expects to be discharged to:: Private residence Living Arrangements: Alone Available Help at Discharge: Friend(s);Available 24 hours/day;Family  (boyfriend can stay with her) Type of Home: Apartment Home Access: Level entry     Home Layout: One level     Bathroom Shower/Tub: Occupational psychologist: Handicapped height     Home Equipment: Grab bars - toilet;Grab bars - tub/shower   Additional Comments: Pt's apartment in handicap accessible.      Prior Functioning/Environment Level of Independence: Independent        Comments: works in [employer] Problem List: Decreased strength;Decreased activity tolerance;Impaired balance (sitting and/or standing);Decreased safety awareness;Decreased coordination;Impaired vision/perception;Impaired UE functional use      OT Treatment/Interventions: Self-care/ADL training;Patient/family education;Balance training;Therapeutic activities;Visual/perceptual remediation/compensation;Neuromuscular education    OT Goals(Current goals can be found in the care plan section) Acute Rehab OT Goals Patient Stated Goal: to return to PLOF OT Goal Formulation: With patient Time For Goal Achievement: 03/12/17 Potential to Achieve Goals: Good ADL Goals Pt Will Perform Grooming: with modified independence;standing Pt Will Perform Upper Body Bathing: with modified independence;standing Pt Will Perform Lower Body Bathing: with modified independence;sit to/from stand Pt Will Transfer to Toilet: with modified independence;ambulating Pt Will Perform Toileting - Clothing Manipulation and hygiene: with modified independence;sit to/from stand Pt Will Perform Tub/Shower Transfer: Shower transfer;ambulating;with modified independence Pt/caregiver will Perform Home Exercise Program: Right Upper extremity;Increased strength;Independently;With written HEP provided (and coordination) Additional ADL Goal #1: Pt will utilize compensatory strategies to locate visual targets/ ADL items in R hemispace.  OT Frequency: Min 3X/week   Barriers to D/C:            Co-evaluation               End of Session Equipment Utilized During Treatment: Gait belt  Activity Tolerance: Patient tolerated treatment well Patient left: in chair;with call bell/phone within reach;with family/visitor present  OT Visit Diagnosis: Unsteadiness on feet (R26.81);Cognitive communication deficit (R41.841);Hemiplegia and hemiparesis (R field deficit, inattention) Symptoms and signs involving cognitive functions: Cerebral infarction Hemiplegia - Right/Left: Right Hemiplegia - dominant/non-dominant: Dominant Hemiplegia - caused by: Cerebral infarction                Time: 4034-7425 OT Time Calculation (min): 18 min Charges:  OT General Charges $OT Visit: 1 Procedure OT Evaluation $OT Eval Moderate Complexity: 1 Procedure G-Codes:      Malka So 02/26/2017, 10:55 AM  [phone]

## 2017-02-26 NOTE — Progress Notes (Signed)
Subjective: Interval History: none.. Patient sitting up in chair. Her daughter is in room with her. Has been walking in the halls without difficulty. No difficulty with swallowing and is doing well with her meals.  Objective: Vital signs in last 24 hours: Temp:  [98.1 F (36.7 C)-99.4 F (37.4 C)] 98.1 F (36.7 C) (03/23 0700) Pulse Rate:  [68-101] 68 (03/23 0714) Resp:  [11-19] 19 (03/23 0325) BP: (125-151)/(76-88) 125/83 (03/23 0714) SpO2:  [98 %-100 %] 100 % (03/23 0714)  Intake/Output from previous day: 03/22 0701 - 03/23 0700 In: 3194.3 [P.O.:840; I.V.:2354.3] Out: 1400 [Urine:1400] Intake/Output this shift: Total I/O In: 240 [P.O.:240] Out: -   Still very clumsy with her right arm. Left neck incision healing nicely. Answers questions appropriately  Lab Results:  Recent Labs  02/24/17 0433 02/25/17 0416  WBC 7.5 12.0*  HGB 13.4 12.2  HCT 39.3 35.9*  PLT 131* 96*   BMET  Recent Labs  02/24/17 0433 02/25/17 0416  NA 139 136  K 3.9 4.0  CL 101 102  CO2 30 23  GLUCOSE 91 110*  BUN 7 6  CREATININE 0.94 0.72  CALCIUM 8.9 8.4*    Studies/Results: Ct Head Wo Contrast  Result Date: 02/25/2017 CLINICAL DATA:  Altered mental status, and visual difficulty, recent LEFT carotid endarterectomy. EXAM: CT HEAD WITHOUT CONTRAST TECHNIQUE: Contiguous axial images were obtained from the base of the skull through the vertex without intravenous contrast. COMPARISON:  MR brain 02/23/2017. FINDINGS: Brain: Widespread areas of cytotoxic edema are seen throughout the cortex of the LEFT frontal lobe, posterior frontal lobe, parietal lobe, and occipital lobe, with some involvement of the subcortical white matter. These are nonhemorrhagic. Multiple emboli with bland infarctions are suspected. No hemorrhage, mass lesion, midline shift, hydrocephalus, or extra-axial fluid. Vascular: No hyperdense vessel or unexpected calcification. Skull: Normal. Negative for fracture or focal lesion.  Sinuses/Orbits: No acute finding. Other: None. Compared with prior MR brain 02/23/2017, the findings represent an interval change. IMPRESSION: Findings consistent with multifocal, nonhemorrhagic, acute LEFT hemisphere infarcts, likely embolic. These lie predominantly with the LEFT MCA territory, with additional involvement of LEFT occipital lobe suggesting fetal type PCA anatomy. MRI brain without contrast could provide additional information. These results were called by telephone at the time of interpretation on 02/25/2017 at 8:53 am to [facility] , who verbally acknowledged these results. Electronically Signed   By: Staci Righter M.D.   On: 02/25/2017 08:55   Ct Soft Tissue Neck W Contrast  Result Date: 02/22/2017 CLINICAL DATA:  Left neck pain with swallowing. Lymphadenopathy of the head and neck. Odynophagia. EXAM: CT NECK WITH CONTRAST TECHNIQUE: Multidetector CT imaging of the neck was performed using the standard protocol following the bolus administration of intravenous contrast. CONTRAST:  80mL ISOVUE-300 IOPAMIDOL (ISOVUE-300) INJECTION 61% COMPARISON:  None. FINDINGS: Pharynx and larynx: There is mild prominence of the palatine tonsils bilaterally, left greater than right. No discrete mass lesion is present. The parapharyngeal fat is clear. Vocal cords are opposed. The patient is likely holding her breath. No other focal mucosal or submucosal lesions are evident. Salivary glands: The submandibular and parotid glands are within normal limits bilaterally. Thyroid: A 6 mm nodule in the upper pole of the left lobe of the thyroid measures 6 mm maximally. No other focal lesions are present. Lymph nodes: A right level 3 lymph node may represent 2 adjacent nodes. The nodal mass measures 22 x 6 x 8 mm. No other significant adenopathy is present. Vascular: A low-density clot  or plaque sits at the level of the left carotid bifurcation measuring 8 x 8 x 9 mm. There is thrombus extending into the left external  carotid artery. No other significant vascular disease is present. A three-vessel aortic arch is present. Limited intracranial: Negative Visualized orbits: The globes and orbits are within normal limits. Mastoids and visualized paranasal sinuses: The paranasal sinuses and mastoid air cells are clear. Skeleton: No focal lytic or blastic lesions are present. Vertebral body heights and alignment are normal. Upper chest: The lung apices demonstrate mild dependent atelectasis. No focal nodule, mass, or airspace disease is present. IMPRESSION: 1. 9 mm thrombus or plaque at the left carotid bifurcation with extension into the left external carotid artery. This could be related to the patient's left-sided neck pain. This may be from a central source will represent a contained dissection. The recommend emergent evaluation with consideration of anti coagulation to prevent intracranial embolization. 2. No other significant vascular disease. 3. Slight prominence of the palatine tonsils. No adjacent inflammatory change is evident. 4. Enlarged right level 3 lymph node is likely reactive. This may represent 2 adjacent nodes. These results were called by telephone at the time of interpretation on 02/22/2017 at 4:39 pm to Dr. Nelson Chimes , who verbally acknowledged these results. Electronically Signed   By: San Morelle M.D.   On: 02/22/2017 16:39   Mr Brain Wo Contrast  Result Date: 02/23/2017 CLINICAL DATA:  Initial evaluation for possible prior stroke. The why is the stab EXAM: MRI HEAD WITHOUT CONTRAST TECHNIQUE: Multiplanar, multiecho pulse sequences of the brain and surrounding structures were obtained without intravenous contrast. COMPARISON:  None. FINDINGS: Brain: Cerebral volume within normal limits. There is an apparent 8 mm focus of T2/FLAIR signal abnormality involving the peripheral cortical gray matter of the left temporal lobe (series 7, image 9), indeterminate. No associated diffusion abnormality.  Finding is of uncertain etiology, and may reflect a small focus of cerebral edema. Small infiltrating neoplasm could also conceivably have this appearance. No other focal parenchymal signal abnormality identified. No diffusion abnormality to suggest acute or subacute ischemia. No foci of susceptibility artifact to suggest acute or chronic intracranial hemorrhage. No encephalomalacia to suggest chronic infarction. No other mass lesion. No midline shift or mass effect. Ventricles normal in size without evidence for hydrocephalus. No extra-axial fluid collection. Major dural sinuses are patent. Pituitary gland suprasellar region within normal limits. Midline structures intact and normal. Vascular: Major intracranial vascular flow voids are well maintained. Skull and upper cervical spine: Craniocervical junction within normal limits. Visualized upper cervical spine unremarkable. Bone marrow signal intensity within normal limits. No scalp soft tissue abnormality. Sinuses/Orbits: Globes and orbital soft tissues within normal limits. Paranasal sinuses are clear. Trace opacity noted within the posterior right mastoid air cells. Mastoid air cells are otherwise clear. No mastoid effusion inner ear structures normal. Other: No other significant finding. IMPRESSION: 1. 8 mm focus of T2/FLAIR signal abnormality within the cortex of the left temporal lobe as above. Finding is indeterminate, with differential considerations consisting of a small focus of cerebral edema, possible tiny nonhemorrhagic cortical contusion (although no soft tissue swelling is seen within the overlying scalp), or possibly a small infiltrative neoplasm. Further evaluation with postcontrast imaging recommended for further evaluation. Additionally, performance of thin section T2 weighted imaging through this region would likely be helpful for further evaluation as well (seizure protocol). 2. Otherwise normal brain MRI. No evidence for acute or subacute  ischemia status post recent TIA. Electronically Signed  By: Jeannine Boga M.D.   On: 02/23/2017 20:09   Mr Jeri Cos BC Contrast  Result Date: 02/25/2017 CLINICAL DATA:  Initial evaluation for acute stroke. EXAM: MRI HEAD WITHOUT AND WITH CONTRAST TECHNIQUE: Multiplanar, multiecho pulse sequences of the brain and surrounding structures were obtained without and with intravenous contrast. CONTRAST:  35mL MULTIHANCE GADOBENATE DIMEGLUMINE 529 MG/ML IV SOLN COMPARISON:  Prior CT from earlier the same day as well as previous MRI from 02/23/2017. FINDINGS: Brain: Multifocal areas of ischemic infarcts are seen involving predominantly the left MCA territory. Areas of infarction are predominantly cortical in nature, and involve the left frontal and parietal lobes. These are suspected to be embolic in nature. Additional cortical infarct within the left occipital lobe, likely due to fetal type PCA. Associated petechial hemorrhage within the areas of infarction without hemorrhagic transformation. No significant mass effect. Scattered leptomeningeal enhancement also noted within the area of infarct. Previously noted approximate 1 cm FLAIR signal abnormality at the peripheral left temporal lobe is better seen on today's study, and appears to be extra-axial in nature. This demonstrates solid postcontrast enhancement, and is most consistent with a small meningioma. Lesion measures 10 x 8 x 10 mm. No associated edema. The otherwise stable appearance of the brain. Cerebral volume normal. No significant cerebral white matter disease. No other mass lesion or abnormal enhancement. No extra-axial fluid collection. Ventricles normal size without hydrocephalus. Major dural sinuses are patent. Pituitary gland and suprasellar region within normal limits. Vascular: Major intracranial vascular flow voids are maintained. Skull and upper cervical spine: Craniocervical junction within normal limits. Visualized upper cervical spine  unremarkable. Bone marrow signal intensity normal. No scalp soft tissue abnormality. Sinuses/Orbits: Globes and orbital soft tissues within normal limits. Paranasal sinuses are clear. No mastoid effusion. Inner ear structures normal. Other: No other significant finding. IMPRESSION: 1. Multifocal ischemic predominantly cortical left MCA territory infarcts involving the left frontal and parietal lobes. Again, these are suspected to be embolic in nature. Additional occipital lobe involvement likely due to fetal type PCA as previously mentioned. Associated petechial hemorrhage without frank hemorrhagic transformation. 2. 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 No associated edema. Electronically Signed   By: Jeannine Boga M.D.   On: 02/25/2017 23:32   Anti-infectives: Anti-infectives    Start     Dose/Rate Route Frequency Ordered Stop   02/24/17 2300  vancomycin (VANCOCIN) IVPB 1000 mg/200 mL premix     1,000 mg 200 mL/hr over 60 Minutes Intravenous Every 12 hours 02/24/17 1707 02/25/17 2259   02/24/17 1100  vancomycin (VANCOCIN) IVPB 1000 mg/200 mL premix     1,000 mg 200 mL/hr over 60 Minutes Intravenous On call to O.R. 02/23/17 1426 02/24/17 1210      Assessment/Plan: s/p Procedure(s): ENDARTERECTOMY CAROTID with removal of thrombus  (Left) Postop day 2 with probable diffuse embolus from thrombus in her carotid bifurcation. Discussed continued plan per stroke team evaluation. Continue with IV heparin for now. Can discontinue this prior to discharge and continue Plavix   Pathology still pending from thrombus removed from her carotid and lymph nodes removed at the time of surgery   LOS: 4 days   Leeandra Ellerson 02/26/2017, 11:13 AM

## 2017-02-26 NOTE — Evaluation (Signed)
Physical Therapy Evaluation Patient Details Name: Ashley Beard MRN: [id number] DOB: Feb 16, 1966 Today's Date: 02/26/2017   History of Present Illness  Pt admitted for L CEA now with new L MCA and PCA territory infarcts. Pt with hx of TIA 2-3 weeks ago.   Clinical Impression  Pt presented sitting OOB in recliner chair when therapist entered room. PT session dovetailed with OT evaluation. Pt's daughter was present throughout session as well. Prior to admission, pt was independent with all functional mobility and ADLs. She lives alone. Pt was slightly impulsive with transfers and required min A with ambulation for safety as pt now has R visual field cut with R sided inattention. Pt would be an excellent candidate for CIR when medically ready and will likely make great progress with intensive therapy services. All VSS throughout. PT will continue to follow acutely.    Follow Up Recommendations CIR    Equipment Recommendations  None recommended by PT    Recommendations for Other Services Rehab consult     Precautions / Restrictions Precautions Precautions: Fall Precaution Comments: R field cut and inattention/neglect Restrictions Weight Bearing Restrictions: No      Mobility  Bed Mobility               General bed mobility comments: pt OOB in recliner chair when therapist entered room  Transfers Overall transfer level: Needs assistance Equipment used: None Transfers: Sit to/from Stand Sit to Stand: Min guard         General transfer comment: pt a bit impulsive with transfers but no physical assistance needed, min guard for safety  Ambulation/Gait Ambulation/Gait assistance: Min assist Ambulation Distance (Feet): 300 Feet Assistive device: None Gait Pattern/deviations: Step-through pattern;Narrow base of support Gait velocity: decreased Gait velocity interpretation: Below normal speed for age/gender General Gait Details: pt with slow, cautious gait and with the visual  field loss pt required assistance for navigation in room and hallway  Stairs            Wheelchair Mobility    Modified Rankin (Stroke Patients Only) Modified Rankin (Stroke Patients Only) Pre-Morbid Rankin Score: No symptoms Modified Rankin: Slight disability     Balance Overall balance assessment: Needs assistance Sitting-balance support: Feet supported Sitting balance-Leahy Scale: Good Sitting balance - Comments: no LOB with LB dressing   Standing balance support: During functional activity;No upper extremity supported Standing balance-Leahy Scale: Fair Standing balance comment: min guard for safety                 Standardized Balance Assessment Standardized Balance Assessment : Dynamic Gait Index   Dynamic Gait Index Level Surface: Mild Impairment Change in Gait Speed: Mild Impairment Gait with Horizontal Head Turns: Moderate Impairment Gait with Vertical Head Turns: Moderate Impairment Gait and Pivot Turn: Mild Impairment Step Over Obstacle: Mild Impairment Step Around Obstacles: Moderate Impairment Steps: Moderate Impairment Total Score: 12       Pertinent Vitals/Pain Pain Assessment: No/denies pain    Home Living Family/patient expects to be discharged to:: Private residence Living Arrangements: Alone Available Help at Discharge: Friend(s);Available 24 hours/day;Family (Simultaneous filing. User may not have seen previous data.) Type of Home: Apartment (Simultaneous filing. User may not have seen previous data.) Home Access: Level entry     Home Layout: One level Home Equipment: Grab bars - toilet;Grab bars - tub/shower Additional Comments: Pt's apartment in handicap accessible.    Prior Function Level of Independence: Independent         Comments: works in [employer]  Hand Dominance   Dominant Hand: Right    Extremity/Trunk Assessment   Upper Extremity Assessment Upper Extremity Assessment: Defer to OT evaluation RUE Deficits  / Details: 3+/5, postures with ambulation RUE Coordination: decreased fine motor;decreased gross motor    Lower Extremity Assessment Lower Extremity Assessment: RLE deficits/detail;LLE deficits/detail RLE Deficits / Details: MMT revealed 4/5 for hip flexion, knee flexion, knee extension and ankle DF. Sensation grossly intact. LLE Deficits / Details: MMT revealed 4/5 for hip flexion, knee flexion, knee extension and ankle DF. Sensation grossly intact.    Cervical / Trunk Assessment Cervical / Trunk Assessment: Normal  Communication   Communication: Expressive difficulties  Cognition Arousal/Alertness: Awake/alert Behavior During Therapy: WFL for tasks assessed/performed Overall Cognitive Status: Within Functional Limits for tasks assessed                                 General Comments: appears generally intact as pt was able to follow commands consistently and provide some information regarding PLOF/home environment; however, pt's daughter answering questions for her and pt with expressive difficulties      General Comments      Exercises     Assessment/Plan    PT Assessment Patient needs continued PT services  PT Problem List Decreased balance;Decreased mobility;Decreased coordination;Decreased knowledge of use of DME;Decreased safety awareness;Decreased knowledge of precautions       PT Treatment Interventions DME instruction;Gait training;Stair training;Functional mobility training;Therapeutic activities;Therapeutic exercise;Balance training;Neuromuscular re-education;Patient/family education    PT Goals (Current goals can be found in the Care Plan section)  Acute Rehab PT Goals Patient Stated Goal: return to independence PT Goal Formulation: With patient/family Time For Goal Achievement: 03/12/17 Potential to Achieve Goals: Good    Frequency Min 4X/week   Barriers to discharge Decreased caregiver support      Co-evaluation                End of Session Equipment Utilized During Treatment: Gait belt Activity Tolerance: Patient tolerated treatment well Patient left: in chair;with call bell/phone within reach;with family/visitor present Nurse Communication: Mobility status PT Visit Diagnosis: Unsteadiness on feet (R26.81);Other abnormalities of gait and mobility (R26.89);Other symptoms and signs involving the nervous system (R29.898)    Time: 1005-1026 PT Time Calculation (min) (ACUTE ONLY): 21 min   Charges:   PT Evaluation $PT Eval Moderate Complexity: 1 Procedure     PT G Codes:        Sherie Don, PT, DPT [place] 02/26/2017, 1:46 PM

## 2017-02-26 NOTE — Progress Notes (Addendum)
STROKE TEAM PROGRESS NOTE   SUBJECTIVE (INTERVAL HISTORY) Her daughter is at the bedside.  Patient up in the chair at the bedside.    OBJECTIVE Temp:  [98.1 F (36.7 C)-99.4 F (37.4 C)] 98.1 F (36.7 C) (03/23 0700) Pulse Rate:  [68-101] 68 (03/23 0714) Cardiac Rhythm: Normal sinus rhythm (03/23 0701) Resp:  [11-19] 19 (03/23 0325) BP: (125-151)/(76-88) 125/83 (03/23 0714) SpO2:  [98 %-100 %] 100 % (03/23 0714)  CBC:  Recent Labs Lab 02/22/17 2001  02/24/17 0433 02/25/17 0416  WBC 10.0  < > 7.5 12.0*  NEUTROABS 6.2  --   --   --   HGB 13.8  < > 13.4 12.2  HCT 39.8  < > 39.3 35.9*  MCV 94.5  < > 95.4 95.2  PLT 125*  < > 131* 96*  < > = values in this interval not displayed.  Basic Metabolic Panel:  Recent Labs Lab 02/24/17 0433 02/25/17 0416  NA 139 136  K 3.9 4.0  CL 101 102  CO2 30 23  GLUCOSE 91 110*  BUN 7 6  CREATININE 0.94 0.72  CALCIUM 8.9 8.4*   HgbA1c: No results found for: HGBA1C Urine Drug Screen: No results found for: LABOPIA, COCAINSCRNUR, LABBENZ, AMPHETMU, THCU, [place] Pleasant middle aged african [place] lady not in distress. . Afebrile. Head is nontraumatic. Neck is supple without bruit.    Cardiac exam no murmur or gallop. Lungs are clear to auscultation. Distal pulses are well felt. Left CEA scar in neck Neurological Exam ; Awake alert oriented 3 with normal speech and language function. No aphasia or apraxia or dysarthria. Extraocular movements are full range without nystagmus. Fundi were not visualized. Vision acuity seems adequate. Tongue is midline. Motor system exam reveals no upper or lower extremity drift but mild weakness of right grip and intrinsic hand muscles. Orbits left over right upper extremity. Findings movements are diminished on the right. Lower extremity strength is symmetric. Deep tendon reflexes 1+ symmetric. Plantars downgoing. Sensation intact bilaterally. Gait was not  tested.   ASSESSMENT/PLAN Ashley Beard is a 51 y.o. female with history of L CEA 3/21 where clot was seen in ICA with post op aphasia and field cut. She did not receive IV t-PA due to post op status.   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 L temporal meningioma  Carotid Doppler  R ICA 1-39%, R ICA 80-99% stenosis. VA antegrade  Cerebral angio 50% stenosis L CEA site with 2-3 small filling deficits representing clots. No intracranial filling defects or occlusions noted.  2D Echo  EF 60-65% stenosis  LDL 62. Not on statin PTA  HgbA1c pending Pathology from thrombus pending   IV heparin for VTE prophylaxis  Diet regular Room service appropriate? Yes; Fluid consistency: Thin  aspirin 325 mg daily prior to admission, now on aspirin 325 mg daily, clopidogrel 75 mg daily and heparin IV. Dr. Leonie Man to call and discuss antithrombotics with DR. Early. See Sethi's addition below  Therapy recommendations:  CIR. Consult placed  Disposition:  pending   Other Stroke Risk Factors  Former Cigarette  smoker  ETOH use  Other Active Problems  Leukocytosis 12  Thrombocytopenia 96  Hypocalcemia 8.4  Hospital day # [place] for Pager information 02/26/2017 1:33 PM  I have personally examined this patient, reviewed notes, independently viewed imaging studies, participated in medical decision making and plan of care.ROS completed by me personally and pertinent positives fully documented  I have made any additions or clarifications directly to the above note. Agree with note  above. She developed left hemispheric infarcts following left carotid surgery. I expect gradual improvement. Discontinue IV heparin and continue aspirin and Plavix for 3 months followed by aspirin alone. Transfer to inpatient rehabilitation when bed available. Discussed with patient, daughter and Dr. Donnetta Hutching. Greater than 50% time during this 35 minute visit was spent on counseling and coordination of care about her stroke, treatment discussion and answering questions. Stroke team will sign off. Please call for any questions. Follow up in 6 weeks. Order written.  Antony Contras, MD Medical Director [facility] Pager: [phone] 02/26/2017 3:22 PM  To contact Stroke Continuity provider, please refer to [email or web address] After hours, contact General Neurology

## 2017-02-26 NOTE — Progress Notes (Signed)
Rehab Admissions Coordinator Note:  Patient was screened by Retta Diones for appropriateness for an Inpatient Acute Rehab Consult.  At this time, we are recommending Inpatient Rehab consult.  Retta Diones 02/26/2017, 12:29 PM  I can be reached at [phone].

## 2017-02-27 LAB — CBC WITH DIFFERENTIAL/PLATELET
Basophils Absolute: 0 K/uL (ref 0.0–0.1)
Basophils Relative: 0 %
Eosinophils Absolute: 0.3 K/uL (ref 0.0–0.7)
Eosinophils Relative: 3 %
HCT: 34.4 % — ABNORMAL LOW (ref 36.0–46.0)
Hemoglobin: 11.4 g/dL — ABNORMAL LOW (ref 12.0–15.0)
Lymphocytes Relative: 18 %
Lymphs Abs: 1.3 K/uL (ref 0.7–4.0)
MCH: 31.3 pg (ref 26.0–34.0)
MCHC: 33.1 g/dL (ref 30.0–36.0)
MCV: 94.5 fL (ref 78.0–100.0)
Monocytes Absolute: 0.7 K/uL (ref 0.1–1.0)
Monocytes Relative: 9 %
Neutro Abs: 5.3 K/uL (ref 1.7–7.7)
Neutrophils Relative %: 70 %
Platelets: 126 K/uL — ABNORMAL LOW (ref 150–400)
RBC: 3.64 MIL/uL — ABNORMAL LOW (ref 3.87–5.11)
RDW: 12.1 % (ref 11.5–15.5)
WBC: 7.5 K/uL (ref 4.0–10.5)

## 2017-02-27 LAB — HEMOGLOBIN A1C
Hgb A1c MFr Bld: 4.9 % (ref 4.8–5.6)
Mean Plasma Glucose: 94 mg/dL

## 2017-02-27 NOTE — Progress Notes (Signed)
Patient to transfer to 2W03 report given to receiving nurse, all questions answered at this time.  Pt. VSS with no s/s of distress noted.  Patient stable for transfer.

## 2017-02-27 NOTE — Progress Notes (Addendum)
[facility]  Subjective  - Very pleasant.  She is not quit clear about stroke event.   Objective 128/82 68 98.5 F (36.9 C) (Oral) 18 100%  Intake/Output Summary (Last 24 hours) at 02/27/17 0827 Last data filed at 02/27/17 0323  Gross per 24 hour  Intake          1005.88 ml  Output              650 ml  Net           355.88 ml   Neck soft without hematoma, incision healing well Right UE drift, grip 5/5 equal Alert and oriented to person, place.  Needs things re explained about stroke event Palpable radial pulses B No tongue deviation and smille is symmetric   Assessment/Planning: POD # 2 left CEA Speech eval complete recommending CIR in the future, no swallowing difficulty. Still very clumsy with her right arm Heparin has been stopped, continue Plavix and aspirin  Transfer to 2W Continue stroke work up  Pathology from thrombus pending   Laurence Slate [facility] 02/27/2017 8:27 AM --  Laboratory Lab Results:  Recent Labs  02/25/17 0416 02/27/17 0301  WBC 12.0* 7.5  HGB 12.2 11.4*  HCT 35.9* 34.4*  PLT 96* 126*   BMET  Recent Labs  02/25/17 0416  NA 136  K 4.0  CL 102  CO2 23  GLUCOSE 110*  BUN 6  CREATININE 0.72  CALCIUM 8.4*    COAG Lab Results  Component Value Date   INR 0.88 02/22/2017   No results found for: PTT   I have independently interviewed patient and agree with PA assessment and plan above. Now on 2W. Plavix. Pending CIR.   Vaneta Hammontree C. Donzetta Matters, MD [facility] [place] Office: [phone] Pager: [phone]

## 2017-02-27 NOTE — Plan of Care (Signed)
Problem: Education: Goal: Knowledge of [facility] Education information/materials will improve Outcome: Progressing Educated atient s/s of stroke

## 2017-02-27 NOTE — Plan of Care (Signed)
Problem: Education: Goal: Knowledge of disease or condition will improve Outcome: Progressing Discussed and reinforced s/s of CVA pr verbalized understanding

## 2017-02-28 NOTE — Progress Notes (Addendum)
[facility] [place]  Subjective  - Doing well.  Alert and oriented to stroke and surgery.   Objective (!) 145/81 89 98.8 F (37.1 C) (Oral) 16 99%  Intake/Output Summary (Last 24 hours) at 02/28/17 0728 Last data filed at 02/27/17 1800  Gross per 24 hour  Intake              460 ml  Output              200 ml  Net              260 ml    Right arm drift.  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 Pt would be an excellent candidate for CIR when medically ready and will likely make great progress with intensive therapy services. All VSS throughout. PT will continue to follow acutely. Recommendations for CIR Continue Plavix and 325 mg Aspirin  COLLINS, EMMA MAUREEN 02/28/2017 7:28 AM --  Laboratory Lab Results:  Recent Labs  02/27/17 0301  WBC 7.5  HGB 11.4*  HCT 34.4*  PLT 126*   BMET No results for input(s): NA, K, CL, CO2, GLUCOSE, BUN, CREATININE, CALCIUM in the last 72 hours.  COAG Lab Results  Component Value Date   INR 0.88 02/22/2017   No results found for: PTT  I have independently interviewed patient and agree with PA assessment and plan above. Pending CIR.   Beverley Allender C. Donzetta Matters, MD [facility] [place] Office: [phone] Pager: [phone]

## 2017-03-01 MED ORDER — OXYCODONE-ACETAMINOPHEN 5-325 MG PO TABS: 1.0000 | ORAL_TABLET | Freq: Four times a day (QID) | ORAL | 0 refills | Status: DC | PRN

## 2017-03-01 MED ORDER — CLOPIDOGREL BISULFATE 75 MG PO TABS: 75.0000 mg | ORAL_TABLET | Freq: Every day | ORAL | 2 refills | Status: DC

## 2017-03-01 NOTE — Discharge Summary (Signed)
Discharge Summary     Ashley Beard 1966/09/04 51 y.o. 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 female who is seen today for evaluation of thrombus in her left carotid artery. She is otherwise healthy 51 year old female. She has had a several month history of discomfort and a burning sensation with swallowing. Also had was felt to be a sinus infection and was treated for this. This is failed to resolve and she saw her primary care physician today for continued evaluation. Patient underwent CT scan as an outpatient for this. The scan did not show any cause for the swallowing difficulty but did show a finding of a large clot located in her left carotid bifurcation with extension into her left external carotid artery. I was consulted and suggested the patient be sent to [facility] for admission anticoagulation and further workup and surgery. I'm seeing the patient in the emergency department and her 2 daughters are present with her. She does not have any history of other clotting disorders. Her only past medical history is significant for anxiety. She is right-handed. On further questioning regarding any neurologic deficits, he does relate a event approximately 2-3 weeks ago where she had limp and numbness in her right arm. This lasted for several minutes and then not completely resolved. She is not had any speech difficulties and no right leg difficulty. She has been completely neurologically intact otherwise. She has no history of cardiac irregularity.  Hospital Course:  The patient was admitted to the hospital and placed on a heparin gtt.  She underwent and echo and MRI with the following results:  2D Echo 02/23/17: Study Conclusions  - Left ventricle: The cavity size was normal. Wall thickness was   normal. Systolic function was  normal. The estimated ejection   fraction was in the range of 60% to 65%. Wall motion was normal;   there were no regional wall motion abnormalities. Left   ventricular diastolic function parameters were normal. - Aortic valve: Mildly calcified annulus.  MRI 02/25/17: IMPRESSION: 1. Multifocal ischemic predominantly cortical left MCA territory infarcts involving the left frontal and parietal lobes. Again, these are suspected to be embolic in nature. Additional occipital lobe involvement likely due to fetal type PCA as previously mentioned. Associated petechial hemorrhage without frank hemorrhagic transformation. 2. 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 No associated edema.  She was taken to the operating room on 02/24/2017 and underwent left carotid endarterectomy.  The pt tolerated the procedure well and was transported to the PACU in good condition.  In the recovery room, she was sleepy, but awakened easily to voice.  Moving all extremities equally.  Her heparin was discontinued.    By POD 1, the pt neuro status was not in tact.  She was having right sided neglect.  Clearly not at her preoperative baseline. Was neurologically intact in the recovery room following surgery. This morning having some difficulty with word searching. These is able to answer questions and can read from a chart. Right hand seems clumsy. Was up walking with the nurse earlier not normally. Discussed with Dr.Oster for neurologic consultation. He recommended stat CT scan of her head. 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 Repeat carotid  duplex pending. Will start IV heparin anticoagulation. No evidence of bleed on CT of her head  Neuro saw the pt with the following assessment: 1. Acute Ischemic Stroke: This is an acute stroke involving the L MCA and L PCA territores. It is most  likely embolic in etiology, in this case due to LICA pathology and intervention. Involvement of the L PCA territory would suggest  persistent fetal origin of that vessel. She does not have any of the traditional stroke risk factors. Carotid Dopplers this morning showed elevated velocities in the LICA concerning for possible stenosis of the distal portion of the vessel. She is to undergo urgent carotid arteriogram for further evaluation.TTE was unremarkable. I will order MRI brain without contrast,  fasting lipids, and hemoglobin a1c. Continue aspirin for secondary stroke prevention. Caution should be used with anticoagulation given embolic nature of strokes as these have a greater propensity for hemorrhagic conversion. Allow permissive hypertension in the acute phase, treating only SBP greater than 220 mmHg and/or DBP greater than 110 mmHg as needed. Avoid fever and hyperglycemia as these can extend the infarct. Avoid hypotonic IVF to minimize exacerbation of post-stroke edema. Initiate rehab services. DVT prophylaxis as needed.   2. 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 SLP/rehab.  3. 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 PT/OT/rehab.  4. RUE numbness: This is acute, due to stroke. PT/OT/rehab.   5. Right hemineglect: This is acute, due to stroke. This is mild and consists mainly of some inattention to the right side. PT/OT/rehab.    6. Abnormal MRI brain: Prior scan on 02/23/17 showed a focal area of increased signal in the L temporal lobe that is of uncertain significance. I will repeat the MRI with and without contrast to further evaluate both her stroke and this lesion.   Fall risk: She is at increased risk of falls due to her heminegelct, hemianopsia, and sensory changes. Fall precautions. Limit psychoactive medications and sedating medications. The remainder of the hospital course consisted of increasing mobilization and increasing intake of solids without  difficulty.  She was taken to IR and underwent a cerebral angiogram with the following findings:  1.  approx 50 % stenosis at distal aspect of Lt  endarterectomy suture  site  associated 2 to 3 small filling defects prox probably representing clots. 2.  NO filling defects  Or  occlusions noted intracranially.  Dr. Donnetta Hutching reviewed results with the following assessment:  Cerebral arteriogram reviewed and discussed with Dr.Deveshwar.  Was a patent endarterectomy and patch segment. Mild irregularity and internal carotid distal to the patch. This is not flow limiting.  Ask Dr. Scot Dock to review for another opinion regarding this. I we do not feel that there is any indication for treatment other than anticoagulation for this. This is mildly irregular and is not flow limiting. Would much more likely cause more damage to explore this area.  Discussed at length with the patient, 2 daughters and other family members currently. She is at her same baseline as this morning from a neurologic standpoint. Does answer questions and moves all 4. Is status slow in responding.  Recommend continued stroke evaluation. Suspect that most likely she embolized this friable thrombus that was in her bifurcation at the time of surgery. Family reports that she was responding similarly last evening.  Neuro saw pt 02/26/17 with the following:  She developed left hemispheric infarcts following left carotid surgery. I expect gradual improvement. Discontinue IV heparin and continue aspirin and Plavix  for 3 months followed by aspirin alone. Transfer to inpatient rehabilitation when bed available. Discussed with patient, daughter and Dr. Donnetta Hutching. Greater than 50% time during this 35 minute visit was spent on counseling and coordination of care about her stroke, treatment discussion and answering questions. Stroke team will sign off. Please call for any questions. Follow up in 6 weeks. Order written.  She has been evaluated by PT and  HHPT is recommended.   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 Pt was able to follow complex commands with slightly extra time needed if dealing with visual information due to right field deficits. Pt was able to name 100% of objects, pictures, body parts in confronational tasks and was 100% accurate with responsive and divergent naming tasks. When describing pictures pt had increased difficulty with word finding with nouns and verbs, but used circomlocution successfully and responded well to phonemic cues. Primary problem is with grammatical structures for complete sentences, communication is telegraphic. When given a model for a full sentence and then min verbal cues for pronouns pt was able to extend speech about her dog into complete sentences x5. Pt also able to describe a picture in complete sentences with moderate verbal cues for sentence structure. Pt will need ongoing therapeutic interventions for language, but also safety given visualspatial deficits. Strongly recommend CIR given good potential for rehabilition and improved safety.       Surgical Pathology 02/24/17: 1. Lymph node, biopsy, Left Neck - BENIGN REACTIVE LYMPH NODE. - NO EVIDENCE OF MALIGNANCY. 2. Lymph node, biopsy, Left Neck #2 - BENIGN REACTIVE LYMPH NODE. - NO EVIDENCE OF MALIGNANCY. 3. Artery, biopsy, Left Carotid Intra-Arterial Mass - BLOOD CLOT. - NO EVIDENCE OF MALIGNANCY. 4. Carotid artery, plaque, Left - ATHEROSCLEROTIC PLAQUE WITH HEMORRHAGE AND SLIGHT INFLAMMATION. - NO EVIDENCE OF MALIGNANCY.   CBC    Component Value Date/Time   WBC 7.5 02/27/2017 0301   RBC 3.64 (L) 02/27/2017 0301   HGB 11.4 (L) 02/27/2017 0301   HCT 34.4 (L) 02/27/2017 0301   PLT 126 (L) 02/27/2017 0301   MCV 94.5 02/27/2017 0301   MCH 31.3 02/27/2017 0301   MCHC 33.1 02/27/2017 0301   RDW 12.1 02/27/2017 0301   LYMPHSABS 1.3 02/27/2017 0301   MONOABS  0.7 02/27/2017 0301   EOSABS 0.3 02/27/2017 0301   BASOSABS 0.0 02/27/2017 0301   BMET    Component Value Date/Time   NA 136 02/25/2017 0416   K 4.0 02/25/2017 0416   CL 102 02/25/2017 0416   CO2 23 02/25/2017 0416   GLUCOSE 110 (H) 02/25/2017 0416   BUN 6 02/25/2017 0416   CREATININE 0.72 02/25/2017 0416   CREATININE 0.98 02/22/2017 1047   CALCIUM 8.4 (L) 02/25/2017 0416   GFRNONAA >60 02/25/2017 0416   GFRNONAA 68 02/22/2017 1047   GFRAA >60 02/25/2017 0416   GFRAA 78 02/22/2017 1047     Discharge Instructions    Ambulatory referral to Neurology    Complete by:  As directed    Stroke patient. Dr. Leonie Man prefers follow up in 6 weeks   CAROTID [place]: Call MD for difficulty swallowing or speaking; weakness in arms or legs that is a new symtom; severe headache.  If you have increased swelling in the neck and/or  are having difficulty breathing, CALL 911    Complete by:  As directed    Call MD for:  redness, tenderness, or signs of infection (pain, swelling, bleeding, redness, odor  or green/yellow discharge around incision site)    Complete by:  As directed    Call MD for:  severe or increased pain, loss or decreased feeling  in affected limb(s)    Complete by:  As directed    Call MD for:  temperature >100.5    Complete by:  As directed    Discharge wound care:    Complete by:  As directed    Shower daily with soap and water starting 02/26/17.   Driving Restrictions    Complete by:  As directed    No driving for 2 weeks   Lifting restrictions    Complete by:  As directed    No lifting for 2 weeks   Resume previous diet    Complete by:  As directed       Discharge Diagnosis:  Carotid artery dissection ([place]) [I77.71]  Secondary Diagnosis: Patient Active Problem List   Diagnosis Date Noted  . Cerebral thrombosis with cerebral infarction 02/26/2017  . Odynophagia 02/22/2017  . Lymphadenopathy of head and neck 02/22/2017  . Elevated blood pressure reading 02/22/2017    . Carotid artery disease ([place]) 02/22/2017   Past Medical History:  Diagnosis Date  . Abnormal Pap smear of cervix    age 89  . Anxiety   . Carotid arterial disease ([place])   . Refusal of blood transfusions as patient is Jehovah's Witness     Allergies as of 03/02/2017      Reactions   Penicillins Hives, Other (See Comments)   Nightmares Has patient had a PCN reaction causing immediate rash, facial/tongue/throat swelling, SOB or lightheadedness with hypotension: Yes Has patient had a PCN reaction causing severe rash involving mucus membranes or skin necrosis: No Has patient had a PCN reaction that required hospitalization pt was in the hospital at time of reaction Has patient had a PCN reaction occurring within the last 10 years: No If all of the above answers are "NO", then may proceed with Cephalosporin use.   Fish Allergy Itching, Swelling   Lymph node swelling   Other Itching, Swelling   Reaction to blue cheese Lymph node swelling   Shrimp [shellfish Allergy] Itching, Swelling   Lymph node swelling   Yeast-related Products Itching, Swelling   Lymph node swelling      Medication List    STOP taking these medications   cefdinir 300 MG capsule Commonly known as:  OMNICEF     TAKE these medications   albuterol 108 (90 Base) MCG/ACT inhaler Commonly known as:  PROVENTIL HFA;VENTOLIN HFA Inhale 2 puffs into the lungs every 4 (four) hours as needed for wheezing or shortness of breath.   aspirin EC 325 MG tablet Take 325 mg by mouth daily.   clopidogrel 75 MG tablet Commonly known as:  PLAVIX Take 1 tablet (75 mg total) by mouth daily.   feeding supplement (ENSURE ENLIVE) Liqd Take 237 mLs by mouth 2 (two) times daily between meals.   OVER THE COUNTER MEDICATION Take 1 tablet by mouth at bedtime as needed (allergies/sleep). OTC antihistamine   oxyCODONE-acetaminophen 5-325 MG tablet Commonly known as:  PERCOCET/ROXICET Take 1 tablet by mouth every 6 (six) hours as  needed for moderate pain.       Prescriptions given: 1.  Roxicet #8 No Refill 2.  Plavix 75mg  daily #30 2RF  Instructions: 1.  No driving x 2 weeks & while taking pain medication 2.  No heavy lifting x 1 week 3.  Shower daily with soap and  water starting 03/01/17  Disposition: home  Patient's condition: is Good  Follow up: 1. Dr. Donnetta Hutching in 2 weeks.   Leontine Locket, PA-C [facility] [phone]   --- For [facility] use ---   Modified Rankin score at D/C (0-6): 1  IV medication needed for:  1. Hypertension: No 2. Hypotension: No  Post-op Complications: Yes  1. Post-op CVA or TIA: Yes  If yes: Event classification (right eye, left eye, right cortical, left cortical, verterobasilar, other): left right hemi-neglect; RUE numbness; Right HH, resultant aphasia  If yes: Timing of event (intra-op, <6 hrs post-op, >=6 hrs post-op, unknown): unknown  2. CN injury: No  If yes: CN n/a injuried   3. Myocardial infarction: No  If yes: Dx by (EKG or clinical, Troponin): n/a  4.  CHF: No  5.  Dysrhythmia (new): No  6. Wound infection: No  7. Reperfusion symptoms: No  8. Return to OR: No  If yes: return to OR for (bleeding, neurologic, other CEA incision, other): n/a  Discharge medications: Statin use:  No   If No:   ASA use:  Yes  If No:   Beta blocker use:  No ACE-Inhibitor use:  No  ARB use:  No CCB use: No P2Y12 Antagonist use: Yes, [x ] Plavix, [ ]  Plasugrel, [ ]  Ticlopinine, [ ]  Ticagrelor, [ ]  Other, [ ]  No for medical reason, [ ]  Non-compliant, [ ]  Not-indicated Anti-coagulant use:  No, [ ]  Warfarin, [ ]  Rivaroxaban, [ ]  Dabigatran,

## 2017-03-01 NOTE — Progress Notes (Signed)
qPhysical Therapy Treatment Patient Details Name: Ashley Beard MRN: [id number] DOB: 10/14/1966 Today's Date: 03/01/2017    History of Present Illness Pt admitted for L CEA now with new L MCA and PCA territory infarcts. Pt with hx of TIA 2-3 weeks ago.     PT Comments    Pt progressing well toward goals. Pt able to compensate well for R visual field cut throughout session. Pt at moderate risk of falls as indicated by score of 19/24 on DGI. Recommend d/c home with 24 hour supervision with OP PT to address balance for safe transition home. Pt confirms her mother can stay with her upon d/c when medically ready. PT will continue to follow.    Follow Up Recommendations  Outpatient PT;Supervision/Assistance - 24 hour     Equipment Recommendations  None recommended by PT    Recommendations for Other Services       Precautions / Restrictions Precautions Precautions: Fall Precaution Comments: R field cut and inattention/neglect Restrictions Weight Bearing Restrictions: No    Mobility  Bed Mobility               General bed mobility comments: Pt in recliner upon arrival  Transfers Overall transfer level: Needs assistance Equipment used: None Transfers: Sit to/from Stand Sit to Stand: Supervision         General transfer comment: supervision for safety  Ambulation/Gait Ambulation/Gait assistance: Min guard Ambulation Distance (Feet): 300 Feet Assistive device: None Gait Pattern/deviations: WFL(Within Functional Limits) Gait velocity: decreased Gait velocity interpretation: Below normal speed for age/gender General Gait Details: cautious due to visual field cut on R with slower gait speed, however, able to increase gait speed with verbal cues. min guard for safety   Stairs            Wheelchair Mobility    Modified Rankin (Stroke Patients Only) Modified Rankin (Stroke Patients Only) Pre-Morbid Rankin Score: No symptoms Modified Rankin: Slight  disability     Balance Overall balance assessment: Needs assistance Sitting-balance support: Feet supported;No upper extremity supported Sitting balance-Leahy Scale: Normal     Standing balance support: No upper extremity supported;During functional activity Standing balance-Leahy Scale: Good Standing balance comment: able to don/doff back gown in standing without LOB                 Standardized Balance Assessment Standardized Balance Assessment : Dynamic Gait Index   Dynamic Gait Index Level Surface: Mild Impairment Change in Gait Speed: Normal Gait with Horizontal Head Turns: Mild Impairment Gait with Vertical Head Turns: Normal Gait and Pivot Turn: Mild Impairment Step Over Obstacle: Mild Impairment Step Around Obstacles: Mild Impairment Steps: Normal Total Score: 19      Cognition Arousal/Alertness: Awake/alert Behavior During Therapy: WFL for tasks assessed/performed Overall Cognitive Status: Within Functional Limits for tasks assessed                                        Exercises      General Comments General comments (skin integrity, edema, etc.): Pt reports being fearful of R cervical rotation secondary to incision, but was encouraged to start with small rotational movements to tolerance and increase amount of rotation as able.       Pertinent Vitals/Pain Pain Assessment: No/denies pain    Home Living  Prior Function            PT Goals (current goals can now be found in the care plan section) Acute Rehab PT Goals Patient Stated Goal: return to independence Progress towards PT goals: Progressing toward goals    Frequency    Min 4X/week      PT Plan Discharge plan needs to be updated    Co-evaluation             End of Session Equipment Utilized During Treatment: Gait belt Activity Tolerance: Patient tolerated treatment well Patient left: in chair;with call bell/phone within  reach Nurse Communication: Mobility status PT Visit Diagnosis: Other abnormalities of gait and mobility (R26.89);Other symptoms and signs involving the nervous system (R29.898)     Time: 3704-8889 PT Time Calculation (min) (ACUTE ONLY): 22 min  Charges:  $Gait Training: 8-22 mins                    G Codes:       Tracie Harrier, SPT Acute Rehab SPT [phone]     Tracie Harrier 03/01/2017, 2:53 PM

## 2017-03-01 NOTE — Progress Notes (Signed)
Occupational Therapy Treatment Patient Details Name: Ashley Beard MRN: [id number] DOB: 26-Dec-1965 Today's Date: 03/01/2017    History of present illness Pt admitted for L CEA now with new L MCA and PCA territory infarcts. Pt with hx of TIA 2-3 weeks ago.    OT comments  Pt making significant progress although she continues to demonstrate deficits with fine motor control/coordination, visual deficits and attention, in addition to below deficits. Recommend pt follow up with OT at the neuro outpt center to facilitate return to PLOF and successful return to work. Recommend pt refrain from driving at this time. Daughter present for session and verbalized understanding of POC. Will continue to follow acutely.   Follow Up Recommendations  Supervision/Assistance - 24 hour;Outpatient OT (neuro outpt OT)    Equipment Recommendations  None recommended by OT    Recommendations for Other Services      Precautions / Restrictions Precautions Precaution Comments: R field cut and inattention/neglect       Mobility Bed Mobility Overal bed mobility: Modified Independent                Transfers Overall transfer level: Modified independent                    Balance Overall balance assessment: No apparent balance deficits (not formally assessed)                                         ADL either performed or assessed with clinical judgement   ADL Overall ADL's : Needs assistance/impaired                                     Functional mobility during ADLs: Supervision/safety General ADL Comments: Pt overall S with all basic ADL tasks  Family recommended to S pt with all financial and medication management. Educated regarding need to refrain from driving. Pt verbalized understanding.      Vision   Additional Comments: Per pt, vision has improved. Pt with visual inattention, however, fields appear grossly intact. will further assess    Perception     Praxis      Cognition Arousal/Alertness: Awake/alert Behavior During Therapy: WFL for tasks assessed/performed Overall Cognitive Status:  (need to further assess)  Need to further assess calculation/money management                                 General Comments: PTA, pt worked in [employer]. Need to further assess. Appears to have attentional deficits. Difficulty coleting cancellation tasks when distraction increased. Notable increase in errors.        Exercises Other Exercises Other Exercises: fine motor/coordination Other Exercises: BUE integrated activities Other Exercises: visual scanning tasks Other Exercises: activities to address visual attention   Shoulder Instructions       General Comments      Pertinent Vitals/ Pain       Pain Assessment: No/denies pain  Home Living                                          Prior Functioning/Environment  Frequency  Min 3X/week        Progress Toward Goals  OT Goals(current goals can now be found in the care plan section)  Progress towards OT goals: Progressing toward goals  Acute Rehab OT Goals Patient Stated Goal: return to independence OT Goal Formulation: With patient Time For Goal Achievement: 03/12/17 Potential to Achieve Goals: Good ADL Goals Pt Will Perform Grooming: with modified independence;standing Pt Will Perform Upper Body Bathing: with modified independence;standing Pt Will Perform Lower Body Bathing: with modified independence;sit to/from stand Pt Will Transfer to Toilet: with modified independence;ambulating Pt Will Perform Toileting - Clothing Manipulation and hygiene: with modified independence;sit to/from stand Pt Will Perform Tub/Shower Transfer: Shower transfer;ambulating;with modified independence Pt/caregiver will Perform Home Exercise Program: Right Upper extremity;Increased strength;Independently;With written HEP  provided Additional ADL Goal #1: Pt will utilize compensatory strategies to locate visual targets/ ADL items in R hemispace.  Plan Discharge plan needs to be updated    Co-evaluation                 End of Session    Symptoms and signs involving cognitive functions: Cerebral infarction Hemiplegia - Right/Left: Right Hemiplegia - dominant/non-dominant: Dominant Hemiplegia - caused by: Cerebral infarction   Activity Tolerance Patient tolerated treatment well   Patient Left in bed;with call bell/phone within reach;with family/visitor present   Nurse Communication Mobility status        Time: 6834-1962 OT Time Calculation (min): 36 min  Charges: OT General Charges $OT Visit: 1 Procedure OT Treatments $Therapeutic Activity: 23-37 mins  [facility], OT/L  [phone] 03/01/2017   Aida Lemaire,HILLARY 03/01/2017, 4:58 PM

## 2017-03-01 NOTE — Progress Notes (Signed)
  Speech Language Pathology Treatment: Cognitive-Linquistic  Patient Details Name: Ashley Beard MRN: [id number] DOB: 1966/04/02 Today's Date: 03/01/2017 Time: 3570-1779 SLP Time Calculation (min) (ACUTE ONLY): 18 min  Assessment / Plan / Recommendation Clinical Impression  Ashley Beard was alert and pleasant throughout aphasia treatment and stated that she is going to inpatient rehab for further treatment, but is not sure when that will be. Treatment today focused on syntax and increasing her utterance lengths/fluency. Ashley Beard reported that her goal is to "elaborate more". She stated that she currently works at a bank and spends a lot of time on the talking on the telephone, which she fears returning to. Picture description tasks were utilized to target increasing verbal output. She required min verbal cues to add more details and elaboration to her picture descriptions. Ashley Beard completed sequencing description tasks (PB&J and her favorite recipe) with min-mod verbal cues to expand on her ideas, details, and steps. Observed mild word finding difficulties (for the word "slice of bread"), however she was able to produce the word when provided with wait-time. Comprehension was intact throughout the treatment session. Speech/langugae therapy during her stay in inpatient rehabilitation would be beneficial to target more functional and complex expressive language tasks. ST will continue to f/u briefly while pt is in acute care.   HPI HPI: This is a 51 year old woman who underwent a left carotid endarterectomy on 02/24/17. Surgery was uneventful. However, the next morning, she was noted to have some neurological changes. 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       SLP Plan  Continue with current plan of care       Recommendations                   General recommendations: Rehab  consult Oral Care Recommendations: Oral care BID Follow up Recommendations: Inpatient Rehab SLP Visit Diagnosis: Aphasia (R47.01) Plan: Continue with current plan of care       [place] , [place] 03/01/2017, 2:43 PM

## 2017-03-01 NOTE — Care Management Note (Signed)
Case Management Note Previous CM note initiated by Zenon Mayo, RN--02/25/2017, 4:54 PM   Patient Details  Name: Cionna Collantes MRN: [id number] Date of Birth: [birth year]  Subjective/Objective:  From home alone, pta indep, s/p CEA now with CVA, await pt/ot eval.  She has medication coverage , PCP and transportation at dc.  She has 2 daughters that live her in [place] and one sister that could assist her if needed.  NCM will cont to follow for dc needs.                   Action/Plan:   Expected Discharge Date:     03/02/17             Expected Discharge Plan:  Home/Self Care  In-House Referral:     Discharge planning Services  CM Consult  Post Acute Care Choice:    Choice offered to:  Patient  DME Arranged:    DME Agency:     HH Arranged:    [place] Agency:     Status of Service:  Completed, signed off  If discussed at [employer] of Stay Meetings, dates discussed:    Additional Comments:  03/01/17- 1400- Markiesha Delia RN, CM- pt has progressed with therapies- and now recommendations for outpt neuro rehab- per Genie with CIR pt will not need IP rehab and they will not be offering a bed- pt can return home with family- have spoken with pt at bedside who states her mom will assist her at discharge- no DME needs noted or recommended- discussed with pt outpt neuro rehab options- as pt lives in [place]- pt would prefer to go to neuro outpt location in [place]- call made to [facility]- and confirmed that they do have neuro rehab available. Referral form faxed to this CM - and will need MD signature - placed on shadow chart- once signed- can send back to the outpt center and pt can pt set up with appointments- fax # to send referral form is [phone]     Dawayne Patricia, RN 03/01/2017, 3:58 PM [phone]

## 2017-03-01 NOTE — Progress Notes (Signed)
  Progress Note    03/01/2017 7:17 AM 5 Days Post-Op  Subjective:  No complaints  Afebrile HR  60's-90's  450'T-888'K systolic  800% RA  Vitals:   02/28/17 1944 03/01/17 0421  BP: (!) 142/88 137/80  Pulse: 99 69  Resp: 18 18  Temp: 98.5 F (36.9 C) 98.2 F (36.8 C)    Physical Exam: Lungs:  Non labored Incisions:  Clean and dry Extremities:  5/5 all 4 extremities; mild right arm drift Neuro:  Moving all extremities equally; tongue is midline; mild right arm drift.   CBC    Component Value Date/Time   WBC 7.5 02/27/2017 0301   RBC 3.64 (L) 02/27/2017 0301   HGB 11.4 (L) 02/27/2017 0301   HCT 34.4 (L) 02/27/2017 0301   PLT 126 (L) 02/27/2017 0301   MCV 94.5 02/27/2017 0301   MCH 31.3 02/27/2017 0301   MCHC 33.1 02/27/2017 0301   RDW 12.1 02/27/2017 0301   LYMPHSABS 1.3 02/27/2017 0301   MONOABS 0.7 02/27/2017 0301   EOSABS 0.3 02/27/2017 0301   BASOSABS 0.0 02/27/2017 0301    BMET    Component Value Date/Time   NA 136 02/25/2017 0416   K 4.0 02/25/2017 0416   CL 102 02/25/2017 0416   CO2 23 02/25/2017 0416   GLUCOSE 110 (H) 02/25/2017 0416   BUN 6 02/25/2017 0416   CREATININE 0.72 02/25/2017 0416   CREATININE 0.98 02/22/2017 1047   CALCIUM 8.4 (L) 02/25/2017 0416   GFRNONAA >60 02/25/2017 0416   GFRNONAA 68 02/22/2017 1047   GFRAA >60 02/25/2017 0416   GFRAA 78 02/22/2017 1047    INR    Component Value Date/Time   INR 0.88 02/22/2017 2001     Intake/Output Summary (Last 24 hours) at 03/01/17 0717 Last data filed at 03/01/17 0500  Gross per 24 hour  Intake              840 ml  Output                0 ml  Net              840 ml   Surgical Pathology 02/24/17: 1. Lymph node, biopsy, Left Neck - BENIGN REACTIVE LYMPH NODE. - NO EVIDENCE OF MALIGNANCY. 2. Lymph node, biopsy, Left Neck #2 - BENIGN REACTIVE LYMPH NODE. - NO EVIDENCE OF MALIGNANCY. 3. Artery, biopsy, Left Carotid Intra-Arterial Mass - BLOOD CLOT. - NO EVIDENCE OF  MALIGNANCY. 4. Carotid artery, plaque, Left - ATHEROSCLEROTIC PLAQUE WITH HEMORRHAGE AND SLIGHT INFLAMMATION. - NO EVIDENCE OF MALIGNANCY.   Assessment:  51 y.o. female is s/p:  Left carotid endarterectomy  5 Days Post-Op  Plan: -pt doing well this morning. -per Neuro, continue aspirin and Plavix for 3 months and then aspirin alone after that.  -surgical pathology (Lymph nodes and plaque) all benign.  -hopefully to CIR today for further rehab.   Leontine Locket, PA-C [facility] [phone] 03/01/2017 7:17 AM

## 2017-03-01 NOTE — Progress Notes (Signed)
Rehab admissions - Please see rehab consult done by Dr. Naaman Plummer recommending [facility] therapies.  Expect patient will progress quickly.  Call me for questions.  #[phone]

## 2017-03-02 MED ORDER — ENSURE ENLIVE PO LIQD
237.0000 mL | Freq: Two times a day (BID) | ORAL | Status: DC
  Administered 2017-03-02: 237 mL via ORAL

## 2017-03-02 MED ORDER — ENSURE ENLIVE PO LIQD: 237.0000 mL | Freq: Two times a day (BID) | ORAL | 12 refills | Status: DC

## 2017-03-02 NOTE — Progress Notes (Signed)
Occupational Therapy Treatment Patient Details Name: Ashley Beard MRN: [id number] DOB: 1966/05/09 Today's Date: 03/02/2017    History of present illness Pt admitted for L CEA now with new L MCA and PCA territory infarcts. Pt with hx of TIA 2-3 weeks ago.    OT comments  Pt progressing however demonstrates significant deficits with RUE coordination, attention, sequencing, calculations and visual/visual perceptual skills. Pt states " I don't understand why I'm having such a hard time with things that should be so easy for me". Educated pt on strengths/deficits and need for continued OT. Also discussed need for pt to refrain from driving at this time. Pt verbalized understanding. Continue to recommend OT follow up at a neuro outpt center.   Follow Up Recommendations  Supervision/Assistance - 24 hour;Outpatient OT    Equipment Recommendations  None recommended by OT    Recommendations for Other Services      Precautions / Restrictions Precautions Precautions: Other (comment) Precaution Comments: R field cut and inattention/neglect       Mobility Bed Mobility Overal bed mobility: Modified Independent                Transfers Overall transfer level: Modified independent                    Balance Overall balance assessment: No apparent balance deficits (not formally assessed)                                         ADL either performed or assessed with clinical judgement   ADL                                       Functional mobility during ADLs: Modified independent General ADL Comments: Pt overall S with all basic ADL tasks     Vision   Additional Comments: Pt demonstrates unorganized scanning pattern. Mising itms in R upper quadrant. Pt also demonstrates difficulty with spatial accuracy. Pt is "off" when tying to mark an "x" on targets. "x" is consistently off center to L and displaced upward.  Accuracy is significantly  affected when her attention is challenged. Pt appears to have increased difficulty seeing items in upper R quadrant.  Recommend follow up visual field test with her eye MD prior to driving.    Perception  Apparent deficits with spatial relationships; R inattention  - drops items she is holding with R hand when distracted   Praxis  motor planning difficulties with RUE as tasks become more complex    Cognition Arousal/Alertness: Awake/alert Behavior During Therapy: WFL for tasks assessed/performed Overall Cognitive Status: Impaired/Different from baseline Area of Impairment: Attention;Memory;Problem solving                   Current Attention Level: Selective Memory: Decreased short-term memory       Problem Solving: Slow processing;Difficulty sequencing General Comments: Increased difficulty with calculations. unable to complete serial subtraction. Pt appeared frustrated with tasks that she said "should come so easily for her"        Exercises Other Exercises Other Exercises: fine motor/coordination Other Exercises: BUE integrated activities Other Exercises: visual scanning tasks Other Exercises: level 1 theraputty activities   Shoulder Instructions       General Comments      Pertinent Vitals/ Pain  Pain Assessment: No/denies pain  Home Living                                          Prior Functioning/Environment              Frequency  Min 3X/week        Progress Toward Goals  OT Goals(current goals can now be found in the care plan section)  Progress towards OT goals: Progressing toward goals  Acute Rehab OT Goals Patient Stated Goal: return to independence OT Goal Formulation: With patient Time For Goal Achievement: 03/12/17 Potential to Achieve Goals: Good ADL Goals Pt Will Perform Grooming: with modified independence;standing Pt Will Perform Upper Body Bathing: with modified independence;standing Pt Will Perform  Lower Body Bathing: with modified independence;sit to/from stand Pt Will Transfer to Toilet: with modified independence;ambulating Pt Will Perform Toileting - Clothing Manipulation and hygiene: with modified independence;sit to/from stand Pt Will Perform Tub/Shower Transfer: Shower transfer;ambulating;with modified independence Pt/caregiver will Perform Home Exercise Program: Right Upper extremity;Increased strength;Independently;With written HEP provided Additional ADL Goal #1: Pt will utilize compensatory strategies to locate visual targets/ ADL items in R hemispace.  Plan Discharge plan remains appropriate    Co-evaluation                 End of Session    OT Visit Diagnosis: Unsteadiness on feet (R26.81);Cognitive communication deficit (R41.841);Hemiplegia and hemiparesis Symptoms and signs involving cognitive functions: Cerebral infarction Hemiplegia - Right/Left: Right Hemiplegia - dominant/non-dominant: Dominant Hemiplegia - caused by: Cerebral infarction   Activity Tolerance Patient tolerated treatment well   Patient Left in bed;with call bell/phone within reach   Nurse Communication Mobility status        Time: 0488-8916 OT Time Calculation (min): 30 min  Charges: OT General Charges $OT Visit: 1 Procedure OT Treatments $Therapeutic Activity: 23-37 mins  [facility], OT/L  [phone] 03/02/2017   Ovila Lepage,HILLARY 03/02/2017, 8:58 AM

## 2017-03-02 NOTE — Progress Notes (Signed)
Patient ID: Ashley Beard, female   DOB: 06-14-1966, 51 y.o.   MRN: [id number] Comfortable this morning. No new neurologic changes. Ready for discharge Eating without difficulty and walking well. She was felt by rehabilitation services to have the progress past the need for inpatient rehabilitation. Have scheduled outpatient therapy. I will see her back in the office in 2 or 3 weeks for continued follow-up  Neck incision healing quite nicely with no wound issues

## 2017-03-02 NOTE — Care Management Note (Signed)
Case Management Note Previous CM note initiated by Zenon Mayo, RN--02/25/2017, 4:54 PM   Patient Details  Name: Ashley Beard MRN: [id number] Date of Birth: 1966-08-06  Subjective/Objective:  From home alone, pta indep, s/p CEA now with CVA, await pt/ot eval.  She has medication coverage , PCP and transportation at dc.  She has 2 daughters that live her in [place] and one sister that could assist her if needed.  NCM will cont to follow for dc needs.                   Action/Plan:   Expected Discharge Date:  03/02/17  03/02/17             Expected Discharge Plan:  Home/Self Care  In-House Referral:     Discharge planning Services  CM Consult  Post Acute Care Choice:    Choice offered to:  Patient  DME Arranged:    DME Agency:     HH Arranged:    [place] Agency:     Status of Service:  Completed, signed off  If discussed at [employer] of Stay Meetings, dates discussed:  3/27  Discharge Disposition: home/self care with outpt neuro rehab   Additional Comments:  03/02/17- 1000- Marvetta Gibbons RN, CM- faxed outpt neuro rehab form to [place] [facility]- call received from [place] that referral had been received and first appointment scheduled for tomorrow at 1:45- info given to pt and placed on AVS.   03/01/17- 1400- Lonny Eisen RN, CM- pt has progressed with therapies- and now recommendations for outpt neuro rehab- per Genie with CIR pt will not need IP rehab and they will not be offering a bed- pt can return home with family- have spoken with pt at bedside who states her mom will assist her at discharge- no DME needs noted or recommended- discussed with pt outpt neuro rehab options- as pt lives in [place]- pt would prefer to go to neuro outpt location in [place]- call made to [facility] [place] [facility]-[phone]) and confirmed that they do have neuro rehab available. Referral form faxed to this CM - and will need MD signature - placed on shadow chart-  once signed- can send back to the outpt center and pt can pt set up with appointments- fax # to send referral form is [phone]     Dawayne Patricia, RN 03/02/2017, 10:38 AM [phone]

## 2017-03-03 DIAGNOSIS — I6330 Cerebral infarction due to thrombosis of unspecified cerebral artery: Secondary | ICD-10-CM | POA: Diagnosis not present

## 2017-03-03 DIAGNOSIS — I69398 Other sequelae of cerebral infarction: Secondary | ICD-10-CM | POA: Diagnosis not present

## 2017-03-09 DIAGNOSIS — I69398 Other sequelae of cerebral infarction: Secondary | ICD-10-CM | POA: Diagnosis not present

## 2017-03-09 DIAGNOSIS — I6330 Cerebral infarction due to thrombosis of unspecified cerebral artery: Secondary | ICD-10-CM | POA: Diagnosis not present

## 2017-03-10 ENCOUNTER — Ambulatory Visit (INDEPENDENT_AMBULATORY_CARE_PROVIDER_SITE_OTHER): Payer: Self-pay | Admitting: Vascular Surgery

## 2017-03-10 ENCOUNTER — Encounter: Payer: Self-pay | Admitting: Vascular Surgery

## 2017-03-10 ENCOUNTER — Telehealth: Payer: Self-pay | Admitting: Vascular Surgery

## 2017-03-10 VITALS — BP 116/85 | HR 89 | Temp 97.5°F | Resp 16 | Ht 59.0 in | Wt 124.0 lb

## 2017-03-10 DIAGNOSIS — I6522 Occlusion and stenosis of left carotid artery: Secondary | ICD-10-CM

## 2017-03-10 NOTE — Telephone Encounter (Signed)
Looked like this patient needs a appointment with me

## 2017-03-10 NOTE — Telephone Encounter (Signed)
As per Dr. Luther Parody request, I contacted [facility] to get an appointment for this patient. I was sent to the New Patient Coordinator's voicemail. The patient has been referred to [facility] when she was inpatient but has not had an appointment yet. I mentioned in my voicemail that I will fax our referral to their office.

## 2017-03-10 NOTE — Progress Notes (Signed)
   Patient name: Ashley Beard MRN: [id number] DOB: [birth year] Sex: female  REASON FOR VISIT: I'll of recent carotid surgery  HPI: Ashley Beard is a 51 y.o. female presented today for follow-up. She had a very unusual presentation with difficulty and pain with swallowing. She underwent outpatient CT scan which did not show any cause for this. 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 She was admitted to the hospital and heparinized. She underwent 2-D echocardiogram showing no evidence of cardiac embolic source. She was taken to the operating room where she underwent exploration of the carotid artery and thrombectomy and endarterectomy. Unfortunately despite minimal manipulation of the carotid she did have embolus of this debris at the time of her surgery. She had a postoperative stroke resulting in some word searching a aphasia and right visual field neglect and also some clumsiness in her right hand. She was discharged in his had 2 sessions of physical therapy. She is in good spirits today. She feels that she is returning towards her baseline. She's had no new worsening of her neurologic deficits  Current Outpatient Prescriptions  Medication Sig Dispense Refill  . clopidogrel (PLAVIX) 75 MG tablet Take 1 tablet (75 mg total) by mouth daily. 30 tablet 2  . feeding supplement, ENSURE ENLIVE, (ENSURE ENLIVE) LIQD Take 237 mLs by mouth 2 (two) times daily between meals. 237 mL 12  . oxyCODONE-acetaminophen (PERCOCET/ROXICET) 5-325 MG tablet Take 1 tablet by mouth every 6 (six) hours as needed for moderate pain. 8 tablet 0   No current facility-administered medications for this visit.      PHYSICAL EXAM: Vitals:   03/10/17 1331 03/10/17 1336  BP: 115/90 116/85  Pulse: 89   Resp: 16   Temp: 97.5 F (36.4 C)   TempSrc: Oral   SpO2: 99%   Weight: 124 lb (56.2 kg)   Height: 4\' 11"  (1.499 m)     GENERAL: The patient is  a well-nourished female, in no acute distress. The vital signs are documented above. Left neck incision is healed quite nicely. She does have some mild clumsiness in her right hand but otherwise appears to be neurologically intact  MEDICAL ISSUES: Stable overall. We'll continue to work with physical therapy. She will see Dr.Sethi as planned in 2-3 weeks for continued stroke treatment. We'll see [place] again in 6 months with repeat carotid duplex. She'll notify should she develop any wound problems or neurologic changes   Rosetta Posner, MD [facility] Tel [phone] Pager [phone]

## 2017-03-10 NOTE — Telephone Encounter (Signed)
Ashley Beard from [facility] called back and stated that if the patient needs to be seen in the next 2-3 weeks, she would have to see another doctor. Dr. Leonie Man is booked for the next few weeks. I put in a referral for the patient to be seen sooner rather than later.

## 2017-03-11 NOTE — Addendum Note (Signed)
Addended by: Lianne Cure A on: 03/11/2017 09:35 AM   Modules accepted: Orders

## 2017-03-15 DIAGNOSIS — I6330 Cerebral infarction due to thrombosis of unspecified cerebral artery: Secondary | ICD-10-CM | POA: Diagnosis not present

## 2017-03-15 DIAGNOSIS — I69398 Other sequelae of cerebral infarction: Secondary | ICD-10-CM | POA: Diagnosis not present

## 2017-03-16 ENCOUNTER — Encounter: Payer: Self-pay | Admitting: Neurology

## 2017-03-16 ENCOUNTER — Ambulatory Visit (INDEPENDENT_AMBULATORY_CARE_PROVIDER_SITE_OTHER): Payer: BLUE CROSS/BLUE SHIELD | Admitting: Neurology

## 2017-03-16 DIAGNOSIS — H53461 Homonymous bilateral field defects, right side: Secondary | ICD-10-CM | POA: Diagnosis not present

## 2017-03-16 DIAGNOSIS — I639 Cerebral infarction, unspecified: Secondary | ICD-10-CM | POA: Insufficient documentation

## 2017-03-16 DIAGNOSIS — I63132 Cerebral infarction due to embolism of left carotid artery: Secondary | ICD-10-CM

## 2017-03-16 NOTE — Patient Instructions (Addendum)
I had a long d/w patient about her recent post carotid surgery stroke, right sided visual field loss,risk for recurrent stroke/TIAs, personally independently reviewed imaging studies and stroke evaluation results and answered questions.ContinuePlavix for secondary stroke prevention and maintain strict control of hypertension with blood pressure goal below 130/90, diabetes with hemoglobin A1c goal below 6.5% and lipids with LDL cholesterol goal below 70 mg/dL. check antiphospholipid antibody panel, lupus anticoagulant, Factor 8 assay and homocystine levels as patient had carotid bifurcation thrombus without significant risk factors for atherosclerosis at a young age. Check transesophageal echocardiogram, cardiac source of embolism I also advised the patient to eat a healthy diet with plenty of whole grains, cereals, fruits and vegetables, exercise regularly and maintain ideal body weight .I advised the patient not to drive or return to work until vision improves Followup in the future with me in  3 months or call earlier if necessary

## 2017-03-16 NOTE — Progress Notes (Signed)
[facility] [address] [place]. [place] [address] [phone]       OFFICE FOLLOW-UP NOTE  Ashley Beard Date of Birth:  02-02-1966 Medical Record Number:  [id number]   HPI:  [name]  Is a pleasant 51 year lady seen today for first office follow-up visit following hospital admission for carotid surgery and stroke in March 2018. History is obtained from the patient, her husband as well as review of electronic medical records and have personally reviewed imaging films.Ashley Beard is a 51 y.o. 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 She is otherwise healthy 51 year old female. She has had a several month history of discomfort and a burning sensation with swallowing. Also had was felt to be a sinus infection and was treated for this. This is failed to resolve and she saw her primary care physician today for continued evaluation. Patient underwent CT scan as an outpatient for this. The scan did not show any cause for the swallowing difficulty but did show a finding of a large clot located in her left carotid bifurcation with extension into her left external carotid artery.Dr Donnetta Hutching was consulted and suggested the patient be sent to [facility] for admission anticoagulation and further workup and surgery. . She did not have any history of other clotting disorders. Her only past medical history was significant for anxiety.   On further questioning regarding any neurologic deficits, he does relate a event approximately 2-3 weeks ago where she had limp and numbness in her right arm. This lasted for several minutes and then not completely resolved. Sheidid not have any speech difficulties and no right leg difficulty. She was been completely neurologically intact otherwise. She has no history of cardiac irregularity. There is no history of deep vein thrombosis or pulmonary embolism. Patient had elective  left carotid endarterectomy on 02/24/17 which was uneventful  and showed large clot in the left common carotid artery without significant underlying plaque or stenosis but postprocedure was noted to have aphasia as well as right-sided vision defect and right hemi-neglect. CT scan of the head up in the next day showed subacute left hemispheric infarcts. She underwent emergent cerebral catheter angiogram performed by Dr. Estanislado Pandy which showed approximately 50% stenosis at the distal aspect of the left carotid endarterectomy suture site with 2-3 small filling defects probably representing clots. There was no intracranial stenosis or occlusion noted. MRI scan of the brain showed patchy left frontal and parietal as well as left occipital embolic infarcts with small petechial hemorrhage. 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 Carotid ultrasound showed no significant right ICA and 80-99% left ICA stenosis. However catheter angiogram showed only 50% stenosis. Transthoracic echo showed 60-65% cardiac ejection fraction. Area cholesterol was 62 mg percent. Hemoglobin A1c was 4.9. Hypercarbia panel labs were not sent. Patient was initially on heparin and subsequently switched to aspirin and Plavix. She states she's done well since discharge her speech is improved though she still has some word finding difficulties and speech has patient. Right-sided vision loss is also improving but she still has persistent deficits. She has mild dull headaches which are constant and occasionally sharp but not disabling. She also has a toothache and has not been taking any medications for this. Patient is currently participating in outpatient speech therapy in [facility]. She complains of mild short-term memory and cognitive difficulties but is able to manage  her own affairs. She has seen Dr. Donnetta Hutching and plans to follow-up carotid ultrasound done in 6 months. She is tolerating   Plavix without bruising or bleeding. Her  blood pressure is fine today and 119/84 ROS:   14 system review of systems is positive for  double vision, blurred vision, food allergies, headache, speech difficulty and all other systems negative  PMH:  Past Medical History:  Diagnosis Date  . Abnormal Pap smear of cervix    age 51  . Anxiety   . Carotid arterial disease ([place])   . Headache   . Refusal of blood transfusions as patient is Jehovah's Witness   . Stroke [facility])     Social History:  Social History   Social History  . Marital status: Single    Spouse name: N/A  . Number of children: N/A  . Years of education: N/A   Occupational History  . Not on file.   Social History Main Topics  . Smoking status: Former Smoker    Types: Cigarettes    Quit date: 09/2016  . Smokeless tobacco: Never Used     Comment: very rare, social  . Alcohol use 1.2 oz/week    2 Cans of beer per week     Comment: social  . Drug use: No  . Sexual activity: Yes    Birth control/ protection: Condom   Other Topics Concern  . Not on file   Social History Narrative  . No narrative on file    Medications:   Current Outpatient Prescriptions on File Prior to Visit  Medication Sig Dispense Refill  . clopidogrel (PLAVIX) 75 MG tablet Take 1 tablet (75 mg total) by mouth daily. 30 tablet 2   No current facility-administered medications on file prior to visit.     Allergies:   Allergies  Allergen Reactions  . Penicillins Hives and Other (See Comments)    Nightmares Has patient had a PCN reaction causing immediate rash, facial/tongue/throat swelling, SOB or lightheadedness with hypotension: Yes Has patient had a PCN reaction causing severe rash involving mucus membranes or skin necrosis: No Has patient had a PCN reaction that required hospitalization pt was in the hospital at time of reaction Has patient had a PCN reaction occurring within the last 10 years: No If all of the above answers are "NO", then may proceed with Cephalosporin  use.  . Fish Allergy Itching and Swelling    Lymph node swelling  . Other Itching and Swelling    Reaction to blue cheese Lymph node swelling  . Shrimp [Shellfish Allergy] Itching and Swelling    Lymph node swelling  . Yeast-Related Products Itching and Swelling    Lymph node swelling    Physical Exam General: Frail middle-aged lady seated, in no evident distress Head: head normocephalic and atraumatic.  Neck: supple with no carotid or supraclavicular bruits Cardiovascular: regular rate and rhythm, no murmurs Musculoskeletal: no deformity Skin:  no rash/petichiae. Left CEA scar in the neck Vascular:  Normal pulses all extremities Vitals:   03/16/17 1318  BP: 119/84  Pulse: 85   Neurologic Exam Mental Status: Awake and fully alert. Oriented to place and time. Recent and remote memory intact. Attention span, concentration and fund of knowledge appropriate. Mood and affect appropriate. Speech is slightly hesitant but no significant aphasia. Good naming comprehension and a petition. Able to name 10 animals with 4 legs Cranial Nerves: Fundoscopic exam reveals sharp disc margins. Pupils equal, briskly reactive to light. Extraocular movements full without  nystagmus. Visual fields  show partial right homonymous hemianopsia to confrontation. Hearing intact. Facial sensation intact. Face, tongue, palate moves normally and symmetrically.  Motor: Normal bulk and tone. Normal strength in all tested extremity muscles. Sensory.: intact to touch ,pinprick .position and vibratory sensation.  Coordination: Rapid alternating movements normal in all extremities. Finger-to-nose and heel-to-shin performed accurately bilaterally. Gait and Station: Arises from chair without difficulty. Stance is normal. Gait demonstrates normal stride length and balance . Able to heel, toe and tandem walk without difficulty.  Reflexes: 1+ and symmetric. Toes downgoing.   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 check antiphospholipid antibody panel, lupus anticoagulant, Factor 8 assay and homocystine levels as patient had carotid bifurcation thrombus without significant risk factors for atherosclerosis at a young age. Check transesophageal echocardiogram, cardiac source of embolism I also advised the patient to eat a healthy diet with plenty of whole grains, cereals, fruits and vegetables, exercise regularly and maintain ideal body weight .I advised the patient not to drive or return to work until vision improves Followup in the future with me in  3 months or call earlier if necessary Greater than 50% of time during this 30 minute visit was spent on counseling,explanation of diagnosis, planning of further management, discussion with patient and family and coordination of care Antony Contras, MD  [facility] [address] [place], [place] [address]  Phone [phone] Fax [phone] Note: This document was prepared with digital dictation and possible smart phrase technology. Any transcriptional errors that result from this process are unintentional

## 2017-03-18 ENCOUNTER — Telehealth: Payer: Self-pay | Admitting: Neurology

## 2017-03-18 LAB — LUPUS ANTICOAGULANT
DPT CONFIRM RATIO: 1.3 ratio (ref 0.00–1.40)
Dilute Viper Venom Time: 37.3 s (ref 0.0–47.0)
PTT LA: 33.2 s (ref 0.0–51.9)
THROMBIN TIME: 21 s (ref 0.0–23.0)
dPT: 44.8 s (ref 0.0–55.0)

## 2017-03-18 LAB — HOMOCYSTEINE: Homocysteine: 12.5 umol/L (ref 0.0–15.0)

## 2017-03-18 LAB — ANTIPHOSPHOLIPID SYNDROME EVAL, BLD
APTT PPP: 26.2 s (ref 22.9–30.2)
Anticardiolipin IgG: <9 GPL U/mL (ref 0–14)
Beta-2 Glyco I IgG: <9 GPI IgG units (ref 0–20)
HEXAGONAL PHASE PHOSPHOLIPID: 0 s (ref 0–11)
INR: 0.9 (ref 0.9–1.1)
PT: 10 s (ref 9.6–11.5)

## 2017-03-18 LAB — COAG STUDIES INTERP REPORT

## 2017-03-18 NOTE — Telephone Encounter (Signed)
FM:A papers put on Dr. Leonie Man desk. Dr. Leonie Man will approve if form can be filled out.

## 2017-03-18 NOTE — Telephone Encounter (Signed)
Rn call patient back about her FMLA form from [place]. Rn stated the form has not been receive by the officde.

## 2017-03-18 NOTE — Telephone Encounter (Signed)
Pt is asking for a call about a request for FMLA from a company called [employer], she said she was told that they faxed over paperwork to Dr Leonie Man on yesterday and wants to know if it has been received

## 2017-03-19 NOTE — Telephone Encounter (Signed)
Left vm for patient that FMLA form can be filled out per Dr. Leonie Man Pt is out of work for 3 months,and is to not drive because of vision per Dr. Leonie Man note. Rn left message that the FMLA is only for our office. Pt already paid fee of 50.00 dollars. The FMLA is for patients mom to accompany her to [place] appts and testing.

## 2017-03-19 NOTE — Telephone Encounter (Signed)
Rn receive incoming call from patient concerning the FMLA form for her daughter. PT stated the form is for her daughters job. Her daughter will be transporting her to therapy, pharmacy,and [facility] visits, and testing. Rn stated she will need to filled out a release form to release her medical condition to [facility]. Rn requested she call on Monday or today before 1200pm to provide [place] with the [employer] fax number, and one for her daughter fax to send the release form. Pt stated she has been out of work for the month of April. Rn stated she was first seen by Dr. Leonie Man on 03/15/2017 for office visit. Rn suggested she seek her other MD who she saw between 03/07/2017 and 4/8;/2018 to get a letter about out of work.  Rn stated Dr.Sethi has approve her out of work from 03/15/2017 to 06/14/2017 for her vision and till it improves. Pt will need to sign a release form,and turn it back in. Pt verbalized understanding.

## 2017-03-22 DIAGNOSIS — I6330 Cerebral infarction due to thrombosis of unspecified cerebral artery: Secondary | ICD-10-CM | POA: Diagnosis not present

## 2017-03-22 DIAGNOSIS — I69398 Other sequelae of cerebral infarction: Secondary | ICD-10-CM | POA: Diagnosis not present

## 2017-03-24 NOTE — Telephone Encounter (Signed)
Pt returned Rn's call. The fax number for Teena Dunk is [phone]. She is wanting this faxed asap, said she is suppose to get paid this Friday. Please call to let her know when it has been faxed

## 2017-03-24 NOTE — Telephone Encounter (Signed)
Rn return patients daughter [name] phone call. [name] stated pts disability forms were filled out by Dr. Sherren Mocha Early her vascular surgeon. Dr Donnetta Hutching filed the form out and wrote down return to work 03/11/2017. Rn stated patient saw Dr. Leonie Man 03/16/2017 and he recommend pt be out of work for 3 months, and not to drive for 3 months. [name] stated her mom is still driving. Rn stated pt and her friend were told she is to not drive for 3 months starting as of 03/18/2017 by Dr. Leonie Man at the appt. Rn also stated it was written on her office notes. [name] state her mom is still driving,and she will discuss that with her mom. Rn stated due to Dr. Leonie Man office assessment,and pts vision he recommend she don't drive and work for 3 months. Rn stated her mom needs to filled out a release form for the FMLA, and disability form. Rn stated pt already paid for FMLA form. Rn stated Dr. Leonie Man can waive the fee for the second form. Rn stated if pt cannot return to work in July and needs another form filled out, she will have to pay another fee. Daughter will have he moms job fax disability form to [phone]. Pts daughter verbalized understanding.

## 2017-03-24 NOTE — Telephone Encounter (Signed)
Pt  And Pt daughter called re: FMLA paperwork, the daughter is asking to be called([phone]) so that she can conference in pt to have all questions answered re: paperwork.

## 2017-03-24 NOTE — Telephone Encounter (Addendum)
Rn stated the only form we filled out for her was a FMLA form for her daughter Charlesetta Shanks. Rn stated Dr.Sethi did not fill a disability form out for her. Rn stated she paid for the mla form to get filled out. Rn stated a release form is needed for her to be attach to the [facility] form. Rn recommend pt call the doctor  who filled out her original disability form. Pt verbalized understanding.

## 2017-03-24 NOTE — Telephone Encounter (Signed)
Patient will be coming to office to sign a release form the Flm[employer] form.

## 2017-03-25 NOTE — Telephone Encounter (Signed)
Disability form receive. Form ready for Dr. Leonie Man to fill out and sign.

## 2017-03-27 ENCOUNTER — Emergency Department
Admission: EM | Admit: 2017-03-27 | Discharge: 2017-03-27 | Disposition: A | Discharge status: Home / Self Care | Payer: BLUE CROSS/BLUE SHIELD | Source: Home / Self Care | Attending: Family Medicine | Admitting: Family Medicine

## 2017-03-27 ENCOUNTER — Encounter: Payer: Self-pay | Admitting: Emergency Medicine

## 2017-03-27 DIAGNOSIS — K0889 Other specified disorders of teeth and supporting structures: Secondary | ICD-10-CM | POA: Diagnosis not present

## 2017-03-27 MED ORDER — CLINDAMYCIN HCL 300 MG PO CAPS: 300.0000 mg | ORAL_CAPSULE | Freq: Three times a day (TID) | ORAL | 0 refills | Status: DC

## 2017-03-27 MED ORDER — HYDROCODONE-ACETAMINOPHEN 5-325 MG PO TABS: 1.0000 | ORAL_TABLET | Freq: Four times a day (QID) | ORAL | 0 refills | Status: DC | PRN

## 2017-03-27 NOTE — ED Triage Notes (Addendum)
Patient presents to [facility] with left sided facial pain, patient reports hx of stroke 1 month ago. Patient states that she has a shooting pain from Jaw to Head every hour that is a 10/10.

## 2017-03-27 NOTE — Discharge Instructions (Signed)
If symptoms become significantly worse during the night or over the weekend, proceed to the local emergency room.  Try to limit pain medication for bedtime (may take Tylenol daytime for pain).

## 2017-03-27 NOTE — ED Provider Notes (Signed)
[facility]    CSN: [id number] Arrival date & time: 03/27/17  0905     History   Chief Complaint Chief Complaint  Patient presents with  . Facial Pain    HPI Ashley Beard is a 51 y.o. female.   At about 9pm yesterday patient developed sharp lancinating pain in her left face and jaw radiating to her left parietal area.  She has had a painful tooth in her left mandible for about 3 months, now acutely worse, and worse when supine.  No fevers, chills, and sweats.  Patient is s/p left carotid surgery and stroke in March 2018.  She reports no new or changing neurologic symptoms.   The history is provided by the patient.    Past Medical History:  Diagnosis Date  . Abnormal Pap smear of cervix    age 64  . Anxiety   . Carotid arterial disease ([place])   . Headache   . Refusal of blood transfusions as patient is Jehovah's Witness   . Stroke [facility])     Patient Active Problem List   Diagnosis Date Noted  . Embolic stroke involving carotid artery ([place]) 03/16/2017  . Right homonymous hemianopsia 03/16/2017  . Cerebral thrombosis with cerebral infarction 02/26/2017  . Odynophagia 02/22/2017  . Lymphadenopathy of head and neck 02/22/2017  . Elevated blood pressure reading 02/22/2017  . Carotid artery disease ([place]) 02/22/2017    Past Surgical History:  Procedure Laterality Date  . ENDARTERECTOMY Left 02/24/2017   Procedure: ENDARTERECTOMY CAROTID with removal of thrombus ;  Surgeon: Ashley Posner, Ashley;  Location: [facility];  Service: Vascular;  Laterality: Left;  . GYNECOLOGIC CRYOSURGERY     age 68  . IR GENERIC HISTORICAL  02/25/2017   IR ANGIO VERTEBRAL SEL SUBCLAVIAN INNOMINATE UNI R MOD SED 02/25/2017 Ashley Beard, Ashley Beard  . IR GENERIC HISTORICAL  02/25/2017   IR ANGIO INTRA EXTRACRAN SEL COM CAROTID INNOMINATE BILAT MOD SED 02/25/2017 Ashley Beard, Ashley Beard  . OOPHORECTOMY      OB History    Obstetric Comments   N/A       Home  Medications    Prior to Admission medications   Medication Sig Start Date End Date Taking? Authorizing Provider  clindamycin (CLEOCIN) 300 MG capsule Take 1 capsule (300 mg total) by mouth 3 (three) times daily. 03/27/17   Ashley Beard  clopidogrel (PLAVIX) 75 MG tablet Take 1 tablet (75 mg total) by mouth daily. 03/01/17   Ashley J Rhyne, Beard  HYDROcodone-acetaminophen (NORCO/VICODIN) 5-325 MG tablet Take 1 tablet by mouth every 6 (six) hours as needed for moderate pain. 03/27/17   Ashley Beard    Family History Family History  Problem Relation Age of Onset  . Hypertension Other   . Diabetes Other   . Diabetes Maternal Grandmother   . Hypertension Maternal Grandmother   . Stroke Paternal Grandfather     Social History Social History  Substance Use Topics  . Smoking status: Former Smoker    Types: Cigarettes    Quit date: 09/2016  . Smokeless tobacco: Never Used     Comment: very rare, social  . Alcohol use 1.2 oz/week    2 Cans of beer per week     Comment: social     Allergies   Penicillins; Fish allergy; Other; Shrimp [shellfish allergy]; and Yeast-related products   Review of Systems Review of Systems  Constitutional: Negative for appetite change, chills, diaphoresis, fatigue and  fever.  HENT: Positive for dental problem. Negative for congestion, drooling, ear pain, facial swelling, sinus pain, sinus pressure, sore throat and trouble swallowing.   Eyes: Negative.   Respiratory: Negative.   Cardiovascular: Negative.   Gastrointestinal: Negative.   Genitourinary: Negative.   Musculoskeletal: Negative.   Skin: Negative.   Neurological: Positive for headaches. Negative for dizziness, seizures, syncope, facial asymmetry, speech difficulty, light-headedness and numbness.     Physical Exam Triage Vital Signs ED Triage Vitals  Enc Vitals Group     BP 03/27/17 0931 (!) 148/91     Pulse Rate 03/27/17 0931 75     Resp --      Temp 03/27/17 0931 97.5  F (36.4 C)     Temp Source 03/27/17 0931 Oral     SpO2 03/27/17 0931 98 %     Weight --      Height --      Head Circumference --      Peak Flow --      Pain Score 03/27/17 0933 10     Pain Loc --      Pain Edu? --      Excl. in [place]? --    No data found.   Updated Vital Signs BP (!) 148/91 (BP Location: Left Arm)   Pulse 75   Temp 97.5 F (36.4 C) (Oral)   LMP 02/20/2017   SpO2 98%   Visual Acuity Right Eye Distance:   Left Eye Distance:   Bilateral Distance:    Right Eye Near:   Left Eye Near:    Bilateral Near:     Physical Exam  Constitutional: She is oriented to person, place, and time. She appears well-developed and well-nourished. No distress.  HENT:  Head: Normocephalic.  Right Ear: Tympanic membrane, external ear and ear canal normal.  Left Ear: Tympanic membrane, external ear and ear canal normal.  Nose: Nose normal.  Mouth/Throat: Oropharynx is clear and moist and mucous membranes are normal. Dental caries present.    Multiple caries present. Tooth #19 left mandible is cracked, with tenderness to tap and tenderness over gingiva.  No fluctuance. No facial swelling present.  Eyes: Conjunctivae and EOM are normal. Pupils are equal, round, and reactive to light.  Neck: Normal range of motion. Neck supple.  Well healed surgical scar left lateral neck.  Cardiovascular: Normal heart sounds.   Pulmonary/Chest: Breath sounds normal.  Abdominal: There is no tenderness.  Musculoskeletal: She exhibits no edema.  Lymphadenopathy:    She has no cervical adenopathy.  Neurological: She is alert and oriented to person, place, and time. She has normal strength. She displays normal reflexes. No cranial nerve deficit or sensory deficit. Coordination normal.  Skin: Skin is warm and dry.  Nursing note and vitals reviewed.    UC Treatments / Results  Labs (all labs ordered are listed, but only abnormal results are displayed) Labs Reviewed - No data to  display  EKG  EKG Interpretation None       Radiology No results found.  Procedures Procedures (including critical care time)  Medications Ordered in UC Medications - No data to display   Initial Impression / Assessment and Plan / UC Course  I have reviewed the triage vital signs and the nursing notes.  Pertinent labs & imaging results that were available during my care of the patient were reviewed by me and considered in my medical decision making (see chart for details).    Normal neurologic exam reassuring.  Patient's  facial pain and headache appear to be resulting from dental pain. Begin Clindamycin.  Rx for Lortab. If symptoms become significantly worse during the night or over the weekend, proceed to the local emergency room.  Try to limit pain medication for bedtime (may take Tylenol daytime for pain). Followup with dentist as soon as possible.     Final Clinical Impressions(s) / UC Diagnoses   Final diagnoses:  Pain, dental    New Prescriptions New Prescriptions   CLINDAMYCIN (CLEOCIN) 300 MG CAPSULE    Take 1 capsule (300 mg total) by mouth 3 (three) times daily.   HYDROCODONE-ACETAMINOPHEN (NORCO/VICODIN) 5-325 MG TABLET    Take 1 tablet by mouth every 6 (six) hours as needed for moderate pain.     Ashley Beard 04/08/17 [id number]

## 2017-03-29 ENCOUNTER — Encounter (HOSPITAL_COMMUNITY): Payer: Self-pay

## 2017-03-29 ENCOUNTER — Emergency Department (HOSPITAL_COMMUNITY)
Admission: EM | Admit: 2017-03-29 | Discharge: 2017-03-29 | Disposition: A | Discharge status: Home / Self Care | Payer: BLUE CROSS/BLUE SHIELD | Attending: Emergency Medicine | Admitting: Emergency Medicine

## 2017-03-29 ENCOUNTER — Telehealth: Payer: Self-pay | Admitting: *Deleted

## 2017-03-29 ENCOUNTER — Telehealth: Payer: Self-pay | Admitting: Neurology

## 2017-03-29 DIAGNOSIS — R51 Headache: Secondary | ICD-10-CM | POA: Insufficient documentation

## 2017-03-29 DIAGNOSIS — Z87891 Personal history of nicotine dependence: Secondary | ICD-10-CM | POA: Diagnosis not present

## 2017-03-29 DIAGNOSIS — Z8673 Personal history of transient ischemic attack (TIA), and cerebral infarction without residual deficits: Secondary | ICD-10-CM | POA: Insufficient documentation

## 2017-03-29 DIAGNOSIS — R519 Headache, unspecified: Secondary | ICD-10-CM

## 2017-03-29 MED ORDER — IBUPROFEN 400 MG PO TABS
600.0000 mg | ORAL_TABLET | Freq: Once | ORAL | Status: AC
  Administered 2017-03-29: 600 mg via ORAL
  Filled 2017-03-29: qty 1

## 2017-03-29 MED ORDER — OXYCODONE-ACETAMINOPHEN 5-325 MG PO TABS
2.0000 | ORAL_TABLET | Freq: Once | ORAL | Status: AC
  Administered 2017-03-29: 2 via ORAL
  Filled 2017-03-29: qty 2

## 2017-03-29 MED ORDER — METOCLOPRAMIDE HCL 10 MG PO TABS
10.0000 mg | ORAL_TABLET | Freq: Once | ORAL | Status: AC
  Administered 2017-03-29: 10 mg via ORAL
  Filled 2017-03-29: qty 1

## 2017-03-29 MED ORDER — DIPHENHYDRAMINE HCL 25 MG PO CAPS
50.0000 mg | ORAL_CAPSULE | Freq: Once | ORAL | Status: AC
  Administered 2017-03-29: 50 mg via ORAL
  Filled 2017-03-29: qty 2

## 2017-03-29 MED ORDER — PROCHLORPERAZINE EDISYLATE 5 MG/ML IJ SOLN
10.0000 mg | Freq: Once | INTRAMUSCULAR | Status: AC
  Administered 2017-03-29: 10 mg via INTRAMUSCULAR
  Filled 2017-03-29: qty 2

## 2017-03-29 MED ORDER — HYDROMORPHONE HCL 1 MG/ML IJ SOLN
2.0000 mg | Freq: Once | INTRAMUSCULAR | Status: AC
  Administered 2017-03-29: 2 mg via INTRAMUSCULAR
  Filled 2017-03-29: qty 2

## 2017-03-29 NOTE — Telephone Encounter (Signed)
Rn receive message that patient made appt for 03/30/2017 at 0400pm for ED follow up for headache. Pt was in the ED on 03/29/2017. PEr Dr. Fransico Meadow he does not prescribed pain medicine for headaches. Pt will be evaluated for a headache.

## 2017-03-29 NOTE — Telephone Encounter (Signed)
Agree with plan 

## 2017-03-29 NOTE — ED Notes (Signed)
Pt states she feels the headache is related to the teeth she is due to have removed soon.

## 2017-03-29 NOTE — Telephone Encounter (Signed)
Patient was seen this month by Dr.Sethi. Pt was in the ED for headache today. Dr. Leonie Man please advise. PTs fiancee refuse to schedule appt on 03/31/2017.Celesta Gentile is not on dpr to speak with. Rn will call patients daughter about her moms headache or patient.

## 2017-03-29 NOTE — ED Triage Notes (Signed)
Pt states she started having HA x 2 days at 10/10; pt states recent surgery on major artery on left side of neck; pt neuro in tact and speaking in full complete sentences

## 2017-03-29 NOTE — ED Provider Notes (Signed)
[place] DEPT Provider Note   CSN: [id number] Arrival date & time: 03/29/17  0158  By signing my name below, I, Ashley Beard, attest that this documentation has been prepared under the direction and in the presence of Everlene Balls, MD . Electronically Signed: Higinio Beard, Scribe. 03/29/2017. 2:53 AM.  History   Chief Complaint Chief Complaint  Patient presents with  . Headache   The history is provided by the patient. No language interpreter was used.   HPI Comments: Ashley Beard is a 51 y.o. female with PMHx of CAD and stroke on Plavix, who presents to the Emergency Department complaining of intermittent, sharp, left-sided frontal headache that began ~2 days ago. Pt reports her headache is exacerbated when lying down in a supine position. She states hx of a recent TIA affecting her right side on 02/22/17 and PSHx of endarterectomy carotid with removal of the thrombus on 02/24/17. She notes she began to experience recurrent, intermittent, frontal headaches since this surgery and prescribed "a headache" medication by her neurologist with moderate relief. However, she states she has now run out of this medication. Patient and family do not remember the name.  Pt reports associated left lower leg swelling and bruising that also began soon after her surgery. She denies any rhinorrhea, sneezing, cough, nausea, or vomiting.   Past Medical History:  Diagnosis Date  . Abnormal Pap smear of cervix    age 89  . Anxiety   . Carotid arterial disease ([place])   . Headache   . Refusal of blood transfusions as patient is Jehovah's Witness   . Stroke [facility])     Patient Active Problem List   Diagnosis Date Noted  . Embolic stroke involving carotid artery ([place]) 03/16/2017  . Right homonymous hemianopsia 03/16/2017  . Cerebral thrombosis with cerebral infarction 02/26/2017  . Odynophagia 02/22/2017  . Lymphadenopathy of head and neck 02/22/2017  . Elevated blood pressure reading 02/22/2017  . Carotid  artery disease ([employer]) 02/22/2017    Past Surgical History:  Procedure Laterality Date  . ENDARTERECTOMY Left 02/24/2017   Procedure: ENDARTERECTOMY CAROTID with removal of thrombus ;  Surgeon: Rosetta Posner, MD;  Location: [facility];  Service: Vascular;  Laterality: Left;  . GYNECOLOGIC CRYOSURGERY     age 57  . IR GENERIC HISTORICAL  02/25/2017   IR ANGIO VERTEBRAL SEL SUBCLAVIAN INNOMINATE UNI R MOD SED 02/25/2017 [name], MD [facility]-INTER[facility]  . IR GENERIC HISTORICAL  02/25/2017   IR ANGIO INTRA EXTRACRAN SEL COM CAROTID INNOMINATE BILAT MOD SED 02/25/2017 [name], MD [facility]  . OOPHORECTOMY      OB History    Obstetric Comments   N/A     Home Medications    Prior to Admission medications   Medication Sig Start Date End Date Taking? Authorizing Provider  clindamycin (CLEOCIN) 300 MG capsule Take 1 capsule (300 mg total) by mouth 3 (three) times daily. 03/27/17   Kandra Nicolas, MD  clopidogrel (PLAVIX) 75 MG tablet Take 1 tablet (75 mg total) by mouth daily. 03/01/17   Samantha J Rhyne, PA-C  HYDROcodone-acetaminophen (NORCO/VICODIN) 5-325 MG tablet Take 1 tablet by mouth every 6 (six) hours as needed for moderate pain. 03/27/17   Kandra Nicolas, MD    Family History Family History  Problem Relation Age of Onset  . Hypertension Other   . Diabetes Other   . Diabetes Maternal Grandmother   . Hypertension Maternal Grandmother   . Stroke Paternal Grandfather    Social  History Social History  Substance Use Topics  . Smoking status: Former Smoker    Types: Cigarettes    Quit date: 09/2016  . Smokeless tobacco: Never Used     Comment: very rare, social  . Alcohol use 1.2 oz/week    2 Cans of beer per week     Comment: social   Allergies   Penicillins; Fish allergy; Other; Shrimp [shellfish allergy]; and Yeast-related products  Review of Systems Review of Systems  HENT: Negative for rhinorrhea and sneezing.   Respiratory: Negative for cough.     Cardiovascular: Positive for leg swelling.  Gastrointestinal: Negative for nausea and vomiting.  Neurological: Positive for headaches.  All other systems reviewed and are negative.  Physical Exam Updated Vital Signs BP (!) 134/91 (BP Location: Left Arm)   Pulse 74   Temp 98 F (36.7 C) (Oral)   Resp 18   LMP 03/02/2017   SpO2 100%   Physical Exam  Constitutional: She is oriented to person, place, and time. She appears well-developed and well-nourished. No distress.  HENT:  Head: Normocephalic and atraumatic.  Nose: Nose normal.  Mouth/Throat: Oropharynx is clear and moist. No oropharyngeal exudate.  Multiple dental caries but no abscess formation.   Eyes: Conjunctivae and EOM are normal. Pupils are equal, round, and reactive to light. No scleral icterus.  Neck: Normal range of motion. Neck supple. No JVD present. No tracheal deviation present. No thyromegaly present.  Cardiovascular: Normal rate, regular rhythm and normal heart sounds.  Exam reveals no gallop and no friction rub.   No murmur heard. Pulmonary/Chest: Effort normal and breath sounds normal. No respiratory distress. She has no wheezes. She exhibits no tenderness.  Abdominal: Soft. Bowel sounds are normal. She exhibits no distension and no mass. There is no tenderness. There is no rebound and no guarding.  Musculoskeletal: Normal range of motion. She exhibits no edema or tenderness.  Lymphadenopathy:    She has no cervical adenopathy.  Neurological: She is alert and oriented to person, place, and time. No cranial nerve deficit. She exhibits normal muscle tone.  Normal strength and sensation in all extremities. Normal cerebellar testing.   Skin: Skin is warm and dry. No rash noted. No erythema. No pallor.  Nursing note and vitals reviewed.  ED Treatments / Results  DIAGNOSTIC STUDIES:  Oxygen Saturation is 100% on RA, normal by my interpretation.    COORDINATION OF CARE:  2:38 AM Discussed treatment Beard with  pt at bedside and pt agreed to Beard.  Labs (all labs ordered are listed, but only abnormal results are displayed) Labs Reviewed - No data to display  EKG  EKG Interpretation None       Radiology No results found.  Procedures Procedures (including critical care time)  Medications Ordered in ED Medications - No data to display  Initial Impression / Assessment and Beard / ED Course  I have reviewed the triage vital signs and the nursing notes.  Pertinent labs & imaging results that were available during my care of the patient were reviewed by me and considered in my medical decision making (see chart for details).     Patient presents to the ED for headache. She is unsure if it is related to recent stroke or need for dental work.  It certainly could be either. She was treated with ibuprofen reglan, and benadryl.  I can not find the name of the headache medication she states worked well for her in chart review.  Her neurological exam  here is normal aside from some very mild speech delay.  Will continue to monitor in the ED.   4:28 AM Patient still had a headache and was given percocet and compazine which also did not work. Her neuro exam remains normal. I do not believe she needs repeat CT scan.  She is requesting to go home and call her neurologist for further care.  Will give IM dilaudid prior to DC to help with pain and sleep.      I personally performed the services described in this documentation, which was scribed in my presence. The recorded information has been reviewed and is accurate.     Final Clinical Impressions(s) / ED Diagnoses   Final diagnoses:  None    New Prescriptions New Prescriptions   No medications on file     Everlene Balls, MD 03/29/17 [phone]

## 2017-03-29 NOTE — Telephone Encounter (Signed)
Pt [employer] form on [place] desk.

## 2017-03-29 NOTE — Telephone Encounter (Signed)
[employer] form fax already and confirmed.

## 2017-03-29 NOTE — Telephone Encounter (Signed)
Fritz Pickerel Pearson/[phone] (fiancee) called said she was seen in ED today and advised to see Dr Leonie Man asap for HA's. ED would not give her any medications he advised. An appt was offered on 4/25 but he is wanting her seen today. Please call asap.

## 2017-03-29 NOTE — Telephone Encounter (Signed)
Per phone staff pts daughter cancel appt with Dr. Leonie Man tomorrow. Pts daughter stated her mom will go to her PCP about pain medicine.

## 2017-03-29 NOTE — Telephone Encounter (Signed)
Rn call patients daughter [name] about her fiancee calling our office. Pt was seen in the ED for  headacheRn explain to doctor the only people listed on the dpr are the patient and her two daughters. Rn stated the pt told ED doctors that our office prescribed her headache medicine. Rn stated Dr. Leonie Man never treated her for headaches. Rn also explain to daughter phone staff offer appt to patients fiancee on 03/31/2017 but he declined. Rn stated pts have schedule appts at our office. [name] will call her mom and go by the house to speak with her about this.Marland Kitchen

## 2017-03-29 NOTE — Telephone Encounter (Signed)
[employer] disability form fax to [phone].Forms fax twice and confirmed twice.

## 2017-03-30 ENCOUNTER — Ambulatory Visit (INDEPENDENT_AMBULATORY_CARE_PROVIDER_SITE_OTHER): Payer: BLUE CROSS/BLUE SHIELD | Admitting: Physician Assistant

## 2017-03-30 ENCOUNTER — Ambulatory Visit: Payer: BLUE CROSS/BLUE SHIELD | Admitting: Neurology

## 2017-03-30 VITALS — BP 129/83 | HR 91 | Wt 127.0 lb

## 2017-03-30 DIAGNOSIS — Z09 Encounter for follow-up examination after completed treatment for conditions other than malignant neoplasm: Secondary | ICD-10-CM

## 2017-03-30 DIAGNOSIS — Z79899 Other long term (current) drug therapy: Secondary | ICD-10-CM | POA: Diagnosis not present

## 2017-03-30 DIAGNOSIS — R519 Headache, unspecified: Secondary | ICD-10-CM

## 2017-03-30 DIAGNOSIS — R51 Headache: Secondary | ICD-10-CM

## 2017-03-30 DIAGNOSIS — Z9889 Other specified postprocedural states: Secondary | ICD-10-CM | POA: Diagnosis not present

## 2017-03-30 DIAGNOSIS — G4452 New daily persistent headache (NDPH): Secondary | ICD-10-CM | POA: Diagnosis not present

## 2017-03-30 DIAGNOSIS — G500 Trigeminal neuralgia: Secondary | ICD-10-CM | POA: Diagnosis not present

## 2017-03-30 DIAGNOSIS — G44099 Other trigeminal autonomic cephalgias (TAC), not intractable: Secondary | ICD-10-CM | POA: Diagnosis not present

## 2017-03-30 MED ORDER — AMITRIPTYLINE HCL 25 MG PO TABS: 25.0000 mg | ORAL_TABLET | Freq: Every day | ORAL | 5 refills | Status: DC

## 2017-03-30 NOTE — Telephone Encounter (Signed)
Ashley Beard [phone] wanting to know if FMLA forms have been sent to [facility]

## 2017-03-30 NOTE — Telephone Encounter (Signed)
FMLA papers fax to provided number [phone]. FMLA forms were fax three times and confirmed.

## 2017-03-30 NOTE — Telephone Encounter (Signed)
Talked with Karleen Dolphin fax # is [phone]

## 2017-03-30 NOTE — Progress Notes (Signed)
HPI:                                                                Ashley Beard is a 51 y.o. female who presents to [facility]: [place] today for hospital follow-up  Patient with PMH of carotid artery disease s/p endarterectomy (02/24/17) and CVA (02/25/17) presents today for follow-up of left-sided facial pain/headaches. She states that these have occurred daily since her left carotid endarterectomy last month and have gradually worsened. She has been to the emergency department for this 3 times this week. She describes severe left-sided facial pain that starts below her left jaw/upper neck and radiates to her left eye. Pain is worse when lying down. She states episodes will last up to 15 minutes. She does not know what triggers them. Denies associated photophobia, phonophobia, nausea, vomiting, paresthesias, or weakness. She was most recently started on Carbamezapine by an ER doctor on 03/30/17 and she feels this is helping her. She is not complaining of any pain today.   Of note, patient states she was supposed to have some molars extracted prior to her event, but has been instructed by her vascular surgeon (Dr. Donnetta Hutching) that she cannot have dental surgery for the next 6 months. She denies any dental pain, fevers, or chills today.  Patient is followed by Dr. Leonie Man in Neurology. Her next follow-up appointment is in July.   Past Medical History:  Diagnosis Date  . Abnormal Pap smear of cervix    age 38  . Anxiety   . Carotid arterial disease ([place])   . Headache   . Refusal of blood transfusions as patient is Jehovah's Witness   . Stroke [facility])    Past Surgical History:  Procedure Laterality Date  . ENDARTERECTOMY Left 02/24/2017   Procedure: ENDARTERECTOMY CAROTID with removal of thrombus ;  Surgeon: Rosetta Posner, MD;  Location: [place];  Service: Vascular;  Laterality: Left;  . GYNECOLOGIC CRYOSURGERY     age 48  . IR GENERIC HISTORICAL  02/25/2017   IR  ANGIO VERTEBRAL SEL SUBCLAVIAN INNOMINATE UNI R MOD SED 02/25/2017 [name], MD [facility]  . IR GENERIC HISTORICAL  02/25/2017   IR ANGIO INTRA EXTRACRAN SEL COM CAROTID INNOMINATE BILAT MOD SED 02/25/2017 [name], MD [facility]  . OOPHORECTOMY     Social History  Substance Use Topics  . Smoking status: Former Smoker    Types: Cigarettes    Quit date: 09/2016  . Smokeless tobacco: Never Used     Comment: very rare, social  . Alcohol use 1.2 oz/week    2 Cans of beer per week     Comment: social   family history includes Diabetes in her maternal grandmother and other; Hypertension in her maternal grandmother and other; Stroke in her paternal grandfather.  ROS: negative except as noted in the HPI  Medications: Current Outpatient Prescriptions  Medication Sig Dispense Refill  . carbamazepine (TEGRETOL) 200 MG tablet Take 200 mg by mouth 3 (three) times daily.    . clindamycin (CLEOCIN) 300 MG capsule Take 1 capsule (300 mg total) by mouth 3 (three) times daily. 21 capsule 0  . clopidogrel (PLAVIX) 75 MG tablet Take 1 tablet (75 mg total) by mouth daily. [place]  tablet 2  . HYDROcodone-acetaminophen (NORCO/VICODIN) 5-325 MG tablet Take 1 tablet by mouth every 6 (six) hours as needed for moderate pain. 10 tablet 0   No current facility-administered medications for this visit.    Allergies  Allergen Reactions  . Penicillins Hives and Other (See Comments)    Nightmares Has patient had a PCN reaction causing immediate rash, facial/tongue/throat swelling, SOB or lightheadedness with hypotension: Yes Has patient had a PCN reaction causing severe rash involving mucus membranes or skin necrosis: No Has patient had a PCN reaction that required hospitalization pt was in the hospital at time of reaction Has patient had a PCN reaction occurring within the last 10 years: No If all of the above answers are "NO", then may proceed with Cephalosporin use.  . Fish Allergy Itching  and Swelling    Lymph node swelling  . Other Itching and Swelling    Reaction to blue cheese Lymph node swelling  . Shrimp [Shellfish Allergy] Itching and Swelling    Lymph node swelling  . Yeast-Related Products Itching and Swelling    Lymph node swelling       Objective:  BP 129/83   Pulse 91   Wt 127 lb (57.6 kg)   LMP 03/02/2017   BMI 25.65 kg/m  Gen: well-groomed, cooperative, not ill-appearing, no distress Pulm: Normal work of breathing, normal phonation, clear to auscultation bilaterally CV: Normal rate, regular rhythm, s1 and s2 distinct, no murmurs, clicks or rubs  Neuro: alert and oriented x 3, EOM's intact, normal tone, no tremor MSK: moving all extremities, normal gait and station, no peripheral edema Psych: good eye contact, normal affect, euthymic mood, normal speech and thought content  CLINICAL DATA:  Initial evaluation for acute stroke.  EXAM: MRI HEAD WITHOUT AND WITH CONTRAST  TECHNIQUE: Multiplanar, multiecho pulse sequences of the brain and surrounding structures were obtained without and with intravenous contrast.  CONTRAST:  54m MULTIHANCE GADOBENATE DIMEGLUMINE 529 MG/ML IV SOLN  COMPARISON:  Prior CT from earlier the same day as well as previous MRI from 02/23/2017.  FINDINGS: Brain: Multifocal areas of ischemic infarcts are seen involving predominantly the left MCA territory. Areas of infarction are predominantly cortical in nature, and involve the left frontal and parietal lobes. These are suspected to be embolic in nature. Additional cortical infarct within the left occipital lobe, likely due to fetal type PCA. Associated petechial hemorrhage within the areas of infarction without hemorrhagic transformation. No significant mass effect. Scattered leptomeningeal enhancement also noted within the area of infarct.  Previously noted approximate 1 cm FLAIR signal abnormality at the peripheral left temporal lobe is better seen on  today's study, and appears to be extra-axial in nature. This demonstrates solid postcontrast enhancement, and is most consistent with a small meningioma. Lesion measures 10 x 8 x 10 mm. No associated edema.  The otherwise stable appearance of the brain. Cerebral volume normal. No significant cerebral white matter disease. No other mass lesion or abnormal enhancement. No extra-axial fluid collection. Ventricles normal size without hydrocephalus. Major dural sinuses are patent.  Pituitary gland and suprasellar region within normal limits.  Vascular: Major intracranial vascular flow voids are maintained.  Skull and upper cervical spine: Craniocervical junction within normal limits. Visualized upper cervical spine unremarkable. Bone marrow signal intensity normal. No scalp soft tissue abnormality.  Sinuses/Orbits: Globes and orbital soft tissues within normal limits. Paranasal sinuses are clear. No mastoid effusion. Inner ear structures normal.  Other: No other significant finding.  IMPRESSION: 1. Multifocal ischemic predominantly  cortical left MCA territory infarcts involving the left frontal and parietal lobes. Again, these are suspected to be embolic in nature. Additional occipital lobe involvement likely due to fetal type PCA as previously mentioned. Associated petechial hemorrhage without frank hemorrhagic transformation. 2. 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 No associated edema.   Electronically Signed   By: Jeannine Boga M.D.   On: 02/25/2017 23:32  Assessment and Plan: 51 y.o. female with   NDPH (new daily persistent headache), Left facial pain - differential includes TN, migraine, cluster headache, dental pain. Hope to get neurology's input, but unfortunately they cannot see her until July - reviewed report from  [facility] 02/25/17 and findings include a flair signal abnormality in the left temporal lobe thought to be a meningioma - reviewed labs: patient had an elevated ESR (28) on 02/15/17, hypercoag studies ordered by Neuro were normal including Lupus anticoag - will check CMP, CBC diff, ESR, CRP, CCP, ANA, RF - will continue Tegretol and add Amitriptyline nightly - hope to manage patient's pain without the need for narcotic pain medication - since patient's pain has been so severe and prompted 3 ED visits in 1 week, will place the referral to pain management so patient can be set up with pain clinic proactively - Ambulatory referral to Pain Clinic - amitriptyline (ELAVIL) 25 MG tablet; Take 1 tablet (25 mg total) by mouth at bedtime.  Dispense: 30 tablet; Refill: 5 - follow-up in 2 weeks  On carbamazepine therapy - Carbamazepine Level (Tegretol), total - will check baseline on Friday  Patient education and anticipatory guidance given Patient agrees with treatment plan Follow-up in 2 weeks or sooner as needed if symptoms worsen or fail to improve  Darlyne Russian PA-C

## 2017-03-30 NOTE — Patient Instructions (Addendum)
- Go to the lab on Friday before you take your Tegretol for blood work - Start Amitriptyline 1 tablet at bedtime for headaches - Continue your other medications - You will get a phone call to schedule appointment with pain clinic - Follow-up in 1 month   General Headache Without Cause A headache is pain or discomfort felt around the head or neck area. The specific cause of a headache may not be found. There are many causes and types of headaches. A few common ones are:  Tension headaches.  Migraine headaches.  Cluster headaches.  Chronic daily headaches. Follow these instructions at home: Watch your condition for any changes. Take these steps to help with your condition: Managing pain   Take over-the-counter and prescription medicines only as told by your health care provider.  Lie down in a dark, quiet room when you have a headache.  If directed, apply ice to the head and neck area:  Put ice in a plastic bag.  Place a towel between your skin and the bag.  Leave the ice on for 20 minutes, 2-3 times per day.  Use a heating pad or hot shower to apply heat to the head and neck area as told by your health care provider.  Keep lights dim if bright lights bother you or make your headaches worse. Eating and drinking   Eat meals on a regular schedule.  Limit alcohol use.  Decrease the amount of caffeine you drink, or stop drinking caffeine. General instructions   Keep all follow-up visits as told by your health care provider. This is important.  Keep a headache journal to help find out what may trigger your headaches. For example, write down:  What you eat and drink.  How much sleep you get.  Any change to your diet or medicines.  Try massage or other relaxation techniques.  Limit stress.  Sit up straight, and do not tense your muscles.  Do not use tobacco products, including cigarettes, chewing tobacco, or e-cigarettes. If you need help quitting, ask your health  care provider.  Exercise regularly as told by your health care provider.  Sleep on a regular schedule. Get 7-9 hours of sleep, or the amount recommended by your health care provider. Contact a health care provider if:  Your symptoms are not helped by medicine.  You have a headache that is different from the usual headache.  You have nausea or you vomit.  You have a fever. Get help right away if:  Your headache becomes severe.  You have repeated vomiting.  You have a stiff neck.  You have a loss of vision.  You have problems with speech.  You have pain in the eye or ear.  You have muscular weakness or loss of muscle control.  You lose your balance or have trouble walking.  You feel faint or pass out.  You have confusion. This information is not intended to replace advice given to you by your health care provider. Make sure you discuss any questions you have with your health care provider. Document Released: 11/23/2005 Document Revised: 04/30/2016 Document Reviewed: 03/18/2015 Elsevier Interactive Patient Education  2017 [employer]  Trigeminal Neuralgia Trigeminal neuralgia is a nerve disorder that causes attacks of severe facial pain. The attacks last from a few seconds to several minutes. They can happen for days, weeks, or months and then go away for months or years. Trigeminal neuralgia is also called tic douloureux. What are the causes? This condition is caused by  damage to a nerve in the face that is called the trigeminal nerve. An attack can be triggered by:  Talking.  Chewing.  Putting on makeup.  Washing your face.  Shaving your face.  Brushing your teeth.  Touching your face. What increases the risk? This condition is more likely to develop in:  Women.  People who are 73 years of age or older. What are the signs or symptoms? The main symptom of this condition is pain in the jaw, lips, eyes, nose, scalp, forehead, and face. The pain may be  intense, stabbing, electric, or shock-like. How is this diagnosed? This condition is diagnosed with a physical exam. A CT scan or MRI may be done to rule out other conditions that can cause facial pain. How is this treated? This condition may be treated with:  Avoiding the things that trigger your attacks.  Pain medicine.  Surgery. This may be done in severe cases if other medical treatment does not provide relief. Follow these instructions at home:  Take over-the-counter and prescription medicines only as told by your health care provider.  If you wish to get pregnant, talk with your health care provider before you start trying to get pregnant.  Avoid the things that trigger your attacks. It may help to:  Chew on the unaffected side of your mouth.  Avoid touching your face.  Avoid blasts of hot or cold air. Contact a health care provider if:  Your pain medicine is not helping.  You develop new, unexplained symptoms, such as:  Double vision.  Facial weakness.  Changes in hearing or balance.  You become pregnant. Get help right away if:  Your pain is unbearable, and your pain medicine does not help. This information is not intended to replace advice given to you by your health care provider. Make sure you discuss any questions you have with your health care provider. Document Released: 11/20/2000 Document Revised: 07/26/2016 Document Reviewed: 03/18/2015 Elsevier Interactive Patient Education  2017 [employer].

## 2017-03-30 NOTE — Telephone Encounter (Signed)
PCP office called wanting to schedule an earlier appt than 7/17. I explained the pt had an appt for today, then it was c/a the same day by the daughter. Pt's fiancee declined to schedule an appt. I advised her also Dr Leonie Man did not have an opening until 7/18.

## 2017-03-30 NOTE — Telephone Encounter (Signed)
Pt has an appt in [place] with Dr. Leonie Man for stroke follow up. Pt went to ED on 03/29/2017 and 03/30/2017. Pt was offer appt on Tuesday and her fiancee decline the appt. Also pt was offer an appt on 4/.24/2018 and the daughter call back to cancel appt the same day. Pt was given tegretol by the ED doctors in [facility]. Pt needs to take the medication per Dr. Leonie Man. If she is having any side effects we can discuss this with Dr. Leonie Man.

## 2017-03-31 DIAGNOSIS — Z0289 Encounter for other administrative examinations: Secondary | ICD-10-CM

## 2017-04-01 ENCOUNTER — Inpatient Hospital Stay: Payer: BLUE CROSS/BLUE SHIELD | Admitting: Physician Assistant

## 2017-04-02 ENCOUNTER — Telehealth: Payer: Self-pay

## 2017-04-02 DIAGNOSIS — G4452 New daily persistent headache (NDPH): Secondary | ICD-10-CM | POA: Diagnosis not present

## 2017-04-02 DIAGNOSIS — Z79899 Other long term (current) drug therapy: Secondary | ICD-10-CM | POA: Diagnosis not present

## 2017-04-02 LAB — CBC WITH DIFFERENTIAL/PLATELET
BASOS ABS: 0 {cells}/uL (ref 0–200)
Basophils Relative: 0 %
EOS PCT: 2 %
Eosinophils Absolute: 124 cells/uL (ref 15–500)
HEMATOCRIT: 38.4 % (ref 35.0–45.0)
HEMOGLOBIN: 12.4 g/dL (ref 11.7–15.5)
LYMPHS ABS: 1240 {cells}/uL (ref 850–3900)
Lymphocytes Relative: 20 %
MCH: 31 pg (ref 27.0–33.0)
MCHC: 32.3 g/dL (ref 32.0–36.0)
MCV: 96 fL (ref 80.0–100.0)
MONO ABS: 434 {cells}/uL (ref 200–950)
MPV: 10.3 fL (ref 7.5–12.5)
Monocytes Relative: 7 %
NEUTROS ABS: 4402 {cells}/uL (ref 1500–7800)
NEUTROS PCT: 71 %
Platelets: 208 10*3/uL (ref 140–400)
RBC: 4 MIL/uL (ref 3.80–5.10)
RDW: 13.7 % (ref 11.0–15.0)
WBC: 6.2 10*3/uL (ref 3.8–10.8)

## 2017-04-02 LAB — COMPREHENSIVE METABOLIC PANEL
ALBUMIN: 3.9 g/dL (ref 3.6–5.1)
ALK PHOS: 64 U/L (ref 33–115)
ALT: 17 U/L (ref 6–29)
AST: 15 U/L (ref 10–35)
BUN: 14 mg/dL (ref 7–25)
CO2: 28 mmol/L (ref 20–31)
Calcium: 8.7 mg/dL (ref 8.6–10.2)
Chloride: 103 mmol/L (ref 98–110)
Creat: 0.8 mg/dL (ref 0.50–1.10)
Glucose, Bld: 77 mg/dL (ref 65–99)
Potassium: 3.9 mmol/L (ref 3.5–5.3)
SODIUM: 138 mmol/L (ref 135–146)
TOTAL PROTEIN: 6.8 g/dL (ref 6.1–8.1)
Total Bilirubin: 0.3 mg/dL (ref 0.2–1.2)

## 2017-04-02 NOTE — Telephone Encounter (Signed)
LEft vm for patient to call back about lab work results. 

## 2017-04-02 NOTE — Telephone Encounter (Signed)
-----   Message from Garvin Fila, MD sent at 03/19/2017  4:06 PM EDT ----- Mitchell Heir inform the patient that hypercoagulable lab work was all normal

## 2017-04-03 LAB — SEDIMENTATION RATE: SED RATE: 17 mm/h (ref 0–20)

## 2017-04-03 LAB — CK: CK TOTAL: 40 U/L (ref 7–177)

## 2017-04-03 LAB — CARBAMAZEPINE LEVEL, TOTAL: Carbamazepine Lvl: 11.6 mg/L (ref 4.0–12.0)

## 2017-04-05 ENCOUNTER — Telehealth: Payer: Self-pay

## 2017-04-05 DIAGNOSIS — I69398 Other sequelae of cerebral infarction: Secondary | ICD-10-CM | POA: Diagnosis not present

## 2017-04-05 DIAGNOSIS — R41841 Cognitive communication deficit: Secondary | ICD-10-CM | POA: Diagnosis not present

## 2017-04-05 LAB — C-REACTIVE PROTEIN: CRP: 11.2 mg/L — AB (ref <8.0)

## 2017-04-05 LAB — RHEUMATOID FACTOR

## 2017-04-05 LAB — CYCLIC CITRUL PEPTIDE ANTIBODY, IGG: Cyclic Citrullin Peptide Ab: <16 Units

## 2017-04-05 LAB — ANA: ANA: NEGATIVE

## 2017-04-05 NOTE — Telephone Encounter (Signed)
Rn receive incoming call from patient. Pt was transferred to nurse. Rn stated her hypercoagulable lab work was normal. PT verbalized understanding.

## 2017-04-05 NOTE — Telephone Encounter (Signed)
Left vm notifying pt that FMLA papers are ready for pick up -EH/RMA

## 2017-04-05 NOTE — Telephone Encounter (Signed)
-----   Message from [facility], [place] sent at 04/05/2017  8:39 AM EDT ----- Regarding: FMLA ready Let Ashley Beard know her FMLA paperwork is ready

## 2017-04-06 ENCOUNTER — Other Ambulatory Visit: Payer: Self-pay

## 2017-04-06 DIAGNOSIS — I63132 Cerebral infarction due to embolism of left carotid artery: Secondary | ICD-10-CM

## 2017-04-12 DIAGNOSIS — R41841 Cognitive communication deficit: Secondary | ICD-10-CM | POA: Diagnosis not present

## 2017-04-12 DIAGNOSIS — I69398 Other sequelae of cerebral infarction: Secondary | ICD-10-CM | POA: Diagnosis not present

## 2017-04-12 DIAGNOSIS — I6330 Cerebral infarction due to thrombosis of unspecified cerebral artery: Secondary | ICD-10-CM | POA: Diagnosis not present

## 2017-04-13 NOTE — Progress Notes (Signed)
Referring-Ashley Beard, Lucy Antigua, MD Reason for referral-CVA  HPI: 51 yo female for evaluation of CVA at request of Dr Antony Contras. Pt recently admitted with neck pain; CT showed clot in left carotid bifurcation. MRI showed left MCA infarct. Echo 3/18 showed normal LV function. Carotid dopplers 3/18 showed 1-39 right and 80-99 left stenosis. Had L CEA 3/18. Thrombus was felt to be embolic. 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 Arteriogram revealed 50 at left CEA site and associated filling defects possibly related to clot. Treated with ASA and plavix. Cardiology now asked to evaluate for TEE. Patient denies dyspnea, chest pain, palpitations or syncope. No history of dysphagia.  Current Outpatient Prescriptions  Medication Sig Dispense Refill  . amitriptyline (ELAVIL) 25 MG tablet Take 1 tablet (25 mg total) by mouth at bedtime. 30 tablet 5  . carbamazepine (TEGRETOL) 200 MG tablet Take 200 mg by mouth 3 (three) times daily.    . clopidogrel (PLAVIX) 75 MG tablet Take 1 tablet (75 mg total) by mouth daily. 30 tablet 2   No current facility-administered medications for this visit.     Allergies  Allergen Reactions  . Penicillins Hives and Other (See Comments)    Nightmares Has patient had a PCN reaction causing immediate rash, facial/tongue/throat swelling, SOB or lightheadedness with hypotension: Yes Has patient had a PCN reaction causing severe rash involving mucus membranes or skin necrosis: No Has patient had a PCN reaction that required hospitalization pt was in the hospital at time of reaction Has patient had a PCN reaction occurring within the last 10 years: No If all of the above answers are "NO", then may proceed with Cephalosporin use.  . Fish Allergy Itching and Swelling    Lymph node swelling  . Other Itching and Swelling    Reaction to blue cheese Lymph node swelling  . Shrimp [Shellfish Allergy] Itching and Swelling    Lymph node swelling  . Yeast-Related Products  Itching and Swelling    Lymph node swelling     Past Medical History:  Diagnosis Date  . Abnormal Pap smear of cervix    age 8  . Anxiety   . Carotid arterial disease ([place])   . Headache   . Refusal of blood transfusions as patient is Jehovah's Witness   . Stroke [facility])     Past Surgical History:  Procedure Laterality Date  . ENDARTERECTOMY Left 02/24/2017   Procedure: ENDARTERECTOMY CAROTID with removal of thrombus ;  Surgeon: Rosetta Posner, MD;  Location: [place];  Service: Vascular;  Laterality: Left;  . GYNECOLOGIC CRYOSURGERY     age 3  . IR GENERIC HISTORICAL  02/25/2017   IR ANGIO VERTEBRAL SEL SUBCLAVIAN INNOMINATE UNI R MOD SED 02/25/2017 [name], MD [facility]INTER[facility]  . IR GENERIC HISTORICAL  02/25/2017   IR ANGIO INTRA EXTRACRAN SEL COM CAROTID INNOMINATE BILAT MOD SED 02/25/2017 [name], MD [facility]  . OOPHORECTOMY      Social History   Social History  . Marital status: Divorced    Spouse name: N/A  . Number of children: 3  . Years of education: N/A   Occupational History  . Not on file.   Social History Main Topics  . Smoking status: Former Smoker    Types: Cigarettes    Quit date: 09/2016  . Smokeless tobacco: Never Used     Comment: very rare, social  . Alcohol use 1.2 oz/week    2 Cans of beer per week  Comment: social  . Drug use: No  . Sexual activity: Yes    Birth control/ protection: Condom   Other Topics Concern  . Not on file   Social History Narrative  . No narrative on file    Family History  Problem Relation Age of Onset  . Hypertension Other   . Diabetes Other   . Diabetes Maternal Grandmother   . Hypertension Maternal Grandmother   . Stroke Paternal Grandfather     ROS: no fevers or chills, productive cough, hemoptysis, dysphasia, odynophagia, melena, hematochezia, dysuria, hematuria, rash, seizure activity, orthopnea, PND, pedal edema, claudication. Remaining systems are negative.  Physical Exam:    Blood pressure 138/89, pulse 86, height 4\' 11"  (1.499 m), weight 60.7 kg (133 lb 12.8 oz).  General:  Well developed/well nourished in NAD Skin warm/dry Patient not depressed No peripheral clubbing Back-normal HEENT-normal/normal eyelids Neck supple/normal carotid upstroke bilaterally; no bruits; no JVD; no thyromegaly status post left carotid endarterectomy,  chest - CTA/ normal expansion CV - RRR/normal S1 and S2; no murmurs, rubs or gallops;  PMI nondisplaced Abdomen -NT/ND, no HSM, no mass, + bowel sounds, no bruit 2+ femoral pulses, no bruits Ext-no edema, chords, 2+ DP Neuro-mild dysarthria but otherwise unremarkable.   ECG - sinus rhythm at a rate of 86. No significant ST changes. personally reviewed  A/P  1 Recent CVA-there is concern that this could have been embolic. We will arrange a transesophageal echocardiogram to rule out cardiac source of embolus including patent foramen ovale. Continue Plavix. Otherwise management per neurology. Dr Leonie Man has also arranged a hypercoagulable workup.   2 status post carotid endarterectomy-continue Plavix. Management per vascular surgery.  Kirk Ruths, MD

## 2017-04-19 DIAGNOSIS — R41841 Cognitive communication deficit: Secondary | ICD-10-CM | POA: Diagnosis not present

## 2017-04-19 DIAGNOSIS — I69398 Other sequelae of cerebral infarction: Secondary | ICD-10-CM | POA: Diagnosis not present

## 2017-04-20 ENCOUNTER — Encounter: Payer: Self-pay | Admitting: Cardiology

## 2017-04-21 ENCOUNTER — Encounter: Payer: Self-pay | Admitting: *Deleted

## 2017-04-21 ENCOUNTER — Encounter: Payer: Self-pay | Admitting: Cardiology

## 2017-04-21 ENCOUNTER — Ambulatory Visit (INDEPENDENT_AMBULATORY_CARE_PROVIDER_SITE_OTHER): Payer: BLUE CROSS/BLUE SHIELD | Admitting: Cardiology

## 2017-04-21 VITALS — BP 138/89 | HR 86 | Ht 59.0 in | Wt 133.8 lb

## 2017-04-21 DIAGNOSIS — I639 Cerebral infarction, unspecified: Secondary | ICD-10-CM | POA: Diagnosis not present

## 2017-04-21 NOTE — Patient Instructions (Addendum)
Your physician has requested that you have a TEE. During a TEE, sound waves are used to create images of your heart. It provides your doctor with information about the size and shape of your heart and how well your heart's chambers and valves are working. In this test, a transducer is attached to the end of a flexible tube that's guided down your throat and into your esophagus (the tube leading from you mouth to your stomach) to get a more detailed image of your heart. You are not awake for the procedure. Please see the instruction sheet given to you today. For further information please visit [email or web address].   Apr 21, 2017  MRN: [id number]  Ashley Beard [address] [place] [address]  Dear [name],  You are scheduled for a TEE on Tuesday 05/04/17 with Dr. Stanford Breed.  Please arrive at the [facility] at 6:30 TO 7 AM   the day of your procedure.  1.) Diet:  A.) Nothing to eat or drink after midnight except your medications with a sip of water.  2.) Must have a responsible person to drive you home.  3.) Bring your current insurance cards and current list of all your medications.   *Special Note:  Every effort is made to have your procedure done on time.  Occasionally there are emergencies that present themselves at the hospital that may cause delays.  Please be patient if a delay does occur.  *If you have any questions after you get home, please call the office at [phone]

## 2017-04-26 DIAGNOSIS — I69398 Other sequelae of cerebral infarction: Secondary | ICD-10-CM | POA: Diagnosis not present

## 2017-04-26 DIAGNOSIS — R41841 Cognitive communication deficit: Secondary | ICD-10-CM | POA: Diagnosis not present

## 2017-04-27 ENCOUNTER — Encounter: Payer: Self-pay | Admitting: Neurology

## 2017-04-27 ENCOUNTER — Telehealth: Payer: Self-pay | Admitting: Neurology

## 2017-04-27 NOTE — Telephone Encounter (Signed)
This will have to be address when Dr. Leonie Man is back in the office on May 10, 2017. He returns the hospital working May 29. Message sent to him. He will have to call patient when he returns next week.

## 2017-04-27 NOTE — Telephone Encounter (Signed)
Pt is asking for a call back re: her returning back to work.

## 2017-04-27 NOTE — Telephone Encounter (Signed)
Human Resources contacted patient stating her job is not obligated to hold her position and benefits will be cut off June 7th.  Patient is trying to return to work before June 7th due to needing a job.  Please call

## 2017-04-27 NOTE — Telephone Encounter (Signed)
IF patient calls back she cannot return to work until she is evaluated by Dr. Leonie Man. This is per Dr. Leonie Man last note she is out of work till next appt in July 2018. Pt has vision issues, and weakness. Her disability from states out of work till next appt in July 2018.

## 2017-04-28 ENCOUNTER — Encounter: Payer: Self-pay | Admitting: Physician Assistant

## 2017-04-29 ENCOUNTER — Ambulatory Visit: Payer: BLUE CROSS/BLUE SHIELD | Admitting: Physician Assistant

## 2017-04-29 ENCOUNTER — Ambulatory Visit (INDEPENDENT_AMBULATORY_CARE_PROVIDER_SITE_OTHER): Payer: BLUE CROSS/BLUE SHIELD | Admitting: Physician Assistant

## 2017-04-29 VITALS — BP 142/81 | HR 85 | Temp 97.7°F | Wt 131.0 lb

## 2017-04-29 DIAGNOSIS — Z8673 Personal history of transient ischemic attack (TIA), and cerebral infarction without residual deficits: Secondary | ICD-10-CM | POA: Diagnosis not present

## 2017-04-29 DIAGNOSIS — Z0279 Encounter for issue of other medical certificate: Secondary | ICD-10-CM

## 2017-04-29 DIAGNOSIS — R030 Elevated blood-pressure reading, without diagnosis of hypertension: Secondary | ICD-10-CM

## 2017-04-29 NOTE — Patient Instructions (Addendum)
For your blood pressure: - Check blood pressure at home for the next 2 weeks - Check around the same time each day in a relaxed setting (rest for at least 3 minutes, legs uncrossed, arm resting on a table at heart level) - Limit salt. Follow DASH eating plan - Return for a nurse visit blood pressure check in 2 weeks. Avoid caffeine before your visit   [place] stands for "Dietary Approaches to Stop Hypertension." The DASH eating plan is a healthy eating plan that has been shown to reduce high blood pressure (hypertension). It may also reduce your risk for type 2 diabetes, heart disease, and stroke. The DASH eating plan may also help with weight loss. What are tips for following this plan? General guidelines   Avoid eating more than 2,300 mg (milligrams) of salt (sodium) a day. If you have hypertension, you may need to reduce your sodium intake to 1,500 mg a day.  Limit alcohol intake to no more than 1 drink a day for nonpregnant women and 2 drinks a day for men. One drink equals 12 oz of beer, 5 oz of wine, or 1 oz of hard liquor.  Work with your health care provider to maintain a healthy body weight or to lose weight. Ask what an ideal weight is for you.  Get at least 30 minutes of exercise that causes your heart to beat faster (aerobic exercise) most days of the week. Activities may include walking, swimming, or biking.  Work with your health care provider or diet and nutrition specialist (dietitian) to adjust your eating plan to your individual calorie needs. Reading food labels   Check food labels for the amount of sodium per serving. Choose foods with less than 5 percent of the Daily Value of sodium. Generally, foods with less than 300 mg of sodium per serving fit into this eating plan.  To find whole grains, look for the word "whole" as the first word in the ingredient list. Shopping   Buy products labeled as "low-sodium" or "no salt added."  Buy fresh foods. Avoid  canned foods and premade or frozen meals. Cooking   Avoid adding salt when cooking. Use salt-free seasonings or herbs instead of table salt or sea salt. Check with your health care provider or pharmacist before using salt substitutes.  Do not fry foods. Cook foods using healthy methods such as baking, boiling, grilling, and broiling instead.  Cook with heart-healthy oils, such as olive, canola, soybean, or sunflower oil. Meal planning    Eat a balanced diet that includes:  5 or more servings of fruits and vegetables each day. At each meal, try to fill half of your plate with fruits and vegetables.  Up to 6-8 servings of whole grains each day.  Less than 6 oz of lean meat, poultry, or fish each day. A 3-oz serving of meat is about the same size as a deck of cards. One egg equals 1 oz.  2 servings of low-fat dairy each day.  A serving of nuts, seeds, or beans 5 times each week.  Heart-healthy fats. Healthy fats called Omega-3 fatty acids are found in foods such as flaxseeds and coldwater fish, like sardines, salmon, and mackerel.  Limit how much you eat of the following:  Canned or prepackaged foods.  Food that is high in trans fat, such as fried foods.  Food that is high in saturated fat, such as fatty meat.  Sweets, desserts, sugary drinks, and other foods with added  sugar.  Full-fat dairy products.  Do not salt foods before eating.  Try to eat at least 2 vegetarian meals each week.  Eat more home-cooked food and less restaurant, buffet, and fast food.  When eating at a restaurant, ask that your food be prepared with less salt or no salt, if possible. What foods are recommended? The items listed may not be a complete list. Talk with your dietitian about what dietary choices are best for you. Grains  Whole-grain or whole-wheat bread. Whole-grain or whole-wheat pasta. Brown rice. Modena Morrow. Bulgur. Whole-grain and low-sodium cereals. Pita bread. Low-fat, low-sodium  crackers. Whole-wheat flour tortillas. Vegetables  Fresh or frozen vegetables (raw, steamed, roasted, or grilled). Low-sodium or reduced-sodium tomato and vegetable juice. Low-sodium or reduced-sodium tomato sauce and tomato paste. Low-sodium or reduced-sodium canned vegetables. Fruits  All fresh, dried, or frozen fruit. Canned fruit in natural juice (without added sugar). Meat and other protein foods  Skinless chicken or [place]. Ground chicken or [place]. Pork with fat trimmed off. Fish and seafood. Egg whites. Dried beans, peas, or lentils. Unsalted nuts, nut butters, and seeds. Unsalted canned beans. Lean cuts of beef with fat trimmed off. Low-sodium, lean deli meat. Dairy  Low-fat (1%) or fat-free (skim) milk. Fat-free, low-fat, or reduced-fat cheeses. Nonfat, low-sodium ricotta or cottage cheese. Low-fat or nonfat yogurt. Low-fat, low-sodium cheese. Fats and oils  Soft margarine without trans fats. Vegetable oil. Low-fat, reduced-fat, or light mayonnaise and salad dressings (reduced-sodium). Canola, safflower, olive, soybean, and sunflower oils. Avocado. Seasoning and other foods  Herbs. Spices. Seasoning mixes without salt. Unsalted popcorn and pretzels. Fat-free sweets. What foods are not recommended? The items listed may not be a complete list. Talk with your dietitian about what dietary choices are best for you. Grains  Baked goods made with fat, such as croissants, muffins, or some breads. Dry pasta or rice meal packs. Vegetables  Creamed or fried vegetables. Vegetables in a cheese sauce. Regular canned vegetables (not low-sodium or reduced-sodium). Regular canned tomato sauce and paste (not low-sodium or reduced-sodium). Regular tomato and vegetable juice (not low-sodium or reduced-sodium). Angie Fava. Olives. Fruits  Canned fruit in a light or heavy syrup. Fried fruit. Fruit in cream or butter sauce. Meat and other protein foods  Fatty cuts of meat. Ribs. Fried meat. Berniece Salines. Sausage.  Bologna and other processed lunch meats. Salami. Fatback. Hotdogs. Bratwurst. Salted nuts and seeds. Canned beans with added salt. Canned or smoked fish. Whole eggs or egg yolks. Chicken or [place] with skin. Dairy  Whole or 2% milk, cream, and half-and-half. Whole or full-fat cream cheese. Whole-fat or sweetened yogurt. Full-fat cheese. Nondairy creamers. Whipped toppings. Processed cheese and cheese spreads. Fats and oils  Butter. Stick margarine. Lard. Shortening. Ghee. Bacon fat. Tropical oils, such as coconut, palm kernel, or palm oil. Seasoning and other foods  Salted popcorn and pretzels. Onion salt, garlic salt, seasoned salt, table salt, and sea salt. Worcestershire sauce. Tartar sauce. Barbecue sauce. Teriyaki sauce. Soy sauce, including reduced-sodium. Steak sauce. Canned and packaged gravies. Fish sauce. Oyster sauce. Cocktail sauce. Horseradish that you find on the shelf. Ketchup. Mustard. Meat flavorings and tenderizers. Bouillon cubes. Hot sauce and Tabasco sauce. Premade or packaged marinades. Premade or packaged taco seasonings. Relishes. Regular salad dressings. Where to find more information:  National Heart, Lung, and [place]: https://wilson-eaton.com/  American Heart Association: www.heart.org Summary  The DASH eating plan is a healthy eating plan that has been shown to reduce high blood pressure (hypertension). It may also reduce your  risk for type 2 diabetes, heart disease, and stroke.  With the DASH eating plan, you should limit salt (sodium) intake to 2,300 mg a day. If you have hypertension, you may need to reduce your sodium intake to 1,500 mg a day.  When on the DASH eating plan, aim to eat more fresh fruits and vegetables, whole grains, lean proteins, low-fat dairy, and heart-healthy fats.  Work with your health care provider or diet and nutrition specialist (dietitian) to adjust your eating plan to your individual calorie needs. This information is not intended to  replace advice given to you by your health care provider. Make sure you discuss any questions you have with your health care provider. Document Released: 11/12/2011 Document Revised: 11/16/2016 Document Reviewed: 11/16/2016 Elsevier Interactive Patient Education  2017 [employer].

## 2017-04-29 NOTE — Progress Notes (Signed)
HPI:                                                                Ashley Beard is a 51 y.o. 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 Her desired return to work date is June 5th.  She was previously having post-op persistent daily headaches and left-sided facial pain. She reports headaches have improved significantly. She self-discontinued Carbamezepine and Amitriptyline two weeks ago because she was not having headaches. Headaches have returned 2 days per week, but they are less severe. They are still right-sided and she describes them as a "slight, sitting pain." They last hours and are relieved by sleep. She reports associated photosensitivity. She denies any facial pain. Denies nausea, dizziness, or paresthesias.   She continues to follow with Cardiology, Vascular Surgery, Neurology, and OT. She reports she is feeling significantly improved since her CVA and denies any dysphagia, weakness, paresis, or falls.     Past Medical History:  Diagnosis Date  . Abnormal Pap smear of cervix    age 51  . Anxiety   . Carotid arterial disease ([place])   . Headache   . Refusal of blood transfusions as patient is Jehovah's Witness   . Stroke [facility])    Past Surgical History:  Procedure Laterality Date  . ENDARTERECTOMY Left 02/24/2017   Procedure: ENDARTERECTOMY CAROTID with removal of thrombus ;  Surgeon: Rosetta Posner, MD;  Location: [place];  Service: Vascular;  Laterality: Left;  . GYNECOLOGIC CRYOSURGERY     age 62  . IR GENERIC HISTORICAL  02/25/2017   IR ANGIO VERTEBRAL SEL SUBCLAVIAN INNOMINATE UNI R MOD SED 02/25/2017 [name], MD [facility]  . IR GENERIC HISTORICAL   02/25/2017   IR ANGIO INTRA EXTRACRAN SEL COM CAROTID INNOMINATE BILAT MOD SED 02/25/2017 [name], MD [facility]  . OOPHORECTOMY     Social History  Substance Use Topics  . Smoking status: Former Smoker    Types: Cigarettes    Quit date: 09/2016  . Smokeless tobacco: Never Used     Comment: very rare, social  . Alcohol use 1.2 oz/week    2 Cans of beer per week     Comment: social   family history includes Diabetes in her maternal grandmother and other; Hypertension in her maternal grandmother and other; Stroke in her paternal grandfather.  ROS: negative except as noted in the HPI  Medications: Current Outpatient Prescriptions  Medication Sig Dispense Refill  . clopidogrel (PLAVIX) 75 MG tablet Take 1 tablet (75 mg total) by mouth daily. 30 tablet 2   No current facility-administered medications for this visit.    Allergies  Allergen Reactions  . Penicillins Hives and Other (See Comments)    Nightmares Has patient had a PCN reaction causing immediate rash, facial/tongue/throat swelling, SOB or lightheadedness with hypotension: Yes Has patient had a PCN reaction causing severe rash involving mucus membranes or skin necrosis: No Has patient had a PCN reaction that required hospitalization pt  was in the hospital at time of reaction Has patient had a PCN reaction occurring within the last 10 years: No If all of the above answers are "NO", then may proceed with Cephalosporin use.  . Fish Allergy Itching and Swelling    Lymph node swelling  . Other Itching and Swelling    Reaction to blue cheese Lymph node swelling  . Shrimp [Shellfish Allergy] Itching and Swelling    Lymph node swelling  . Yeast-Related Products Itching and Swelling    Lymph node swelling       Objective:  BP (!) 142/81   Pulse 85   Temp 97.7 F (36.5 C) (Oral)   Wt 131 lb (59.4 kg)   SpO2 99%   BMI 26.46 kg/m  Gen: well-groomed, not ill-appearing, no acute distress HEENT: head  normocephalic, atraumatic; normal conjunctiva, oropharynx clear, moist mucus membranes, neck supple, well-healed left-sided surgical incision on the lateral neck Pulm: Normal work of breathing, normal phonation, clear to auscultation bilaterally CV: Normal rate, regular rhythm, s1 and s2 distinct, no murmurs, clicks or rubs Neuro: visual acuity R 20/13, L 20/15, B/l 20/13 without correction; cranial nerves II-XII intact, no nystagmus, normal finger-to-nose, normal heel-to-shin, normal rapid alternating movements, negative pronator drift, normal coordination, DTR's intact, normal tone, no tremor MSK: strength 5/5 and symmetric in bilateral upper and lower extremities, normal gait and station, negative Romberg Mental Status: alert and oriented x 3, normal speech, cooperative, organized thought content  No results found for this or any previous visit (from the past 72 hour(s)). No results found.  Assessment and Plan: 51 y.o. female with  1. Elevated blood pressure reading BP Readings from Last 3 Encounters:  04/29/17 (!) 142/81  04/21/17 138/89  03/30/17 129/83  - given history of CVA, tight blood pressure control <130/80 is very important - patient will monitor BP's at home - limit salt - return in 2 weeks with BP log for a nurse visit BP check  2. History of cerebrovascular accident (CVA) from left carotid artery occlusion involving left middle cerebral artery territory - cont Plavix daily per Vascular - keep all follow-up appointments with Neuro, Vascular and Cardio - TEE per Cardiology  3. Encounter for issuance of medical certificate - patient cleared to return to work without restrictions. Work note provided  Patient education and anticipatory guidance given Patient agrees with treatment plan Follow-up in 2 weeks for nurse visit BP check or sooner as needed if symptoms worsen or fail to improve  Darlyne Russian PA-C

## 2017-04-29 NOTE — Progress Notes (Signed)
Pt is here to follow up for returning to work.

## 2017-05-04 ENCOUNTER — Encounter (HOSPITAL_COMMUNITY)
Admission: RE | Disposition: A | Discharge status: Home / Self Care | Payer: Self-pay | Source: Ambulatory Visit | Attending: Cardiology

## 2017-05-04 ENCOUNTER — Ambulatory Visit: Payer: BLUE CROSS/BLUE SHIELD | Admitting: Physician Assistant

## 2017-05-04 ENCOUNTER — Ambulatory Visit (HOSPITAL_BASED_OUTPATIENT_CLINIC_OR_DEPARTMENT_OTHER): Payer: BLUE CROSS/BLUE SHIELD

## 2017-05-04 ENCOUNTER — Encounter (HOSPITAL_COMMUNITY): Payer: Self-pay | Admitting: *Deleted

## 2017-05-04 ENCOUNTER — Ambulatory Visit (HOSPITAL_COMMUNITY)
Admission: RE | Admit: 2017-05-04 | Discharge: 2017-05-04 | Disposition: A | Discharge status: Home / Self Care | Payer: BLUE CROSS/BLUE SHIELD | Source: Ambulatory Visit | Attending: Cardiology | Admitting: Cardiology

## 2017-05-04 DIAGNOSIS — Z87891 Personal history of nicotine dependence: Secondary | ICD-10-CM | POA: Diagnosis not present

## 2017-05-04 DIAGNOSIS — Z7902 Long term (current) use of antithrombotics/antiplatelets: Secondary | ICD-10-CM | POA: Diagnosis not present

## 2017-05-04 DIAGNOSIS — M542 Cervicalgia: Secondary | ICD-10-CM | POA: Insufficient documentation

## 2017-05-04 DIAGNOSIS — Z91013 Allergy to seafood: Secondary | ICD-10-CM | POA: Insufficient documentation

## 2017-05-04 DIAGNOSIS — I639 Cerebral infarction, unspecified: Secondary | ICD-10-CM | POA: Insufficient documentation

## 2017-05-04 DIAGNOSIS — I638 Other cerebral infarction: Secondary | ICD-10-CM

## 2017-05-04 DIAGNOSIS — F419 Anxiety disorder, unspecified: Secondary | ICD-10-CM | POA: Insufficient documentation

## 2017-05-04 DIAGNOSIS — Z880 Allergy status to penicillin: Secondary | ICD-10-CM | POA: Insufficient documentation

## 2017-05-04 HISTORY — PX: TEE WITHOUT CARDIOVERSION: SHX5443

## 2017-05-04 SURGERY — ECHOCARDIOGRAM, TRANSESOPHAGEAL
Anesthesia: Moderate Sedation

## 2017-05-04 MED ORDER — BUTAMBEN-TETRACAINE-BENZOCAINE 2-2-14 % EX AERO
INHALATION_SPRAY | CUTANEOUS | Status: DC | PRN
  Administered 2017-05-04: 2 via TOPICAL

## 2017-05-04 MED ORDER — SODIUM CHLORIDE 0.9 % IV SOLN: INTRAVENOUS | Status: DC

## 2017-05-04 MED ORDER — DIPHENHYDRAMINE HCL 50 MG/ML IJ SOLN
INTRAMUSCULAR | Status: AC
  Filled 2017-05-04: qty 1

## 2017-05-04 MED ORDER — MIDAZOLAM HCL 10 MG/2ML IJ SOLN
INTRAMUSCULAR | Status: DC | PRN
  Administered 2017-05-04 (×2): 1 mg via INTRAVENOUS
  Administered 2017-05-04 (×2): 2 mg via INTRAVENOUS

## 2017-05-04 MED ORDER — MIDAZOLAM HCL 5 MG/ML IJ SOLN
INTRAMUSCULAR | Status: AC
  Filled 2017-05-04: qty 2

## 2017-05-04 MED ORDER — FENTANYL CITRATE (PF) 100 MCG/2ML IJ SOLN
INTRAMUSCULAR | Status: AC
  Filled 2017-05-04: qty 2

## 2017-05-04 MED ORDER — FENTANYL CITRATE (PF) 100 MCG/2ML IJ SOLN
INTRAMUSCULAR | Status: DC | PRN
  Administered 2017-05-04 (×2): 25 ug via INTRAVENOUS

## 2017-05-04 NOTE — Progress Notes (Signed)
  Echocardiogram Echocardiogram Transesophageal has been performed.  Ashley Beard 05/04/2017, 9:20 AM

## 2017-05-04 NOTE — Interval H&P Note (Signed)
History and Physical Interval Note:  05/04/2017 8:11 AM  Ashley Beard  has presented today for surgery, with the diagnosis of CVA  The various methods of treatment have been discussed with the patient and family. After consideration of risks, benefits and other options for treatment, the patient has consented to  Procedure(s): TRANSESOPHAGEAL ECHOCARDIOGRAM (TEE) (N/A) as a surgical intervention .  The patient's history has been reviewed, patient examined, no change in status, stable for surgery.  I have reviewed the patient's chart and labs.  Questions were answered to the patient's satisfaction.     Kirk Ruths

## 2017-05-04 NOTE — Discharge Instructions (Signed)

## 2017-05-04 NOTE — H&P (Signed)
Office Visit   04/21/2017 [facility]  [place], Denice Bors, MD  Cardiology   Cerebrovascular accident (CVA), unspecified mechanism [facility])  Dx   New Patient (Initial Visit) ; Referred by Trixie Dredge, PA-C  Reason for Visit   Additional Documentation   Vitals:   BP 138/89   Pulse 86   Ht 4\' 11"  (1.499 m)   Wt 60.7 kg (133 lb 12.8 oz)   LMP 03/25/2017   BMI 27.02 kg/m   BSA 1.59 m      More Vitals   Flowsheets:   Custom Formula Data,   MEWS Score,   Anthropometrics     Encounter Info:   Billing Info,   History,   Allergies,   Detailed Report     All Notes   Procedures by Lorretta Harp, MD at 04/26/2017 9:34 AM   Author: Lorretta Harp, MD Author Type: Physician Filed: 04/26/2017 9:34 AM  Note Status: Signed Cosign: Cosign Not Required Encounter Date: 04/21/2017  Editor: Weyman Pedro        Scan on 04/26/2017 9:34 AM by Natale Milch  Progress Notes by Lelon Perla, MD at 04/21/2017 9:45 AM   Author: Lelon Perla, MD Author Type: Physician Filed: 04/21/2017 11:59 AM  Note Status: Signed Cosign: Cosign Not Required Encounter Date: 04/21/2017  Editor: Lelon Perla, MD (Physician)       Referring-Sethi, Lucy [place], MD Reason for referral-CVA  HPI: 51 yo female for evaluation of CVA at request of Dr Antony Contras. Pt recently admitted with neck pain; CT showed clot in left carotid bifurcation. MRI showed left MCA infarct. Echo 3/18 showed normal LV function. Carotid dopplers 3/18 showed 1-39 right and 80-99 left stenosis. Had L CEA 3/18. Thrombus was felt to be embolic. 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 Arteriogram revealed 50 at left CEA site and associated filling defects possibly related to clot. Treated with ASA and plavix. Cardiology now asked to evaluate for TEE. Patient denies dyspnea, chest pain, palpitations or syncope. No history of dysphagia.        Current Outpatient  Prescriptions  Medication Sig Dispense Refill  . amitriptyline (ELAVIL) 25 MG tablet Take 1 tablet (25 mg total) by mouth at bedtime. 30 tablet 5  . carbamazepine (TEGRETOL) 200 MG tablet Take 200 mg by mouth 3 (three) times daily.    . clopidogrel (PLAVIX) 75 MG tablet Take 1 tablet (75 mg total) by mouth daily. 30 tablet 2   No current facility-administered medications for this visit.          Allergies  Allergen Reactions  . Penicillins Hives and Other (See Comments)    Nightmares Has patient had a PCN reaction causing immediate rash, facial/tongue/throat swelling, SOB or lightheadedness with hypotension: Yes Has patient had a PCN reaction causing severe rash involving mucus membranes or skin necrosis: No Has patient had a PCN reaction that required hospitalization pt was in the hospital at time of reaction Has patient had a PCN reaction occurring within the last 10 years: No If all of the above answers are "NO", then may proceed with Cephalosporin use.  . Fish Allergy Itching and Swelling    Lymph node swelling  . Other Itching and Swelling    Reaction to blue cheese Lymph node swelling  . Shrimp [Shellfish Allergy] Itching and Swelling    Lymph node swelling  . Yeast-Related Products Itching and Swelling    Lymph node swelling  Past Medical History:  Diagnosis Date  . Abnormal Pap smear of cervix    age 34  . Anxiety   . Carotid arterial disease ([place])   . Headache   . Refusal of blood transfusions as patient is Jehovah's Witness   . Stroke [facility])          Past Surgical History:  Procedure Laterality Date  . ENDARTERECTOMY Left 02/24/2017   Procedure: ENDARTERECTOMY CAROTID with removal of thrombus ;  Surgeon: Rosetta Posner, MD;  Location: [place];  Service: Vascular;  Laterality: Left;  . GYNECOLOGIC CRYOSURGERY     age 47  . IR GENERIC HISTORICAL  02/25/2017   IR ANGIO VERTEBRAL SEL SUBCLAVIAN INNOMINATE UNI R MOD SED 02/25/2017  [name], MD [facility]-INTERV RAD  . IR GENERIC HISTORICAL  02/25/2017   IR ANGIO INTRA EXTRACRAN SEL COM CAROTID INNOMINATE BILAT MOD SED 02/25/2017 [name], MD [facility]INTER[facility]  . OOPHORECTOMY      Social History        Social History  . Marital status: Divorced    Spouse name: N/A  . Number of children: 3  . Years of education: N/A      Occupational History  . Not on file.         Social History Main Topics  . Smoking status: Former Smoker    Types: Cigarettes    Quit date: 09/2016  . Smokeless tobacco: Never Used     Comment: very rare, social  . Alcohol use 1.2 oz/week    2 Cans of beer per week     Comment: social  . Drug use: No  . Sexual activity: Yes    Birth control/ protection: Condom       Other Topics Concern  . Not on file      Social History Narrative  . No narrative on file         Family History  Problem Relation Age of Onset  . Hypertension Other   . Diabetes Other   . Diabetes Maternal Grandmother   . Hypertension Maternal Grandmother   . Stroke Paternal Grandfather     ROS: no fevers or chills, productive cough, hemoptysis, dysphasia, odynophagia, melena, hematochezia, dysuria, hematuria, rash, seizure activity, orthopnea, PND, pedal edema, claudication. Remaining systems are negative.  Physical Exam:   Blood pressure 138/89, pulse 86, height 4\' 11"  (1.499 m), weight 60.7 kg (133 lb 12.8 oz).  General:  Well developed/well nourished in NAD Skin warm/dry Patient not depressed No peripheral clubbing Back-normal HEENT-normal/normal eyelids Neck supple/normal carotid upstroke bilaterally; no bruits; no JVD; no thyromegaly status post left carotid endarterectomy,  chest - CTA/ normal expansion CV - RRR/normal S1 and S2; no murmurs, rubs or gallops;  PMI nondisplaced Abdomen -NT/ND, no HSM, no mass, + bowel sounds, no bruit 2+ femoral pulses, no bruits Ext-no edema, chords, 2+  DP Neuro-mild dysarthria but otherwise unremarkable.   ECG - sinus rhythm at a rate of 86. No significant ST changes. personally reviewed  A/P  1 Recent CVA-there is concern that this could have been embolic. We will arrange a transesophageal echocardiogram to rule out cardiac source of embolus including patent foramen ovale. Continue Plavix. Otherwise management per neurology. Dr Leonie Man has also arranged a hypercoagulable workup.   2 status post carotid endarterectomy-continue Plavix. Management per vascular surgery.  Kirk Ruths, MD     For TEE; no changes. Kirk Ruths, MD

## 2017-05-04 NOTE — CV Procedure (Signed)
    Transesophageal Echocardiogram Note  Ashley Beard [id number] 04-May-1966  Procedure: Transesophageal Echocardiogram Indications: Stroke  Procedure Details Consent: Obtained Time Out: Verified patient identification, verified procedure, site/side was marked, verified correct patient position, special equipment/implants available, Radiology Safety Procedures followed,  medications/allergies/relevent history reviewed, required imaging and test results available.  Performed  Medications:  During this procedure the patient is administered a total of Versed 6 mg and Fentanyl 50 mcg  to achieve and maintain moderate conscious sedation.  The patient's heart rate, blood pressure, and oxygen saturation are monitored continuously during the procedure. The period of conscious sedation is 30 minutes, of which I was present face-to-face 100% of this time.  Normal LV function; no LAA thrombus; negative saline microcavitation study. Full report to follow  Complications: No apparent complications Patient did tolerate procedure well.  Kirk Ruths, MD

## 2017-05-05 ENCOUNTER — Encounter (HOSPITAL_COMMUNITY): Payer: Self-pay | Admitting: Cardiology

## 2017-05-10 DIAGNOSIS — R479 Unspecified speech disturbances: Secondary | ICD-10-CM | POA: Diagnosis not present

## 2017-05-10 DIAGNOSIS — G3184 Mild cognitive impairment, so stated: Secondary | ICD-10-CM | POA: Diagnosis not present

## 2017-05-10 DIAGNOSIS — I6330 Cerebral infarction due to thrombosis of unspecified cerebral artery: Secondary | ICD-10-CM | POA: Diagnosis not present

## 2017-05-10 NOTE — Telephone Encounter (Signed)
Rn call patient about scheduling an appt to return to work. Rn stated Dr. Leonie Man had a 400pm open this week that was cancel. PT stated her PCP evaluated her and look at her vision. Her PCP cleared her for work full time. PT stated she will be starting tomorrow full time. Pt will see Dr. Leonie Man on 06/16/2017 for follow up. Rn stated a message will be sent to Dr. Leonie Man. Pt verbalized understanding.

## 2017-05-10 NOTE — Telephone Encounter (Signed)
Ashley Beard to put the patient into a walk-in slot this week prior to June 7

## 2017-05-10 NOTE — Telephone Encounter (Signed)
Agree with plan 

## 2017-05-10 NOTE — Telephone Encounter (Signed)
FYI Dr. Leonie Man read below thanks.

## 2017-05-10 NOTE — Anesthesia Postprocedure Evaluation (Signed)
Anesthesia Post Note  Patient: Ashley Beard  Procedure(s) Performed: Procedure(s) (LRB): ENDARTERECTOMY CAROTID with removal of thrombus  (Left)     Anesthesia Post Evaluation  Last Vitals:  Vitals:   03/01/17 2005 03/02/17 0359  BP: (!) 144/83 119/82  Pulse: 83 78  Resp: 18 18  Temp:  36.7 C    Last Pain:  Vitals:   03/02/17 1000  TempSrc:   PainSc: 0-No pain                 Delvon Chipps S

## 2017-05-10 NOTE — Addendum Note (Signed)
Addendum  created 05/10/17 1232 by Johann Santone, MD   Sign clinical note    

## 2017-05-27 ENCOUNTER — Encounter: Payer: Self-pay | Admitting: Physician Assistant

## 2017-05-27 ENCOUNTER — Other Ambulatory Visit: Payer: Self-pay

## 2017-05-27 MED ORDER — CLOPIDOGREL BISULFATE 75 MG PO TABS: 75.0000 mg | ORAL_TABLET | Freq: Every day | ORAL | 2 refills | Status: DC

## 2017-06-07 ENCOUNTER — Encounter: Payer: Self-pay | Admitting: Physician Assistant

## 2017-06-08 ENCOUNTER — Ambulatory Visit (INDEPENDENT_AMBULATORY_CARE_PROVIDER_SITE_OTHER): Payer: BLUE CROSS/BLUE SHIELD | Admitting: Sports Medicine

## 2017-06-08 VITALS — BP 147/89 | HR 87 | Ht 59.0 in | Wt 135.2 lb

## 2017-06-08 DIAGNOSIS — R030 Elevated blood-pressure reading, without diagnosis of hypertension: Secondary | ICD-10-CM

## 2017-06-08 DIAGNOSIS — I10 Essential (primary) hypertension: Secondary | ICD-10-CM

## 2017-06-08 MED ORDER — VALSARTAN-HYDROCHLOROTHIAZIDE 320-25 MG PO TABS
0.5000 | ORAL_TABLET | Freq: Every day | ORAL | 3 refills | Status: DC
Start: 2017-06-08 — End: 2018-06-06

## 2017-06-08 NOTE — Progress Notes (Signed)
Pt informed of results. Pt expressed understanding and is agreeable. Yailyn Strack CMA, RT 

## 2017-06-08 NOTE — Progress Notes (Signed)
   Subjective:    Patient ID: Ashley Beard, female    DOB: 1966-01-15, 51 y.o.   MRN: [id number]  HPI Pt is here for a BP check. Pt denies chest pain, shortness of breath, palpitations, or any problems with medication. Pt states she has been having headaches regularly.   Review of Systems     Objective:   Physical Exam        Assessment & Plan:  Pt advised to schedule a nurse visit in 2 weeks for BP recheck.

## 2017-06-08 NOTE — Assessment & Plan Note (Signed)
Persistently elevated blood pressure, history of strokes, we will treat aggressively. Starting half dose valsartan/HCTZ. Combination ACE or ARB and thiazide diuretics action better outcomes in females with strokes than ACE/ARB or diuretic alone. Return in 2 weeks with for a blood pressure check.

## 2017-06-11 ENCOUNTER — Encounter: Payer: Self-pay | Admitting: Physician Assistant

## 2017-06-22 ENCOUNTER — Ambulatory Visit (INDEPENDENT_AMBULATORY_CARE_PROVIDER_SITE_OTHER): Payer: BLUE CROSS/BLUE SHIELD | Admitting: Physician Assistant

## 2017-06-22 ENCOUNTER — Encounter: Payer: Self-pay | Admitting: Neurology

## 2017-06-22 ENCOUNTER — Encounter: Payer: Self-pay | Admitting: Physician Assistant

## 2017-06-22 ENCOUNTER — Ambulatory Visit (INDEPENDENT_AMBULATORY_CARE_PROVIDER_SITE_OTHER): Payer: BLUE CROSS/BLUE SHIELD | Admitting: Neurology

## 2017-06-22 VITALS — BP 117/82 | HR 79 | Wt 133.4 lb

## 2017-06-22 VITALS — BP 106/65 | HR 81

## 2017-06-22 DIAGNOSIS — I10 Essential (primary) hypertension: Secondary | ICD-10-CM

## 2017-06-22 DIAGNOSIS — G500 Trigeminal neuralgia: Secondary | ICD-10-CM

## 2017-06-22 HISTORY — DX: Trigeminal neuralgia: G50.0

## 2017-06-22 NOTE — Patient Instructions (Signed)
I had a long discussion the patient with regards to symptoms of trigeminal neuralgia which appears to be in remission and does respond well to Tegretol when she takes it as needed. Continue Tegretol at the present dose of 200 mg as needed and no need to take it on a daily basis. Continue Plavix for stroke prevention with strict control of hypertension with blood pressure goal below 130/90, lipids with LDL cholesterol goal below 70 mg percent and diabetes with hemoglobin A1c goal below 6.5%. I encouraged her to eat a healthy diet and exercise regularly. She will return for follow-up in the future only if necessary. No routine scheduled follow-up appointment was made   Trigeminal Neuralgia Trigeminal neuralgia is a nerve disorder that causes attacks of severe facial pain. The attacks last from a few seconds to several minutes. They can happen for days, weeks, or months and then go away for months or years. Trigeminal neuralgia is also called tic douloureux. What are the causes? This condition is caused by damage to a nerve in the face that is called the trigeminal nerve. An attack can be triggered by:  Talking.  Chewing.  Putting on makeup.  Washing your face.  Shaving your face.  Brushing your teeth.  Touching your face.  What increases the risk? This condition is more likely to develop in:  Women.  People who are 50 years of age or older.  What are the signs or symptoms? The main symptom of this condition is pain in the jaw, lips, eyes, nose, scalp, forehead, and face. The pain may be intense, stabbing, electric, or shock-like. How is this diagnosed? This condition is diagnosed with a physical exam. A CT scan or MRI may be done to rule out other conditions that can cause facial pain. How is this treated? This condition may be treated with:  Avoiding the things that trigger your attacks.  Pain medicine.  Surgery. This may be done in severe cases if other medical treatment does  not provide relief.  Follow these instructions at home:  Take over-the-counter and prescription medicines only as told by your health care provider.  If you wish to get pregnant, talk with your health care provider before you start trying to get pregnant.  Avoid the things that trigger your attacks. It may help to: ? Chew on the unaffected side of your mouth. ? Avoid touching your face. ? Avoid blasts of hot or cold air. Contact a health care provider if:  Your pain medicine is not helping.  You develop new, unexplained symptoms, such as: ? Double vision. ? Facial weakness. ? Changes in hearing or balance.  You become pregnant. Get help right away if:  Your pain is unbearable, and your pain medicine does not help. This information is not intended to replace advice given to you by your health care provider. Make sure you discuss any questions you have with your health care provider. Document Released: 11/20/2000 Document Revised: 07/26/2016 Document Reviewed: 03/18/2015 Elsevier Interactive Patient Education  [employer].

## 2017-06-22 NOTE — Progress Notes (Signed)
Pt presents to clinic for a blood pressure check.  Denies headache, dizziness, and blurred vision.  BP was 106/65.  She stated that she takes her diovan-hct every morning. Pt reports no side effects concerning medication. Will route to PCP for review. -EH/RMA

## 2017-06-22 NOTE — Progress Notes (Signed)
[facility] [address] [place]. [place] [address] [phone]       OFFICE FOLLOW-UP NOTE  [name] Date of Birth:  04-10-1966 Medical Record Number:  [id number]   HPI:  [name]  Is a pleasant 51 year lady seen today for first office follow-up visit following hospital admission for carotid surgery and stroke in March 2018. History is obtained from the patient, her husband as well as review of electronic medical records and have personally reviewed imaging films.Ashley Beard is a 51 y.o. 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 She is otherwise healthy 51 year old female. She has had a several month history of discomfort and a burning sensation with swallowing. Also had was felt to be a sinus infection and was treated for this. This is failed to resolve and she saw her primary care physician today for continued evaluation. Patient underwent CT scan as an outpatient for this. The scan did not show any cause for the swallowing difficulty but did show a finding of a large clot located in her left carotid bifurcation with extension into her left external carotid artery.Dr Donnetta Hutching was consulted and suggested the patient be sent to [facility] for admission anticoagulation and further workup and surgery. . She did not have any history of other clotting disorders. Her only past medical history was significant for anxiety.   On further questioning regarding any neurologic deficits, he does relate a event approximately 2-3 weeks ago where she had limp and numbness in her right arm. This lasted for several minutes and then not completely resolved. Sheidid not have any speech difficulties and no right leg difficulty. She was been completely neurologically intact otherwise. She has no history of cardiac irregularity. There is no history of deep vein thrombosis or pulmonary embolism. Patient had elective  left carotid endarterectomy on 02/24/17 which was uneventful  and showed large clot in the left common carotid artery without significant underlying plaque or stenosis but postprocedure was noted to have aphasia as well as right-sided vision defect and right hemi-neglect. CT scan of the head up in the next day showed subacute left hemispheric infarcts. She underwent emergent cerebral catheter angiogram performed by Dr. Estanislado Pandy which showed approximately 50% stenosis at the distal aspect of the left carotid endarterectomy suture site with 2-3 small filling defects probably representing clots. There was no intracranial stenosis or occlusion noted. MRI scan of the brain showed patchy left frontal and parietal as well as left occipital embolic infarcts with small petechial hemorrhage. 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 Carotid ultrasound showed no significant right ICA and 80-99% left ICA stenosis. However catheter angiogram showed only 50% stenosis. Transthoracic echo showed 60-65% cardiac ejection fraction. Area cholesterol was 62 mg percent. Hemoglobin A1c was 4.9. Hypercarbia panel labs were not sent. Patient was initially on heparin and subsequently switched to aspirin and Plavix. She states she's done well since discharge her speech is improved though she still has some word finding difficulties and speech has patient. Right-sided vision loss is also improving but she still has persistent deficits. She has mild dull headaches which are constant and occasionally sharp but not disabling. She also has a toothache and has not been taking any medications for this. Patient is currently participating in outpatient speech therapy in [facility]. She complains of mild short-term memory and cognitive difficulties but is able to manage  her own affairs. She has seen Dr. Donnetta Hutching and plans to follow-up carotid ultrasound done in 6 months. She is tolerating   Plavix without bruising or bleeding. Her  blood pressure is fine today and 119/84 Update 06/22/17 : She returns for follow-up after last visit 3 months ago. She states she is doing very well. She's had no recurrent stroke or TIA symptoms. She was seen in [facility] emergency room on 04/06/17 with symptoms of transient neurologic pain on the left side of the face. She diagnosed with trigeminal neuralgia and started Tegretol. She stated did help. No the pain has resolved and rarely occurs. She is takes Tegretol 200 mg only as needed. She's also been taking Elavil 25 mg at night which helps her sleep. She is having regular blood work checked by primary physician. She did undergo transesophageal echocardiogram on 05/04/17 which was normal and no definite cardiac source of embolism was found. She states she had no lasting neurological deficits. She is tolerating Plavix well without bruising or bleeding. Blood pressure is well controlled. She has no new neurological complaints today ROS:   14 system review of systems is positive for  no complaints and all other systems negative  PMH:  Past Medical History:  Diagnosis Date  . Abnormal Pap smear of cervix    age 51  . Anxiety   . Carotid arterial disease ([place])   . Headache   . Refusal of blood transfusions as patient is Jehovah's Witness   . Stroke [facility])     Social History:  Social History   Social History  . Marital status: Divorced    Spouse name: N/A  . Number of children: 3  . Years of education: N/A   Occupational History  . Not on file.   Social History Main Topics  . Smoking status: Former Smoker    Types: Cigarettes    Quit date: 09/2016  . Smokeless tobacco: Never Used     Comment: very rare, social  . Alcohol use 1.2 oz/week    2 Cans of beer per week     Comment: social  . Drug use: No  . Sexual activity: Yes    Birth control/ protection: Condom   Other Topics Concern  . Not on file   Social History Narrative  . No narrative on file    Medications:   Current  Outpatient Prescriptions on File Prior to Visit  Medication Sig Dispense Refill  . amitriptyline (ELAVIL) 25 MG tablet Take 25 mg by mouth at bedtime.    . carbamazepine (TEGRETOL) 200 MG tablet Take 200 mg by mouth as needed.     . clopidogrel (PLAVIX) 75 MG tablet Take 1 tablet (75 mg total) by mouth daily. 30 tablet 2  . valsartan-hydrochlorothiazide (DIOVAN-HCT) 320-25 MG tablet Take 0.5 tablets by mouth daily. 30 tablet 3   No current facility-administered medications on file prior to visit.     Allergies:   Allergies  Allergen Reactions  . Penicillins Hives and Other (See Comments)    Nightmares Has patient had a PCN reaction causing immediate rash, facial/tongue/throat swelling, SOB or lightheadedness with hypotension: Yes Has patient had a PCN reaction causing severe rash involving mucus membranes or skin necrosis: No Has patient had a PCN reaction that required hospitalization pt was in the hospital at time of reaction Has patient had a PCN reaction occurring within the last 10 years: No If all of the above answers are "NO", then may proceed with Cephalosporin use.  Marland Kitchen  Fish Allergy Itching and Swelling    Lymph node swelling  . Other Itching and Swelling    Reaction to blue cheese Lymph node swelling  . Shrimp [Shellfish Allergy] Itching and Swelling    Lymph node swelling  . Yeast-Related Products Itching and Swelling    Lymph node swelling    Physical Exam General: Frail middle-aged lady seated, in no evident distress Head: head normocephalic and atraumatic.  Neck: supple with no carotid or supraclavicular bruits Cardiovascular: regular rate and rhythm, no murmurs Musculoskeletal: no deformity Skin:  no rash/petichiae. Left CEA scar in the neck Vascular:  Normal pulses all extremities Vitals:   06/22/17 1120  BP: 117/82  Pulse: 79   Neurologic Exam Mental Status: Awake and fully alert. Oriented to place and time. Recent and remote memory intact. Attention span,  concentration and fund of knowledge appropriate. Mood and affect appropriate. Speech is slightly hesitant but no significant aphasia. Good naming comprehension and a petition. Able to name 10 animals with 4 legs Cranial Nerves: Fundoscopic exam not done. Pupils equal, briskly reactive to light. Extraocular movements full without nystagmus. Visual fields  show partial right homonymous hemianopsia to confrontation. Hearing intact. Facial sensation intact. Face, tongue, palate moves normally and symmetrically.  Motor: Normal bulk and tone. Normal strength in all tested extremity muscles. Sensory.: intact to touch ,pinprick .position and vibratory sensation.  Coordination: Rapid alternating movements normal in all extremities. Finger-to-nose and heel-to-shin performed accurately bilaterally. Gait and Station: Arises from chair without difficulty. Stance is normal. Gait demonstrates normal stride length and balance . Able to heel, toe and tandem walk without difficulty.  Reflexes: 1+ and symmetric. Toes downgoing.   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 Continue Tegretol at the present dose of 200 mg as needed and no need to take it on a daily basis. Continue Plavix for stroke prevention with strict control of hypertension with blood pressure goal below 130/90, lipids with LDL cholesterol goal below 70 mg percent and diabetes with hemoglobin A1c goal below 6.5%. I encouraged her to eat a healthy diet and exercise  regularly. Follow up with vascular surgeon.  For post carotid surgery follow-up. She will return for follow-up in the future only if necessary. No routine scheduled follow-up appointment was made Greater than 50% of time during this 30 minute visit was spent on counseling,explanation of diagnosis of trigeminal neuralgia, stroke, planning of further management, discussion with patient and family and coordination of care Antony Contras, MD  [facility] [address] [place], [place] [address]  Phone [phone] Fax [phone] Note: This document was prepared with digital dictation and possible smart phrase technology. Any transcriptional errors that result from this process are unintentional

## 2017-06-23 ENCOUNTER — Ambulatory Visit: Payer: BLUE CROSS/BLUE SHIELD

## 2017-09-08 ENCOUNTER — Other Ambulatory Visit: Payer: Self-pay | Admitting: Physician Assistant

## 2017-09-14 ENCOUNTER — Ambulatory Visit (INDEPENDENT_AMBULATORY_CARE_PROVIDER_SITE_OTHER): Payer: BLUE CROSS/BLUE SHIELD | Admitting: Vascular Surgery

## 2017-09-14 ENCOUNTER — Ambulatory Visit (HOSPITAL_COMMUNITY)
Admission: RE | Admit: 2017-09-14 | Discharge: 2017-09-14 | Disposition: A | Discharge status: Home / Self Care | Payer: BLUE CROSS/BLUE SHIELD | Source: Ambulatory Visit | Attending: Vascular Surgery | Admitting: Vascular Surgery

## 2017-09-14 ENCOUNTER — Other Ambulatory Visit: Payer: Self-pay | Admitting: Physician Assistant

## 2017-09-14 ENCOUNTER — Encounter: Payer: Self-pay | Admitting: Vascular Surgery

## 2017-09-14 VITALS — BP 144/96 | HR 76 | Temp 97.0°F | Resp 16 | Ht 59.0 in | Wt 134.0 lb

## 2017-09-14 DIAGNOSIS — I6521 Occlusion and stenosis of right carotid artery: Secondary | ICD-10-CM | POA: Insufficient documentation

## 2017-09-14 DIAGNOSIS — Z9889 Other specified postprocedural states: Secondary | ICD-10-CM | POA: Insufficient documentation

## 2017-09-14 DIAGNOSIS — I6522 Occlusion and stenosis of left carotid artery: Secondary | ICD-10-CM

## 2017-09-14 DIAGNOSIS — Z1231 Encounter for screening mammogram for malignant neoplasm of breast: Secondary | ICD-10-CM | POA: Diagnosis not present

## 2017-09-14 LAB — VAS US CAROTID
LEFT ECA DIAS: -15 cm/s
LEFT VERTEBRAL DIAS: 9 cm/s
LICAPDIAS: -21 cm/s
LICAPSYS: -57 cm/s
Left CCA dist dias: -17 cm/s
Left CCA dist sys: -56 cm/s
Left CCA prox dias: 23 cm/s
Left CCA prox sys: 76 cm/s
Left ICA dist dias: -37 cm/s
Left ICA dist sys: -79 cm/s
RCCAPDIAS: -13 cm/s
RCCAPSYS: -60 cm/s
RIGHT CCA MID DIAS: -24 cm/s
RIGHT ECA DIAS: -14 cm/s
RIGHT VERTEBRAL DIAS: 12 cm/s
Right cca dist sys: -94 cm/s

## 2017-09-14 LAB — HM MAMMOGRAPHY

## 2017-09-14 NOTE — Progress Notes (Signed)
Vascular and Vein Specialist of [place]  Patient name: Ashley Beard MRN: [id number] DOB: August 16, 1966 Sex: female  REASON FOR VISIT: Follow-up left carotid endarterectomy  HPI: Ashley Beard is a 51 y.o. female here for follow-up. She had presented with very unusual complex of neck pain and difficulty swallowing. CT showed what was presumed to be an incidental finding of high-grade stenosis which appeared to be thrombus in her carotid bifurcation. She underwent endarterectomy and removal of this. Did suffer postoperative stroke and has had complete resolution. She is here today for follow-up. She has returned to her baseline. Has returned her preoperative work. She reports no difficulty with speech and no difficulty with walking or with clumsiness in her right hand.  Past Medical History:  Diagnosis Date  . Abnormal Pap smear of cervix    age 83  . Anxiety   . Carotid arterial disease ([place])   . Headache   . Refusal of blood transfusions as patient is Jehovah's Witness   . Stroke ([place])   . Trigeminal neuralgia 06/22/2017   Dx by neuro. Takes Tegretol prn    Family History  Problem Relation Age of Onset  . Hypertension Other   . Diabetes Other   . Diabetes Maternal Grandmother   . Hypertension Maternal Grandmother   . Stroke Paternal Grandfather     SOCIAL HISTORY: Social History  Substance Use Topics  . Smoking status: Former Smoker    Types: Cigarettes    Quit date: 09/2016  . Smokeless tobacco: Never Used     Comment: very rare, social  . Alcohol use 1.2 oz/week    2 Cans of beer per week     Comment: social    Allergies  Allergen Reactions  . Penicillins Hives and Other (See Comments)    Nightmares Has patient had a PCN reaction causing immediate rash, facial/tongue/throat swelling, SOB or lightheadedness with hypotension: Yes Has patient had a PCN reaction causing severe rash involving mucus membranes or skin necrosis: No Has  patient had a PCN reaction that required hospitalization pt was in the hospital at time of reaction Has patient had a PCN reaction occurring within the last 10 years: No If all of the above answers are "NO", then may proceed with Cephalosporin use.  . Fish Allergy Itching and Swelling    Lymph node swelling  . Other Itching and Swelling    Reaction to blue cheese Lymph node swelling  . Shrimp [Shellfish Allergy] Itching and Swelling    Lymph node swelling  . Yeast-Related Products Itching and Swelling    Lymph node swelling    Current Outpatient Prescriptions  Medication Sig Dispense Refill  . amitriptyline (ELAVIL) 25 MG tablet Take 25 mg by mouth at bedtime.    . carbamazepine (TEGRETOL) 200 MG tablet Take 200 mg by mouth as needed.     . clopidogrel (PLAVIX) 75 MG tablet TAKE 1 TABLET BY MOUTH DAILY 30 tablet 0  . valsartan-hydrochlorothiazide (DIOVAN-HCT) 320-25 MG tablet Take 0.5 tablets by mouth daily. 30 tablet 3   No current facility-administered medications for this visit.     REVIEW OF SYSTEMS:  [X]  denotes positive finding, [ ]  denotes negative finding Cardiac  Comments:  Chest pain or chest pressure:    Shortness of breath upon exertion:    Short of breath when lying flat:    Irregular heart rhythm:        Vascular    Pain in calf, thigh, or hip brought on by ambulation:  Pain in feet at night that wakes you up from your sleep:     Blood clot in your veins:    Leg swelling:           PHYSICAL EXAM: Vitals:   09/14/17 0840 09/14/17 0842 09/14/17 0843  BP: (!) 147/86 (!) 144/93 (!) 144/96  Pulse: 76 76 76  Resp: 16    Temp: (!) 97 F (36.1 C)    SpO2: 100%    Weight: 134 lb (60.8 kg)    Height: 4\' 11"  (1.499 m)      GENERAL: The patient is a well-nourished female, in no acute distress. The vital signs are documented above. CARDIOVASCULAR: Left neck incision is well-healed with no bruit present. 2+ radial pulses bilaterally PULMONARY: There is good  air exchange  MUSCULOSKELETAL: There are no major deformities or cyanosis. NEUROLOGIC: No focal weakness or paresthesias are detected. SKIN: There are no ulcers or rashes noted. PSYCHIATRIC: The patient has a normal affect.  DATA:  Carotid duplex today reveals widely patent endarterectomy with no evidence recurrent stenosis and no evidence of stenosis in her right carotid  MEDICAL ISSUES: Stable overall. I will continue full activities with no limitation. We'll see [place] again in 6 months with repeat carotid duplex. Assuming this is normal we'll drop back to yearly carotid duplex surveillance    Rosetta Posner, MD [facility] V[facility] and Vein Specialists of [facility] Tel [phone] Pager [phone]

## 2017-09-14 NOTE — Progress Notes (Signed)
Vitals:   09/14/17 0840 09/14/17 0842  BP: (!) 147/86 (!) 144/93  Pulse: 76 76  Resp: 16   Temp: (!) 97 F (36.1 C)   SpO2: 100%   Weight: 134 lb (60.8 kg)   Height: 4\' 11"  (1.499 m)

## 2017-09-21 ENCOUNTER — Encounter: Payer: Self-pay | Admitting: Vascular Surgery

## 2017-09-30 DIAGNOSIS — R928 Other abnormal and inconclusive findings on diagnostic imaging of breast: Secondary | ICD-10-CM | POA: Diagnosis not present

## 2017-09-30 DIAGNOSIS — R921 Mammographic calcification found on diagnostic imaging of breast: Secondary | ICD-10-CM | POA: Diagnosis not present

## 2017-09-30 LAB — HM MAMMOGRAPHY

## 2017-10-06 ENCOUNTER — Encounter: Payer: Self-pay | Admitting: Physician Assistant

## 2017-10-06 DIAGNOSIS — R928 Other abnormal and inconclusive findings on diagnostic imaging of breast: Secondary | ICD-10-CM | POA: Insufficient documentation

## 2017-10-07 ENCOUNTER — Telehealth: Payer: Self-pay

## 2017-10-07 NOTE — Telephone Encounter (Signed)
-----   Message from [facility], [place] sent at 10/06/2017 11:45 AM EDT ----- Let Blanch Media know I received her mammogram results, which showed an abnormality in the left breast. I would like to know if she has been contacted by the [facility] and had her additional images yet

## 2017-10-07 NOTE — Telephone Encounter (Signed)
Left vm for pt to return call to clinic -EH/RMA  

## 2017-10-21 ENCOUNTER — Other Ambulatory Visit: Payer: Self-pay | Admitting: Physician Assistant

## 2017-11-03 NOTE — Addendum Note (Signed)
Addended by: Lianne Cure A on: 11/03/2017 03:51 PM   Modules accepted: Orders

## 2017-11-08 ENCOUNTER — Telehealth: Payer: Self-pay | Admitting: Physician Assistant

## 2017-11-08 MED ORDER — CLOPIDOGREL BISULFATE 75 MG PO TABS: 75.0000 mg | ORAL_TABLET | Freq: Every day | ORAL | 0 refills | Status: DC

## 2017-11-08 NOTE — Telephone Encounter (Signed)
Medication refilled. Put up front to call patient to schedule a follow up appointment.

## 2017-11-08 NOTE — Telephone Encounter (Signed)
Patient called request to get Plavix Authorized she has one pill left. Pt also request to know if there is a way that this medicine doesn't not need an authorization because she will have to take this for the rest of her life due to having a stroke. Adv pt I am unsure will send a phone message. Thanks

## 2017-12-14 ENCOUNTER — Other Ambulatory Visit: Payer: Self-pay | Admitting: Physician Assistant

## 2017-12-14 ENCOUNTER — Telehealth: Payer: Self-pay

## 2017-12-14 NOTE — Telephone Encounter (Signed)
I called Dr. Clydene Fake office and spoke with his nurse Katrina and she states he informed pt to follow up as needed and to continue with primary care and seeing her cardiology. She stated the plavix is for long term.

## 2017-12-14 NOTE — Telephone Encounter (Signed)
Rn receive incoming call from [place] from [place] [facility] pts pcp office. She wanted to ask about the plavix medication. Jasmine stated our office started her on plavix. Rn stated the pt has been on plavix since 03/2017. Pt is seeing vascular md and cardiology.Jasmine ask when is the pts next follow up. Rn stated per Dr. Leonie Man note pt is to continue the plavix long term per Dr.Sethi note. Rn stated the pt has progress well since her stroke. Rn stated at pts last visit in July 2018 pt is to follow up as needed. [place] verbalized understanding.

## 2017-12-14 NOTE — Telephone Encounter (Signed)
Please call Dr. Clydene Fake office (neurology), to find out 1. When they want to see her for follow-up and 2. How long they plan to keep her on Plavix  It looks like Ashley Beard filled this Rx for the patient, but I was not the original prescriber. I want to be make sure she is not lost to follow-up with her specialists and that the Plavix is prescribed for the appropriate duration.   If they are keeping her on Plavix for life, I am okay with taking over the prescription for her.   Thanks!

## 2017-12-17 ENCOUNTER — Encounter: Payer: Self-pay | Admitting: Physician Assistant

## 2017-12-17 ENCOUNTER — Telehealth: Payer: Self-pay | Admitting: Physician Assistant

## 2017-12-17 NOTE — Telephone Encounter (Signed)
I called patient and left a VM that she is due for a Follow up appt with Nelson Chimes and for her to call our office to schedule

## 2017-12-29 ENCOUNTER — Other Ambulatory Visit: Payer: Self-pay | Admitting: Physician Assistant

## 2017-12-29 DIAGNOSIS — G4452 New daily persistent headache (NDPH): Secondary | ICD-10-CM

## 2018-01-27 DIAGNOSIS — Z880 Allergy status to penicillin: Secondary | ICD-10-CM | POA: Diagnosis not present

## 2018-01-27 DIAGNOSIS — G9389 Other specified disorders of brain: Secondary | ICD-10-CM | POA: Diagnosis not present

## 2018-01-27 DIAGNOSIS — Z91018 Allergy to other foods: Secondary | ICD-10-CM | POA: Diagnosis not present

## 2018-01-27 DIAGNOSIS — Z79899 Other long term (current) drug therapy: Secondary | ICD-10-CM | POA: Diagnosis not present

## 2018-01-27 DIAGNOSIS — Z91013 Allergy to seafood: Secondary | ICD-10-CM | POA: Diagnosis not present

## 2018-01-27 DIAGNOSIS — R42 Dizziness and giddiness: Secondary | ICD-10-CM | POA: Diagnosis not present

## 2018-01-27 DIAGNOSIS — I10 Essential (primary) hypertension: Secondary | ICD-10-CM | POA: Diagnosis not present

## 2018-01-27 DIAGNOSIS — Z8673 Personal history of transient ischemic attack (TIA), and cerebral infarction without residual deficits: Secondary | ICD-10-CM | POA: Diagnosis not present

## 2018-01-27 DIAGNOSIS — Z87891 Personal history of nicotine dependence: Secondary | ICD-10-CM | POA: Diagnosis not present

## 2018-01-28 ENCOUNTER — Ambulatory Visit: Payer: BLUE CROSS/BLUE SHIELD

## 2018-02-17 ENCOUNTER — Other Ambulatory Visit: Payer: Self-pay | Admitting: Physician Assistant

## 2018-02-17 DIAGNOSIS — G4452 New daily persistent headache (NDPH): Secondary | ICD-10-CM

## 2018-04-14 ENCOUNTER — Telehealth: Payer: Self-pay | Admitting: Physician Assistant

## 2018-04-14 NOTE — Telephone Encounter (Signed)
Left message that forms will be filled out and faxed -EH/RMA

## 2018-04-14 NOTE — Telephone Encounter (Signed)
Received a request from [facility] in [place] requesting records for patient with the same first name and DOB. Please ask patient if she has any aliases/prior last names Did she ever live on [address] in [place]?

## 2018-04-14 NOTE — Telephone Encounter (Signed)
Left vm for pt to return call to clinic -EH/RMA  

## 2018-04-18 ENCOUNTER — Other Ambulatory Visit: Payer: Self-pay | Admitting: Physician Assistant

## 2018-04-20 ENCOUNTER — Other Ambulatory Visit: Payer: Self-pay | Admitting: Physician Assistant

## 2018-04-20 DIAGNOSIS — G4452 New daily persistent headache (NDPH): Secondary | ICD-10-CM

## 2018-05-17 ENCOUNTER — Telehealth: Payer: Self-pay | Admitting: Physician Assistant

## 2018-05-17 DIAGNOSIS — N6320 Unspecified lump in the left breast, unspecified quadrant: Secondary | ICD-10-CM

## 2018-05-17 NOTE — Telephone Encounter (Signed)
Please let Ashley Beard know she is overdue for her follow-up diagnostic mammogram and ultrasound of her left breast I have ordered these and they will be performed at the breast center

## 2018-05-18 NOTE — Telephone Encounter (Signed)
Left VM for Pt with update  

## 2018-06-06 ENCOUNTER — Other Ambulatory Visit: Payer: Self-pay | Admitting: Sports Medicine

## 2018-06-06 ENCOUNTER — Other Ambulatory Visit: Payer: Self-pay | Admitting: Physician Assistant

## 2018-06-06 ENCOUNTER — Ambulatory Visit
Admission: RE | Admit: 2018-06-06 | Discharge: 2018-06-06 | Disposition: A | Discharge status: Home / Self Care | Payer: BLUE CROSS/BLUE SHIELD | Source: Ambulatory Visit | Attending: Physician Assistant | Admitting: Physician Assistant

## 2018-06-06 DIAGNOSIS — R928 Other abnormal and inconclusive findings on diagnostic imaging of breast: Secondary | ICD-10-CM | POA: Diagnosis not present

## 2018-06-06 DIAGNOSIS — N6320 Unspecified lump in the left breast, unspecified quadrant: Secondary | ICD-10-CM

## 2018-06-06 DIAGNOSIS — N6489 Other specified disorders of breast: Secondary | ICD-10-CM | POA: Diagnosis not present

## 2018-06-06 DIAGNOSIS — I10 Essential (primary) hypertension: Secondary | ICD-10-CM

## 2018-06-13 ENCOUNTER — Encounter: Payer: Self-pay | Admitting: Physician Assistant

## 2018-06-13 ENCOUNTER — Ambulatory Visit: Payer: BLUE CROSS/BLUE SHIELD | Admitting: Physician Assistant

## 2018-06-13 VITALS — BP 110/72 | HR 69 | Wt 142.0 lb

## 2018-06-13 DIAGNOSIS — I739 Peripheral vascular disease, unspecified: Secondary | ICD-10-CM

## 2018-06-13 DIAGNOSIS — Z8673 Personal history of transient ischemic attack (TIA), and cerebral infarction without residual deficits: Secondary | ICD-10-CM

## 2018-06-13 DIAGNOSIS — G4489 Other headache syndrome: Secondary | ICD-10-CM | POA: Diagnosis not present

## 2018-06-13 DIAGNOSIS — Z79899 Other long term (current) drug therapy: Secondary | ICD-10-CM | POA: Diagnosis not present

## 2018-06-13 DIAGNOSIS — E785 Hyperlipidemia, unspecified: Secondary | ICD-10-CM

## 2018-06-13 DIAGNOSIS — I10 Essential (primary) hypertension: Secondary | ICD-10-CM

## 2018-06-13 DIAGNOSIS — Z1211 Encounter for screening for malignant neoplasm of colon: Secondary | ICD-10-CM | POA: Diagnosis not present

## 2018-06-13 DIAGNOSIS — E876 Hypokalemia: Secondary | ICD-10-CM

## 2018-06-13 DIAGNOSIS — I779 Disorder of arteries and arterioles, unspecified: Secondary | ICD-10-CM

## 2018-06-13 MED ORDER — VALSARTAN-HYDROCHLOROTHIAZIDE 160-12.5 MG PO TABS: 1.0000 | ORAL_TABLET | Freq: Every day | ORAL | 1 refills | Status: DC

## 2018-06-13 MED ORDER — CARBAMAZEPINE 200 MG PO TABS: 200.0000 mg | ORAL_TABLET | Freq: Every day | ORAL | 1 refills | Status: DC

## 2018-06-13 MED ORDER — AMITRIPTYLINE HCL 25 MG PO TABS: 25.0000 mg | ORAL_TABLET | Freq: Every day | ORAL | 1 refills | Status: DC

## 2018-06-13 NOTE — Patient Instructions (Addendum)
For your blood pressure: - Goal <130/80 - monitor and log blood pressures at home - check around the same time each day in a relaxed setting - Limit salt to <2000 mg/day - Follow DASH eating plan - limit alcohol to 2 standard drinks per day for men and 1 per day for women - avoid tobacco products - weight loss: 7% of current body weight - follow-up every 6 months for your blood pressure   Schedule follow-up appointment with Dr. Donnetta Hutching for your carotid ultrasound

## 2018-06-13 NOTE — Progress Notes (Signed)
HPI:                                                                Ashley Beard is a 52 y.o. female who presents to [place]: [place] today for medication management  HTN: taking Valsartan-HCTZ half tablet daily. Compliant with medications. Does not check  BP's at home. Denies vision change, headache, chest pain with exertion, orthopnea, lightheadedness, syncope and edema.   Hx of CVA: post-op CVA in 2018 following carotid endarterectomy. Currently on Plavix. Followed by Vascular Q21months. Overdue for carotid US.  Headaches: began experiencing left-sided headaches/facial pain after CVA. Diagnosed by Neuro with TN, but she is no longer being followed by neurology. Ran out of tegretol and Amitryptline last month. Reports headaches have returned daily since running out of medication, but they are less intense than they used to be. Headaches are associated with photophobia. She has been on intermittent FMLA for this for the last year and needs this renewed.  No flowsheet data found.  No flowsheet data found.    Past Medical History:  Diagnosis Date  . Abnormal Pap smear of cervix    age 60  . Anxiety   . Carotid arterial disease ([place])   . Headache   . Refusal of blood transfusions as patient is Jehovah's Witness   . Stroke ([place])   . Trigeminal neuralgia 06/22/2017   Dx by neuro. Takes Tegretol prn   Past Surgical History:  Procedure Laterality Date  . ENDARTERECTOMY Left 02/24/2017   Procedure: ENDARTERECTOMY CAROTID with removal of thrombus ;  Surgeon: Rosetta Posner, MD;  Location: [place];  Service: Vascular;  Laterality: Left;  . GYNECOLOGIC CRYOSURGERY     age 23  . IR GENERIC HISTORICAL  02/25/2017   IR ANGIO VERTEBRAL SEL SUBCLAVIAN INNOMINATE UNI R MOD SED 02/25/2017 [name], MD [facility]INTER[facility] RAD  . IR GENERIC HISTORICAL  02/25/2017   IR ANGIO INTRA EXTRACRAN SEL COM CAROTID INNOMINATE BILAT MOD SED 02/25/2017 [name], MD  [facility]  . OOPHORECTOMY    . TEE WITHOUT CARDIOVERSION N/A 05/04/2017   Procedure: TRANSESOPHAGEAL ECHOCARDIOGRAM (TEE);  Surgeon: Lelon Perla, MD;  Location: [facility] ENDOSCOPY;  Service: Cardiovascular;  Laterality: N/A;   Social History   Tobacco Use  . Smoking status: Former Smoker    Types: Cigarettes    Last attempt to quit: 09/2016    Years since quitting: 1.8  . Smokeless tobacco: Never Used  . Tobacco comment: very rare, social  Substance Use Topics  . Alcohol use: Yes    Alcohol/week: 1.2 oz    Types: 2 Cans of beer per week    Comment: social   family history includes Diabetes in her maternal grandmother and other; Hypertension in her maternal grandmother and other; Stroke in her paternal grandfather.    ROS: negative except as noted in the HPI  Medications: Current Outpatient Medications  Medication Sig Dispense Refill  . amitriptyline (ELAVIL) 25 MG tablet Take 1 tablet (25 mg total) by mouth at bedtime. 90 tablet 1  . carbamazepine (TEGRETOL) 200 MG tablet Take 1 tablet (200 mg total) by mouth at bedtime. 90 tablet 1  . clopidogrel (PLAVIX) 75 MG tablet TAKE 1 TABLET(75 MG) BY MOUTH DAILY.  FOLLOW UP APPOINTMENT WITH PCP BEFORE MORE REFILLS 30 tablet 0  . valsartan-hydrochlorothiazide (DIOVAN-HCT) 160-12.5 MG tablet Take 1 tablet by mouth daily. 90 tablet 1   No current facility-administered medications for this visit.    Allergies  Allergen Reactions  . Penicillins Hives and Other (See Comments)    Nightmares Has patient had a PCN reaction causing immediate rash, facial/tongue/throat swelling, SOB or lightheadedness with hypotension: Yes Has patient had a PCN reaction causing severe rash involving mucus membranes or skin necrosis: No Has patient had a PCN reaction that required hospitalization pt was in the hospital at time of reaction Has patient had a PCN reaction occurring within the last 10 years: No If all of the above answers are "NO", then may  proceed with Cephalosporin use.  . Fish Allergy Itching and Swelling    Lymph node swelling  . Pineapple Swelling  . Wheat Bran Swelling  . Other Itching and Swelling    Reaction to blue cheese Lymph node swelling  . Shrimp [Shellfish Allergy] Itching and Swelling    Lymph node swelling  . Yeast-Related Products Itching and Swelling    Lymph node swelling       Objective:  BP 110/72   Pulse 69   Wt 142 lb (64.4 kg)   BMI 28.68 kg/m  Gen:  alert, not ill-appearing, no distress, appropriate for age 29: head normocephalic without obvious abnormality, conjunctiva and cornea clear, trachea midline, no carotid bruits Pulm: Normal work of breathing, normal phonation, clear to auscultation bilaterally, no wheezes, rales or rhonchi CV: Normal rate, regular rhythm, s1 and s2 distinct, no murmurs, clicks or rubs  Neuro: alert and oriented x 3, no tremor MSK: extremities atraumatic, normal gait and station Skin: intact, no rashes on exposed skin, no jaundice, no cyanosis Psych: well-groomed, cooperative, good eye contact, euthymic mood, affect mood-congruent, speech is articulate, and thought processes clear and goal-directed    No results found for this or any previous visit (from the past 72 hour(s)). No results found.    Assessment and Plan: 52 y.o. female with   Encounter for medication management - Plan: CBC, Comprehensive metabolic panel, Lipid Panel w/reflex Direct LDL  Hypertension goal BP (blood pressure) < 130/80 - Plan: valsartan-hydrochlorothiazide (DIOVAN-HCT) 160-12.5 MG tablet, Comprehensive metabolic panel  Colon cancer screening - referral placed to GI 06/13/18 - Plan: Ambulatory referral to Gastroenterology  Other headache syndrome - Plan: amitriptyline (ELAVIL) 25 MG tablet, carbamazepine (TEGRETOL) 200 MG tablet  Left-sided carotid artery disease, unspecified type ([place]) - Plan: Lipid Panel w/reflex Direct LDL  - Personally reviewed PMH, PSH, PFH,  medications, allergies, HM - Age-appropriate cancer screening: overdue for Pap smear, referred to GI for colonoscopy for colon cancer screening, mammogram UTD - Influenza n/a - Tdap UTD - Fasting labs pending - BP at goal, medications refilled - Instructed to schedule f/u with vascular  - Will renew intermittent FMLA, patient to drop off forms   Patient education and anticipatory guidance given Patient agrees with treatment plan Follow-up in 6 months or sooner as needed if symptoms worsen or fail to improve  Darlyne Russian PA-C

## 2018-06-17 ENCOUNTER — Telehealth: Payer: Self-pay | Admitting: Physician Assistant

## 2018-06-17 NOTE — Telephone Encounter (Signed)
FMLA forms completed for intermittent leave Please fax and contact patient

## 2018-06-20 NOTE — Telephone Encounter (Signed)
Pt notified -EH/RMA  

## 2018-06-25 ENCOUNTER — Encounter: Payer: Self-pay | Admitting: Physician Assistant

## 2018-06-25 DIAGNOSIS — Z79899 Other long term (current) drug therapy: Secondary | ICD-10-CM | POA: Insufficient documentation

## 2018-06-25 DIAGNOSIS — Z1211 Encounter for screening for malignant neoplasm of colon: Secondary | ICD-10-CM | POA: Insufficient documentation

## 2018-07-02 ENCOUNTER — Other Ambulatory Visit: Payer: Self-pay | Admitting: Physician Assistant

## 2018-07-04 ENCOUNTER — Other Ambulatory Visit: Payer: Self-pay

## 2018-07-04 MED ORDER — CLOPIDOGREL BISULFATE 75 MG PO TABS: ORAL_TABLET | ORAL | 0 refills | Status: DC

## 2018-07-11 ENCOUNTER — Encounter: Payer: Self-pay | Admitting: Gastroenterology

## 2018-07-22 ENCOUNTER — Ambulatory Visit: Payer: BLUE CROSS/BLUE SHIELD | Admitting: Gastroenterology

## 2018-08-11 ENCOUNTER — Ambulatory Visit (INDEPENDENT_AMBULATORY_CARE_PROVIDER_SITE_OTHER): Payer: BLUE CROSS/BLUE SHIELD | Admitting: Gastroenterology

## 2018-08-11 ENCOUNTER — Encounter: Payer: Self-pay | Admitting: Gastroenterology

## 2018-08-11 ENCOUNTER — Telehealth: Payer: Self-pay

## 2018-08-11 VITALS — BP 104/72 | HR 116 | Ht 59.0 in | Wt 142.4 lb

## 2018-08-11 DIAGNOSIS — Z8673 Personal history of transient ischemic attack (TIA), and cerebral infarction without residual deficits: Secondary | ICD-10-CM | POA: Diagnosis not present

## 2018-08-11 DIAGNOSIS — Z1212 Encounter for screening for malignant neoplasm of rectum: Secondary | ICD-10-CM

## 2018-08-11 DIAGNOSIS — Z7901 Long term (current) use of anticoagulants: Secondary | ICD-10-CM

## 2018-08-11 DIAGNOSIS — Z7902 Long term (current) use of antithrombotics/antiplatelets: Secondary | ICD-10-CM

## 2018-08-11 DIAGNOSIS — Z1211 Encounter for screening for malignant neoplasm of colon: Secondary | ICD-10-CM | POA: Diagnosis not present

## 2018-08-11 MED ORDER — SOD PICOSULFATE-MAG OX-CIT ACD 10-3.5-12 MG-GM -GM/160ML PO SOLN: 1.0000 | ORAL | 0 refills | Status: DC

## 2018-08-11 NOTE — Telephone Encounter (Signed)
[facility] Pre-operative Risk Assessment     Request for surgical clearance:     Endoscopy Procedure  What type of surgery is being performed?     Colonoscopy  When is this surgery scheduled?     09/30/2018 1:30pm  What type of clearance is required ?   Pharmacy  Are there any medications that need to be held prior to surgery and how long? Plavix, 5 days  Practice name and name of physician performing surgery?      [facility]  What is your office phone and fax number?      Phone- [phone]  [phone]  Anesthesia type (None, local, MAC, general) ?       MAC3

## 2018-08-11 NOTE — Progress Notes (Signed)
Chief Complaint: Colon cancer screening  Referring Provider:     Trixie Dredge PA-C   HPI:    Ashley Beard is a 52 y.o. female presenting to the Gastroenterology Clinic for initial CRC screening.  She is currently without any active GI sxs. Denies any melena, hematochezia, nausea, vomiting, diarrhea, constipation, change in bowel habits, early satiety, or abdominal pain, and no fever, chills, night sweats or weight loss. No previous CRC screening to date.   She has a history of CVA in March 2018 following left CEA and on Plavix now.  No other history of VTE. FHX notable for maternal GM with CRC >age 39. Otherwise, no FDR with CRC or related malignancies.    Past Medical History:  Diagnosis Date  . Abnormal Pap smear of cervix    age 36  . Anxiety   . Carotid arterial disease ([place])   . Headache   . Refusal of blood transfusions as patient is Jehovah's Witness   . Stroke ([place])   . Trigeminal neuralgia 06/22/2017   Dx by neuro. Takes Tegretol prn     Past Surgical History:  Procedure Laterality Date  . ENDARTERECTOMY Left 02/24/2017   Procedure: ENDARTERECTOMY CAROTID with removal of thrombus ;  Surgeon: Rosetta Posner, MD;  Location: [place];  Service: Vascular;  Laterality: Left;  . GYNECOLOGIC CRYOSURGERY     age 37  . IR GENERIC HISTORICAL  02/25/2017   IR ANGIO VERTEBRAL SEL SUBCLAVIAN INNOMINATE UNI R MOD SED 02/25/2017 [name], MD [facility]  . IR GENERIC HISTORICAL  02/25/2017   IR ANGIO INTRA EXTRACRAN SEL COM CAROTID INNOMINATE BILAT MOD SED 02/25/2017 [name], MD [facility]  . OOPHORECTOMY    . TEE WITHOUT CARDIOVERSION N/A 05/04/2017   Procedure: TRANSESOPHAGEAL ECHOCARDIOGRAM (TEE);  Surgeon: Lelon Perla, MD;  Location: [facility] ENDOSCOPY;  Service: Cardiovascular;  Laterality: N/A;   Family History  Problem Relation Age of Onset  . Diabetes Maternal Grandmother        Died of heart attack  . Hypertension Maternal  Grandmother   . Colon cancer Maternal Grandmother   . Stroke Paternal Grandfather   . Esophageal cancer Neg Hx    Social History   Tobacco Use  . Smoking status: Former Smoker    Types: Cigarettes    Last attempt to quit: 09/2016    Years since quitting: 1.9  . Smokeless tobacco: Never Used  . Tobacco comment: very rare, social  Substance Use Topics  . Alcohol use: Yes    Alcohol/week: 2.0 standard drinks    Types: 2 Cans of beer per week    Comment: social  . Drug use: No   Current Outpatient Medications  Medication Sig Dispense Refill  . amitriptyline (ELAVIL) 25 MG tablet Take 1 tablet (25 mg total) by mouth at bedtime. 90 tablet 1  . carbamazepine (TEGRETOL) 200 MG tablet Take 1 tablet (200 mg total) by mouth at bedtime. 90 tablet 1  . clopidogrel (PLAVIX) 75 MG tablet TAKE 1 TABLET(75 MG) BY MOUTH DAILY. FOLLOW UP APPOINTMENT WITH PCP BEFORE MORE REFILLS 90 tablet 0  . valsartan-hydrochlorothiazide (DIOVAN-HCT) 160-12.5 MG tablet Take 1 tablet by mouth daily. 90 tablet 1   No current facility-administered medications for this visit.    Allergies  Allergen Reactions  . Penicillins Hives and Other (See Comments)    Nightmares Has patient had a PCN reaction causing immediate rash,  facial/tongue/throat swelling, SOB or lightheadedness with hypotension: Yes Has patient had a PCN reaction causing severe rash involving mucus membranes or skin necrosis: No Has patient had a PCN reaction that required hospitalization pt was in the hospital at time of reaction Has patient had a PCN reaction occurring within the last 10 years: No If all of the above answers are "NO", then may proceed with Cephalosporin use.  . Fish Allergy Itching and Swelling    Lymph node swelling  . Pineapple Swelling  . Wheat Bran Swelling  . Other Itching and Swelling    Reaction to blue cheese Lymph node swelling  . Shrimp [Shellfish Allergy] Itching and Swelling    Lymph node swelling  . Yeast-Related  Products Itching and Swelling    Lymph node swelling     Review of Systems: All systems reviewed and negative except where noted in HPI.     Physical Exam:    Wt Readings from Last 3 Encounters:  08/11/18 142 lb 6 oz (64.6 kg)  06/13/18 142 lb (64.4 kg)  09/14/17 134 lb (60.8 kg)    BP 104/72   Pulse (!) 116   Ht '4\' 11"'$  (1.499 m)   Wt 142 lb 6 oz (64.6 kg)   BMI 28.76 kg/m  Constitutional:  Pleasant, in no acute distress. Psychiatric: Normal mood and affect. Behavior is normal. EENT: Pupils normal.  Conjunctivae are normal. No scleral icterus. Neck supple. No cervical LAD. Cardiovascular: Normal rate, regular rhythm. No edema Pulmonary/chest: Effort normal and breath sounds normal. No wheezing, rales or rhonchi. Abdominal: Soft, nondistended, nontender. Bowel sounds active throughout. There are no masses palpable. No hepatomegaly. Neurological: Alert and oriented to person place and time. Skin: Skin is warm and dry. No rashes noted.   ASSESSMENT AND PLAN;   Ashley Beard is a 52 y.o. female presenting to the Gastroenterology Clinic for initial CRC screening.  Family history only notable for maternal grandmother with colon cancer, age > 32.  No previous CRC screening to date.  1) CRC screening: Discussed options for CRC screening, to include optical vs virtual colonoscopy - the risks and benefits and pros and cons of each, as well as discussion of FIT kit testing, Cologuard, etc, and the patient decided to proceed with an optical colonoscopy.   - Will schedule date and time for colonoscopy prior to leaving clinic today  - NPO at MN prior to procedure  - Bowel prep ordered with plan for instruction with GI clinical staff - All questions answered  The indications, risks, and benefits of colonoscopy were explained to the patient in detail. Risks include but are not limited to bleeding, perforation, adverse reaction to medications, and cardiopulmonary compromise. Sequelae  include but are not limited to the possibility of surgery, hospitalization, and mortality. The patient verbalized understanding and wished to proceed. All questions answered, referred for scheduling and bowel prep ordered. Further recommendations pending results of the exam.  2) History of CVA/chronic antiplatelet therapy: Had a long discussion with the patient regarding antiplatelet therapy in the perioperative period.  Discussed options to include continuing Plavix, holding Plavix, converting Plavix to aspirin in the preoperative time, and she opted for the latter. -Hold Plavix 5 days before procedure - will instruct when and how to resume after procedure.  -ASA 81 mg daily for the 5 days that Plavix is held -Will communicate by phone or EMR with patient's prescribing provider to confirm that holding Plavix and bridging with aspirin is reasonable in this case -  Low but real risk of cardiovascular event such as heart attack, stroke, embolism, thrombosis or ischemia/infarct of other organs off Plavix explained and need to seek urgent help if this occurs. The patient consents to proceed as outlined.   Lavena Bullion, DO, FACG  08/11/2018, 2:35 PM   [place], Gretta Arab*

## 2018-08-11 NOTE — Patient Instructions (Addendum)
If you are age 51 or older, your body mass index should be between 23-30. Your Body mass index is 28.76 kg/m. If this is out of the aforementioned range listed, please consider follow up with your Primary Care Provider.  If you are age 20 or younger, your body mass index should be between 19-25. Your Body mass index is 28.76 kg/m. If this is out of the aformentioned range listed, please consider follow up with your Primary Care Provider.   You have been scheduled for a colonoscopy. Please follow written instructions given to you at your visit today.  Please pick up your prep supplies at the pharmacy within the next 1-3 days. If you use inhalers (even only as needed), please bring them with you on the day of your procedure. Your physician has requested that you go to www.startemmi.com and enter the access code given to you at your visit today. This web site gives a general overview about your procedure. However, you should still follow specific instructions given to you by our office regarding your preparation for the procedure.  You will be contacted by our office prior to your procedure for directions on holding your Plavix.  If you do not hear from our office 1 week prior to your scheduled procedure, please call [phone] to discuss.   Take 81mg  of Aspirin daily for 5 days prior to your procedure. You may also take 81mg  of Aspirin the day of your Colonoscopy.  It was a pleasure to see you today!  Vito Cirigliano, D.O.

## 2018-08-12 NOTE — Telephone Encounter (Signed)
   Primary Cardiologist: Kirk Ruths, MD  Chart reviewed as part of pre-operative protocol coverage. The patient takes Plavix for recurrent CVA and left carotid endarterectomy.  He was only seen once by Dr. Stanford Breed 04/2018 after his CVA to exclude cardiac source of emboli. Reassuring TEE.   Recommended clearance from either neurology or vascular surgery.   I will route this recommendation to the requesting party via Epic fax function and remove from pre-op pool.  Please call with questions.  [place], [place] 08/12/2018, 8:54 AM

## 2018-08-12 NOTE — Progress Notes (Signed)
Letter sent to pt PCP for Cardiac Clearance

## 2018-08-17 ENCOUNTER — Ambulatory Visit (INDEPENDENT_AMBULATORY_CARE_PROVIDER_SITE_OTHER): Payer: BLUE CROSS/BLUE SHIELD | Admitting: Physician Assistant

## 2018-08-17 ENCOUNTER — Encounter: Payer: Self-pay | Admitting: Physician Assistant

## 2018-08-17 ENCOUNTER — Ambulatory Visit (INDEPENDENT_AMBULATORY_CARE_PROVIDER_SITE_OTHER): Payer: BLUE CROSS/BLUE SHIELD

## 2018-08-17 ENCOUNTER — Ambulatory Visit: Payer: BLUE CROSS/BLUE SHIELD | Admitting: Physician Assistant

## 2018-08-17 DIAGNOSIS — R221 Localized swelling, mass and lump, neck: Secondary | ICD-10-CM

## 2018-08-17 MED ORDER — IOPAMIDOL (ISOVUE-300) INJECTION 61%
100.0000 mL | Freq: Once | INTRAVENOUS | Status: AC | PRN
  Administered 2018-08-17: 80 mL via INTRAVENOUS

## 2018-08-17 NOTE — Progress Notes (Signed)
HPI:                                                                Ashley Beard is a 52 y.o. 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 She presents today with tender right-sided neck swelling x 4 days. Swelling has improved, but she is concerned about the residual tenderness. Several nights ago she had brief, left-sided arm numbness. Denies focal weakness or paresthesias. She denies fever, chills, malaise, sore throat, URI symptoms.   No flowsheet data found.  No flowsheet data found.    Past Medical History:  Diagnosis Date  . Abnormal Pap smear of cervix    age 38  . Anxiety   . Carotid arterial disease ([place])   . Headache   . Refusal of blood transfusions as patient is Jehovah's Witness   . Stroke ([place])   . Trigeminal neuralgia 06/22/2017   Dx by neuro. Takes Tegretol prn   Past Surgical History:  Procedure Laterality Date  . ENDARTERECTOMY Left 02/24/2017   Procedure: ENDARTERECTOMY CAROTID with removal of thrombus ;  Surgeon: Rosetta Posner, MD;  Location: [facility];  Service: Vascular;  Laterality: Left;  . GYNECOLOGIC CRYOSURGERY     age 4  . IR GENERIC HISTORICAL  02/25/2017   IR ANGIO VERTEBRAL SEL SUBCLAVIAN INNOMINATE UNI R MOD SED 02/25/2017 [name], MD [facility]  . IR GENERIC HISTORICAL  02/25/2017   IR ANGIO INTRA EXTRACRAN SEL COM CAROTID INNOMINATE BILAT MOD SED 02/25/2017 [name], MD [facility]  . OOPHORECTOMY    . TEE WITHOUT CARDIOVERSION N/A 05/04/2017   Procedure: TRANSESOPHAGEAL ECHOCARDIOGRAM (TEE);  Surgeon: Lelon Perla, MD;  Location: [facility] ENDOSCOPY;  Service: Cardiovascular;  Laterality: N/A;   Social History   Tobacco Use  . Smoking status: Former Smoker    Types: Cigarettes    Last attempt to quit: 09/2016    Years since quitting: 1.9  .  Smokeless tobacco: Never Used  . Tobacco comment: very rare, social  Substance Use Topics  . Alcohol use: Yes    Alcohol/week: 2.0 standard drinks    Types: 2 Cans of beer per week    Comment: social   family history includes Colon cancer in her maternal grandmother; Diabetes in her maternal grandmother; Hypertension in her maternal grandmother; Stroke in her paternal grandfather.    ROS: negative except as noted in the HPI  Medications: Current Outpatient Medications  Medication Sig Dispense Refill  . amitriptyline (ELAVIL) 25 MG tablet Take 1 tablet (25 mg total) by mouth at bedtime. 90 tablet 1  . carbamazepine (TEGRETOL) 200 MG tablet Take 1 tablet (200 mg total) by mouth at bedtime. 90 tablet 1  . clopidogrel (PLAVIX) 75 MG tablet TAKE 1 TABLET(75 MG) BY MOUTH DAILY. FOLLOW UP APPOINTMENT WITH PCP BEFORE MORE REFILLS 90 tablet 0  . Sod Picosulfate-Mag Ox-Cit Acd (CLENPIQ) 10-3.5-12 MG-GM -GM/160ML SOLN Take 1 kit by mouth as directed. 2 Bottle 0  . valsartan-hydrochlorothiazide (DIOVAN-HCT) 160-12.5 MG tablet Take 1 tablet by mouth daily. 90 tablet 1   No current facility-administered medications for  this visit.    Allergies  Allergen Reactions  . Penicillins Hives and Other (See Comments)    Nightmares Has patient had a PCN reaction causing immediate rash, facial/tongue/throat swelling, SOB or lightheadedness with hypotension: Yes Has patient had a PCN reaction causing severe rash involving mucus membranes or skin necrosis: No Has patient had a PCN reaction that required hospitalization pt was in the hospital at time of reaction Has patient had a PCN reaction occurring within the last 10 years: No If all of the above answers are "NO", then may proceed with Cephalosporin use.  . Fish Allergy Itching and Swelling    Lymph node swelling  . Pineapple Swelling  . Wheat Bran Swelling  . Other Itching and Swelling    Reaction to blue cheese Lymph node swelling  . Shrimp  [Shellfish Allergy] Itching and Swelling    Lymph node swelling  . Yeast-Related Products Itching and Swelling    Lymph node swelling       Objective:  BP 119/82   Pulse 97   Temp 97.8 F (36.6 C) (Oral)   Wt 142 lb (64.4 kg)   BMI 28.68 kg/m  Gen:  alert, not ill-appearing, no distress, appropriate for age Physical Exam  Neck: Trachea normal, full passive range of motion without pain and phonation normal. Neck supple. No JVD present. Carotid bruit is not present.    Lymphadenopathy:       Head (right side): Submandibular (tenderness without palpable lymphadenopathy) adenopathy present. No submental and no tonsillar adenopathy present.       Head (left side): No submental, no submandibular and no tonsillar adenopathy present.       Right cervical: No superficial cervical, no deep cervical and no posterior cervical adenopathy present.      Left cervical: No superficial cervical, no deep cervical and no posterior cervical adenopathy present.    CLINICAL DATA:  Left neck pain with swallowing. Lymphadenopathy of the head and neck. Odynophagia.  EXAM: CT NECK WITH CONTRAST  TECHNIQUE: Multidetector CT imaging of the neck was performed using the standard protocol following the bolus administration of intravenous contrast.  CONTRAST:  28m ISOVUE-300 IOPAMIDOL (ISOVUE-300) INJECTION 61%  COMPARISON:  None.  FINDINGS: Pharynx and larynx: There is mild prominence of the palatine tonsils bilaterally, left greater than right. No discrete mass lesion is present. The parapharyngeal fat is clear. Vocal cords are opposed. The patient is likely holding her breath. No other focal mucosal or submucosal lesions are evident.  Salivary glands: The submandibular and parotid glands are within normal limits bilaterally.  Thyroid: A 6 mm nodule in the upper pole of the left lobe of the thyroid measures 6 mm maximally. No other focal lesions are present.  Lymph nodes: A right  level 3 lymph node may represent 2 adjacent nodes. The nodal mass measures 22 x 6 x 8 mm. No other significant adenopathy is present.  Vascular: A low-density clot or plaque sits at the level of the left carotid bifurcation measuring 8 x 8 x 9 mm. There is thrombus extending into the left external carotid artery. No other significant vascular disease is present. A three-vessel aortic arch is present.  Limited intracranial: Negative  Visualized orbits: The globes and orbits are within normal limits.  Mastoids and visualized paranasal sinuses: The paranasal sinuses and mastoid air cells are clear.  Skeleton: No focal lytic or blastic lesions are present. Vertebral body heights and alignment are normal.  Upper chest: The lung apices demonstrate mild dependent  atelectasis. No focal nodule, mass, or airspace disease is present.  IMPRESSION: 1. 9 mm thrombus or plaque at the left carotid bifurcation with extension into the left external carotid artery. This could be related to the patient's left-sided neck pain. This may be from a central source will represent a contained dissection. The recommend emergent evaluation with consideration of anti coagulation to prevent intracranial embolization. 2. No other significant vascular disease. 3. Slight prominence of the palatine tonsils. No adjacent inflammatory change is evident. 4. Enlarged right level 3 lymph node is likely reactive. This may represent 2 adjacent nodes. These results were called by telephone at the time of interpretation on 02/22/2017 at 4:39 pm to Dr. Nelson Chimes , who verbally acknowledged these results.   Electronically Signed   By: San Morelle M.D.   On: 02/22/2017 16:39   No results found for this or any previous visit (from the past 72 hour(s)). No results found.    Assessment and Plan: 52 y.o. female with   .Diagnoses and all orders for this visit:  Localized swelling, mass or  lump of neck -     Cancel: CT Soft Tissue Neck W Contrast -     Cancel: CT Soft Tissue Neck W Contrast -     CT Soft Tissue Neck W Contrast   Benign exam, DDx includes reactive lymph node, sialadenitis, cyst, and carotid disease. Given patient's history of carotid thrombus, will obtain stat CT neck w/contrast   Patient education and anticipatory guidance given Patient agrees with treatment plan Follow-up as needed if symptoms worsen or fail to improve  Darlyne Russian PA-C

## 2018-08-17 NOTE — Patient Instructions (Signed)
[facility] [address] [place], [place]

## 2018-08-18 ENCOUNTER — Encounter: Payer: Self-pay | Admitting: Physician Assistant

## 2018-08-18 ENCOUNTER — Telehealth: Payer: Self-pay | Admitting: Physician Assistant

## 2018-08-18 DIAGNOSIS — D320 Benign neoplasm of cerebral meninges: Secondary | ICD-10-CM

## 2018-08-18 HISTORY — DX: Benign neoplasm of cerebral meninges: D32.0

## 2018-08-18 NOTE — Telephone Encounter (Signed)
Call #2 with CT neck results Left voicemail for patient to callback clinic

## 2018-08-26 ENCOUNTER — Ambulatory Visit (INDEPENDENT_AMBULATORY_CARE_PROVIDER_SITE_OTHER): Payer: BLUE CROSS/BLUE SHIELD | Admitting: Physician Assistant

## 2018-08-26 ENCOUNTER — Encounter: Payer: Self-pay | Admitting: Physician Assistant

## 2018-08-26 VITALS — BP 125/84 | HR 90 | Wt 142.0 lb

## 2018-08-26 DIAGNOSIS — Z1211 Encounter for screening for malignant neoplasm of colon: Secondary | ICD-10-CM

## 2018-08-26 DIAGNOSIS — I10 Essential (primary) hypertension: Secondary | ICD-10-CM

## 2018-08-26 DIAGNOSIS — Z01818 Encounter for other preprocedural examination: Secondary | ICD-10-CM

## 2018-08-26 DIAGNOSIS — Z79899 Other long term (current) drug therapy: Secondary | ICD-10-CM | POA: Diagnosis not present

## 2018-08-26 DIAGNOSIS — I779 Disorder of arteries and arterioles, unspecified: Secondary | ICD-10-CM | POA: Diagnosis not present

## 2018-08-26 LAB — LIPID PANEL W/REFLEX DIRECT LDL
CHOL/HDL RATIO: 2.6 (calc) (ref <5.0)
CHOLESTEROL: 218 mg/dL — AB (ref <200)
HDL: 85 mg/dL (ref >50)
LDL Cholesterol (Calc): 112 mg/dL (calc) — ABNORMAL HIGH
Non-HDL Cholesterol (Calc): 133 mg/dL (calc) — ABNORMAL HIGH (ref <130)
Triglycerides: 104 mg/dL (ref <150)

## 2018-08-26 LAB — CBC
HCT: 38.9 % (ref 35.0–45.0)
HEMOGLOBIN: 13.4 g/dL (ref 11.7–15.5)
MCH: 31.8 pg (ref 27.0–33.0)
MCHC: 34.4 g/dL (ref 32.0–36.0)
MCV: 92.2 fL (ref 80.0–100.0)
MPV: 11.1 fL (ref 7.5–12.5)
Platelets: 249 10*3/uL (ref 140–400)
RBC: 4.22 10*6/uL (ref 3.80–5.10)
RDW: 13.1 % (ref 11.0–15.0)
WBC: 8.2 10*3/uL (ref 3.8–10.8)

## 2018-08-26 LAB — COMPREHENSIVE METABOLIC PANEL
AG RATIO: 1.6 (calc) (ref 1.0–2.5)
ALBUMIN MSPROF: 4.7 g/dL (ref 3.6–5.1)
ALKALINE PHOSPHATASE (APISO): 102 U/L (ref 33–130)
ALT: 21 U/L (ref 6–29)
AST: 16 U/L (ref 10–35)
BILIRUBIN TOTAL: 0.3 mg/dL (ref 0.2–1.2)
BUN: 11 mg/dL (ref 7–25)
CHLORIDE: 97 mmol/L — AB (ref 98–110)
CO2: 31 mmol/L (ref 20–32)
CREATININE: 0.85 mg/dL (ref 0.50–1.05)
Calcium: 9.6 mg/dL (ref 8.6–10.4)
GLOBULIN: 2.9 g/dL (ref 1.9–3.7)
Glucose, Bld: 84 mg/dL (ref 65–99)
POTASSIUM: 3.4 mmol/L — AB (ref 3.5–5.3)
Sodium: 137 mmol/L (ref 135–146)
Total Protein: 7.6 g/dL (ref 6.1–8.1)

## 2018-08-26 NOTE — Progress Notes (Signed)
Subjective:    Ashley Beard is a 52 y.o. female who presents to the office today for a preoperative consultation at the request of Dr. Gerrit Heck who plans on performing screening colonoscopy on 09/30/2018. This consultation is requested for the specific conditions prompting preoperative evaluation (i.e. because of potential affect on operative risk): history of CVA on Plavix. Planned anesthesia: IV sedation. The patient has the following known anesthesia issues: none. Patients bleeding risk: no abnormal bleeding, takes Plavix. Patient does have objections to receiving blood products if needed.  The following portions of the patient's history were reviewed and updated as appropriate: allergies, current medications, past family history, past medical history, past social history, past surgical history and problem list.  Review of Systems A comprehensive review of systems was negative except for: Neurological: positive for headaches    Objective:   Vitals:   08/26/18 1354  BP: 125/84  Pulse: 90   Gen:  alert, not ill-appearing, no distress, appropriate for age 12: head normocephalic without obvious abnormality, conjunctiva and cornea clear, trachea midline Pulm: Normal work of breathing, normal phonation, clear to auscultation bilaterally, no wheezes, rales or rhonchi CV: Normal rate, regular rhythm, s1 and s2 distinct, no murmurs, clicks or rubs  Neuro: alert and oriented x 3, no tremor MSK: extremities atraumatic, normal gait and station Skin: intact, no rashes on exposed skin, no jaundice, no cyanosis Psych: well-groomed, cooperative, good eye contact, euthymic mood, affect mood-congruent, speech is articulate, and thought processes clear and goal-directed   Cardiographics ECG: normal sinus rhythm, no blocks or conduction defects, no ischemic changes Echocardiogram: normal and reviewed by myself  Imaging Chest x-ray: not performed   Lab Review  Office Visit on 06/13/2018   Component Date Value  . WBC 08/26/2018 8.2   . RBC 08/26/2018 4.22   . Hemoglobin 08/26/2018 13.4   . HCT 08/26/2018 38.9   . MCV 08/26/2018 92.2   . [facility] 08/26/2018 31.8   . MCHC 08/26/2018 34.4   . RDW 08/26/2018 13.1   . Platelets 08/26/2018 249   . MPV 08/26/2018 11.1   . Glucose, Bld 08/26/2018 84   . BUN 08/26/2018 11   . Creat 08/26/2018 0.85   . BUN/Creatinine Ratio 29/52/8413 NOT APPLICABLE   . Sodium 08/26/2018 137   . Potassium 08/26/2018 3.4*  . Chloride 08/26/2018 97*  . CO2 08/26/2018 31   . Calcium 08/26/2018 9.6   . Total Protein 08/26/2018 7.6   . Albumin 08/26/2018 4.7   . Globulin 08/26/2018 2.9   . AG Ratio 08/26/2018 1.6   . Total Bilirubin 08/26/2018 0.3   . Alkaline phosphatase (AP* 08/26/2018 102   . AST 08/26/2018 16   . ALT 08/26/2018 21   . Cholesterol 08/26/2018 218*  . HDL 08/26/2018 85   . Triglycerides 08/26/2018 104   . LDL Cholesterol (Calc) 08/26/2018 112*  . Total CHOL/HDL Ratio 08/26/2018 2.6   . Non-HDL Cholesterol (Cal* 08/26/2018 133*      Assessment:      52 y.o. female with planned surgery as above.   Known risk factors for perioperative complications: Refusal of blood products even if needed to save life history of CVA   Difficulty with intubation is not anticipated.  Cardiac Risk Estimation: 1 point for CVA, Class II risk  Current medications which may produce withdrawal symptoms if withheld perioperatively: Elavil, Tegretol   Plan:   After further discussion with the patient, she elected to cancel her colonoscopy and proceed with Cologuard testing.  She has no prior history of abnormal colonoscopy, no family history in a first degree relative, no family history of HMPS. She is an appropriate candidate for Cologuard. Additionally, her risks for perioperative complications is increased due to her prior post-op CVA and her refusal of blood products. Risk of colonoscopy likely exceeds benefit in this patient.  Cologuard was  ordered today.  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 This was present on her MR Brain w/ and w/o 02/25/2017, which was ordered following her acute stroke affecting the left MCA territory. At that time it was measured at 10 x 8 x 10 mm. Patient states she was never informed that she had a meningioma. On recent CT neck meningioma was again visualized, measuring 5 x 12 mm.  She was referred to Neurosurgery for further management and has an upcoming appointment.  Darlyne Russian PA-C

## 2018-08-26 NOTE — Patient Instructions (Signed)
Meningioma Meningioma is a tumor that occurs in the thin tissue that covers the brain and spinal cord (meninges). Meningiomas are usually not cancerous (benign) and do not spread to other areas. In rare cases, a meningioma may become cancerous (malignant). What are the causes? In many cases, the cause of this condition is not known. In some cases, meningioma may be caused by:  Having a genetic disorder that causes multiple soft tumors (neurofibromatosis 2).  A change in certain genes (genetic mutation).  What increases the risk? You are more likely to develop this condition if:  You have been exposed to radiation.  You are an older woman. Older women have a higher risk of meningiomas than men or children. However, men have a higher risk of malignant meningiomas.  You have injured your head in the past.  You have a history of breast cancer.  What are the signs or symptoms? Symptoms of this condition usually begin very slowly. The symptoms may depend on the size and location of the tumor. Possible symptoms include:  Headaches.  Nausea and vomiting.  Vision changes.  Hearing changes.  Loss of the sense of smell.  Fits of uncontrolled movements (seizures).  Weakness or numbness on one side of the body or in an arm or leg.  Mood or personality changes.  Problems with memory or thinking.  How is this diagnosed? This condition is diagnosed based on:  Results of brain imaging tests, such as a CT scan or MRI.  Removal and testing of a sample of the tumor (biopsy). This may be done to confirm the diagnosis and to help determine the best treatment for the condition.  How is this treated? You may not have treatment until your symptoms start to affect your daily activities. This is because meningioma grows so slowly, and your health care provider may prefer to monitor its growth before starting treatment. If you do need treatment, it may include:  Medicines to decrease brain  swelling and improve symptoms (steroids).  High-energy rays (radiation therapy) to shrink or kill the tumor.  Anti-cancer medicines (chemotherapy) to shrink or kill the tumor. Chemotherapy has many side effects because it also kills healthy cells.  Targeted therapy. This kills cancerous cells without affecting normal cells.  Surgery to remove as much of the tumor as possible.  Follow these instructions at home:  Take over-the-counter and prescription medicines only as told by your health care provider.  Keep all follow-up visits as told by your health care provider. This is important. You may need regular visits to monitor the growth of your tumor. Contact a health care provider if:  You have symptoms that come back.  You have diarrhea.  You vomit.  You have abdominal pain.  You cannot eat or drink as much as you need.  You are weaker or more tired than usual.  You are losing weight without trying. Get help right away if:  Your diarrhea, vomiting, or abdominal pain does not go away.  You have new symptoms, such as vision problems or difficulty walking.  You have a seizure.  You have bleeding that does not stop.  You have trouble breathing.  You have a fever. Summary  Meningioma is a tumor that occurs in the thin tissue that covers the brain and spinal cord (meninges).  Meningiomas are usually benign, which means they are not cancerous and do not spread to other areas.  Symptoms of this condition usually begin very slowly. The symptoms may depend on the   size and location of the tumor.  Your tumor may be monitored over time. You may not need treatment until your tumor starts to affect your daily life. This information is not intended to replace advice given to you by your health care provider. Make sure you discuss any questions you have with your health care provider. Document Released: 11/28/2013 Document Revised: 11/27/2016 Document Reviewed: 11/27/2016 Elsevier  Interactive Patient Education  2017 Elsevier Inc.  

## 2018-08-29 DIAGNOSIS — E876 Hypokalemia: Secondary | ICD-10-CM | POA: Insufficient documentation

## 2018-08-29 MED ORDER — ATORVASTATIN CALCIUM 10 MG PO TABS: 10.0000 mg | ORAL_TABLET | Freq: Every day | ORAL | 3 refills | Status: DC

## 2018-08-29 NOTE — Addendum Note (Signed)
Addended by: Nelson Chimes E on: 08/29/2018 10:21 AM   Modules accepted: Orders

## 2018-08-30 ENCOUNTER — Encounter: Payer: Self-pay | Admitting: Physician Assistant

## 2018-08-30 DIAGNOSIS — D329 Benign neoplasm of meninges, unspecified: Secondary | ICD-10-CM | POA: Diagnosis not present

## 2018-08-30 DIAGNOSIS — Z6828 Body mass index (BMI) 28.0-28.9, adult: Secondary | ICD-10-CM | POA: Diagnosis not present

## 2018-08-30 DIAGNOSIS — R030 Elevated blood-pressure reading, without diagnosis of hypertension: Secondary | ICD-10-CM | POA: Diagnosis not present

## 2018-09-05 ENCOUNTER — Telehealth: Payer: Self-pay | Admitting: Physician Assistant

## 2018-09-05 DIAGNOSIS — G4489 Other headache syndrome: Secondary | ICD-10-CM

## 2018-09-05 MED ORDER — ASPIRIN 325 MG PO TABS: 325.0000 mg | ORAL_TABLET | Freq: Every day | ORAL | Status: AC

## 2018-09-05 NOTE — Telephone Encounter (Signed)
Left vm for pt to return call to clinic -EH/RMA  

## 2018-09-05 NOTE — Telephone Encounter (Signed)
Please let Ashley Beard know I spoke with her vascular doctor. He recommended we switch her from Plavix to Aspirin. She can stop Plavix after today and start Asprin 325 mg daily. He also would like her to have carotid ultrasound every 2 years. Since she just had CT neck this month, she is good until September 2021.

## 2018-09-05 NOTE — Telephone Encounter (Signed)
Pt notified of recommendation.  She is wanting a referral to a headache specialist. Please advise. -EH/RMA

## 2018-09-05 NOTE — Telephone Encounter (Signed)
-----   Message from Rosetta Posner, MD sent at 09/05/2018  1:06 PM EDT ----- Regarding: RE: Plavix I certainly understand.  From a vascular surgery standpoint, and I feel that she is fine with daily aspirin and not Plavix.  Would recommend carotid duplex every 2 years to rule out any new difficulties.  Can see [place] on an as-needed basis ----- Message ----- From: Trixie Dredge, PA-C Sent: 08/30/2018  12:11 PM EDT To: Rosetta Posner, MD Subject: Plavix                                         Hello Dr. Donnetta Hutching, I just wanted to touch base with you regarding our mutual patient. She has some financial difficulties and trouble taking time off work, so she has been following with me rather than her specialists. I understand from your note you would like her to proceed with yearly carotid US. What is the plan as far as her Plavix? Thank you, Evlyn Clines

## 2018-09-05 NOTE — Telephone Encounter (Signed)
Referral placed to [facility] in [place]

## 2018-09-09 ENCOUNTER — Ambulatory Visit
Admission: RE | Admit: 2018-09-09 | Discharge: 2018-09-09 | Disposition: A | Discharge status: Home / Self Care | Payer: BLUE CROSS/BLUE SHIELD | Source: Ambulatory Visit | Attending: Physician Assistant | Admitting: Physician Assistant

## 2018-09-09 ENCOUNTER — Encounter: Payer: Self-pay | Admitting: Physician Assistant

## 2018-09-09 DIAGNOSIS — R928 Other abnormal and inconclusive findings on diagnostic imaging of breast: Secondary | ICD-10-CM | POA: Diagnosis not present

## 2018-09-09 DIAGNOSIS — N6320 Unspecified lump in the left breast, unspecified quadrant: Secondary | ICD-10-CM

## 2018-09-15 DIAGNOSIS — Z8673 Personal history of transient ischemic attack (TIA), and cerebral infarction without residual deficits: Secondary | ICD-10-CM | POA: Diagnosis not present

## 2018-09-15 DIAGNOSIS — IMO0002 Reserved for concepts with insufficient information to code with codable children: Secondary | ICD-10-CM | POA: Insufficient documentation

## 2018-09-15 DIAGNOSIS — G43709 Chronic migraine without aura, not intractable, without status migrainosus: Secondary | ICD-10-CM | POA: Diagnosis not present

## 2018-09-15 DIAGNOSIS — E663 Overweight: Secondary | ICD-10-CM | POA: Diagnosis not present

## 2018-09-30 ENCOUNTER — Encounter: Payer: BLUE CROSS/BLUE SHIELD | Admitting: Gastroenterology

## 2018-12-06 ENCOUNTER — Encounter: Payer: Self-pay | Admitting: Physician Assistant

## 2018-12-08 ENCOUNTER — Ambulatory Visit: Payer: BLUE CROSS/BLUE SHIELD | Admitting: Physician Assistant

## 2018-12-14 ENCOUNTER — Encounter: Payer: Self-pay | Admitting: Physician Assistant

## 2018-12-14 ENCOUNTER — Ambulatory Visit: Payer: BLUE CROSS/BLUE SHIELD | Admitting: Physician Assistant

## 2018-12-14 VITALS — BP 122/83 | HR 108 | Wt 136.0 lb

## 2018-12-14 DIAGNOSIS — R200 Anesthesia of skin: Secondary | ICD-10-CM

## 2018-12-14 NOTE — Progress Notes (Signed)
HPI:                                                                Ashley Beard is a 53 y.o. female who presents to [facility] today for left arm numbness  Reports new onset numbness in her left arm when waking up in the morning. There was numbness from her shoulder to her fingertips with tingling in her fingertips.  There was no associated pain.  No chest pain, palpitations or breathing difficulties. Throughout the day symptoms gradually improved and resolved spontaneously that same day. Denies shoulder pain. She states she had some left-sided neck tightness in the area of her prior carotid surgery, but denies any persistent neck pain or stiffness.  No jaw claudication. Reports she typically sleeps on her left side.  No flowsheet data found.  No flowsheet data found.    Past Medical History:  Diagnosis Date  . Abnormal Pap smear of cervix    age 53  . Anxiety   . Carotid arterial disease ([place])   . Headache   . Meningioma, cerebral ([place]) 08/18/2018   5 x 12 mm meningioma left middle cranial fossa similar in size to the prior MRI of 02/25/2017  . Refusal of blood transfusions as patient is Jehovah's Witness   . Stroke ([facility])   . Trigeminal neuralgia 06/22/2017   Dx by neuro. Takes Tegretol prn   Past Surgical History:  Procedure Laterality Date  . ENDARTERECTOMY Left 02/24/2017   Procedure: ENDARTERECTOMY CAROTID with removal of thrombus ;  Surgeon: Rosetta Posner, MD;  Location: [facility];  Service: Vascular;  Laterality: Left;  . GYNECOLOGIC CRYOSURGERY     age 53  . IR GENERIC HISTORICAL  02/25/2017   IR ANGIO VERTEBRAL SEL SUBCLAVIAN INNOMINATE UNI R MOD SED 02/25/2017 [name], MD [facility]  . IR GENERIC HISTORICAL  02/25/2017   IR ANGIO INTRA EXTRACRAN SEL COM CAROTID INNOMINATE BILAT MOD SED 02/25/2017 [name], MD [facility]  . OOPHORECTOMY    . TEE WITHOUT CARDIOVERSION N/A 05/04/2017   Procedure:  TRANSESOPHAGEAL ECHOCARDIOGRAM (TEE);  Surgeon: Lelon Perla, MD;  Location: [facility] ENDOSCOPY;  Service: Cardiovascular;  Laterality: N/A;   Social History   Tobacco Use  . Smoking status: Former Smoker    Types: Cigarettes    Last attempt to quit: 09/2016    Years since quitting: 2.2  . Smokeless tobacco: Never Used  . Tobacco comment: very rare, social  Substance Use Topics  . Alcohol use: Yes    Alcohol/week: 2.0 standard drinks    Types: 2 Cans of beer per week    Comment: social   family history includes Colon cancer in her maternal grandmother; Diabetes in her maternal grandmother; Hypertension in her maternal grandmother; Stroke in her paternal grandfather.    ROS: negative except as noted in the HPI  Medications: Current Outpatient Medications  Medication Sig Dispense Refill  . amitriptyline (ELAVIL) 25 MG tablet Take 1 tablet (25 mg total) by mouth at bedtime. 90 tablet 1  . aspirin 325 MG tablet Take 1 tablet (325 mg total) by mouth daily.    Marland Kitchen atorvastatin (LIPITOR) 10 MG tablet Take 1 tablet (10 mg total) by mouth at bedtime. [place]  tablet 3  . carbamazepine (TEGRETOL) 200 MG tablet Take 1 tablet (200 mg total) by mouth at bedtime. 90 tablet 1  . Omega-3 Fatty Acids (FISH OIL ADULT GUMMIES PO) Take by mouth.    . Sod Picosulfate-Mag Ox-Cit Acd (CLENPIQ) 10-3.5-12 MG-GM -GM/160ML SOLN Take 1 kit by mouth as directed. 2 Bottle 0  . valsartan-hydrochlorothiazide (DIOVAN-HCT) 160-12.5 MG tablet Take 1 tablet by mouth daily. 90 tablet 1   No current facility-administered medications for this visit.    Allergies  Allergen Reactions  . Penicillins Hives and Other (See Comments)    Nightmares Has patient had a PCN reaction causing immediate rash, facial/tongue/throat swelling, SOB or lightheadedness with hypotension: Yes Has patient had a PCN reaction causing severe rash involving mucus membranes or skin necrosis: No Has patient had a PCN reaction that required  hospitalization pt was in the hospital at time of reaction Has patient had a PCN reaction occurring within the last 10 years: No If all of the above answers are "NO", then may proceed with Cephalosporin use.  . Fish Allergy Itching and Swelling    Lymph node swelling  . Pineapple Swelling  . Wheat Bran Swelling  . Other Itching and Swelling    Reaction to blue cheese Lymph node swelling  . Shrimp [Shellfish Allergy] Itching and Swelling    Lymph node swelling  . Yeast-Related Products Itching and Swelling    Lymph node swelling       Objective:  BP 122/83   Pulse (!) 108   Wt 136 lb (61.7 kg)   BMI 27.47 kg/m  Gen:  alert, not ill-appearing, no distress, appropriate for age 53: head normocephalic without obvious abnormality, conjunctiva and cornea clear, trachea midline Pulm: Normal work of breathing, normal phonation, clear to auscultation bilaterally, no wheezes, rales or rhonchi CV: Normal rate, regular rhythm, s1 and s2 distinct, no murmurs, clicks or rubs; no carotid bruit Neuro: alert and oriented x 3, no tremor Neck: Atraumatic, no midline tenderness, no paraspinal muscle spasm, range of motion intact MSK: extremities atraumatic, normal gait and station Left shoulder/arm: Atraumatic, no AC joint tenderness, full active range of motion, strength intact, negative Hawkins, negative Neer's Skin: intact, no rashes on exposed skin, no jaundice, no cyanosis Psych: well-groomed, cooperative, good eye contact, euthymic mood, affect mood-congruent, speech is articulate, and thought processes clear and goal-directed    No results found for this or any previous visit (from the past 72 hour(s)). No results found.    Assessment and Plan: 53 y.o. female with   .Elli was seen today for follow-up.  Diagnoses and all orders for this visit:  Left arm numbness     Benign exam There has been no recurrence of symptoms I think patient compressed her ulnar nerve during  sleep causing a temporary neuropathy Reassurance provided If symptoms recur we could consider an elbow sleeve at bedtime  Patient education and anticipatory guidance given Patient agrees with treatment plan Follow-up as needed if symptoms worsen or fail to improve  Darlyne Russian PA-C

## 2018-12-18 ENCOUNTER — Encounter: Payer: Self-pay | Admitting: Physician Assistant

## 2018-12-19 ENCOUNTER — Ambulatory Visit: Payer: BLUE CROSS/BLUE SHIELD | Admitting: Physician Assistant

## 2019-01-04 DIAGNOSIS — G43709 Chronic migraine without aura, not intractable, without status migrainosus: Secondary | ICD-10-CM | POA: Diagnosis not present

## 2019-01-04 DIAGNOSIS — Z8673 Personal history of transient ischemic attack (TIA), and cerebral infarction without residual deficits: Secondary | ICD-10-CM | POA: Diagnosis not present

## 2019-02-02 ENCOUNTER — Encounter: Payer: Self-pay | Admitting: Physician Assistant

## 2019-02-06 ENCOUNTER — Ambulatory Visit (INDEPENDENT_AMBULATORY_CARE_PROVIDER_SITE_OTHER): Payer: BLUE CROSS/BLUE SHIELD | Admitting: Sports Medicine

## 2019-02-06 ENCOUNTER — Encounter: Payer: Self-pay | Admitting: Physician Assistant

## 2019-02-06 ENCOUNTER — Ambulatory Visit (INDEPENDENT_AMBULATORY_CARE_PROVIDER_SITE_OTHER): Payer: BLUE CROSS/BLUE SHIELD

## 2019-02-06 ENCOUNTER — Encounter: Payer: Self-pay | Admitting: Sports Medicine

## 2019-02-06 DIAGNOSIS — I771 Stricture of artery: Secondary | ICD-10-CM

## 2019-02-06 DIAGNOSIS — Z8673 Personal history of transient ischemic attack (TIA), and cerebral infarction without residual deficits: Secondary | ICD-10-CM

## 2019-02-06 DIAGNOSIS — M542 Cervicalgia: Secondary | ICD-10-CM

## 2019-02-06 MED ORDER — GADOBENATE DIMEGLUMINE 529 MG/ML IV SOLN
12.0000 mL | Freq: Once | INTRAVENOUS | Status: AC | PRN
  Administered 2019-02-06: 12 mL via INTRAVENOUS

## 2019-02-06 NOTE — Progress Notes (Signed)
Subjective:    CC: Pain on the left side of the neck  HPI: Ashley Beard is a pleasant 53 year old female, for the past several days to week she has had increasing pain on the left side of her neck.  Over the carotid.  She does have a history of a carotid clot, stenosis, she is post endarterectomy with perioperative cerebral infarction with no long-term functional loss.  More recently she is had pain in the left side of her neck, and she is very concerned.  No neurologic symptoms.  Symptoms are moderate, persistent.  I reviewed the past medical history, family history, social history, surgical history, and allergies today and no changes were needed.  Please see the problem list section below in epic for further details.  Past Medical History: Past Medical History:  Diagnosis Date  . Abnormal Pap smear of cervix    age 50  . Anxiety   . Carotid arterial disease ([place])   . Headache   . Meningioma, cerebral ([place]) 08/18/2018   5 x 12 mm meningioma left middle cranial fossa similar in size to the prior MRI of 02/25/2017  . Refusal of blood transfusions as patient is Jehovah's Witness   . Stroke ([place])   . Trigeminal neuralgia 06/22/2017   Dx by neuro. Takes Tegretol prn   Past Surgical History: Past Surgical History:  Procedure Laterality Date  . ENDARTERECTOMY Left 02/24/2017   Procedure: ENDARTERECTOMY CAROTID with removal of thrombus ;  Surgeon: Rosetta Posner, MD;  Location: [facility];  Service: Vascular;  Laterality: Left;  . GYNECOLOGIC CRYOSURGERY     age 67  . IR GENERIC HISTORICAL  02/25/2017   IR ANGIO VERTEBRAL SEL SUBCLAVIAN INNOMINATE UNI R MOD SED 02/25/2017 [name], MD [facility]  . IR GENERIC HISTORICAL  02/25/2017   IR ANGIO INTRA EXTRACRAN SEL COM CAROTID INNOMINATE BILAT MOD SED 02/25/2017 [name], MD [facility]  . OOPHORECTOMY    . TEE WITHOUT CARDIOVERSION N/A 05/04/2017   Procedure: TRANSESOPHAGEAL ECHOCARDIOGRAM (TEE);  Surgeon: Lelon Perla, MD;   Location: [facility] ENDOSCOPY;  Service: Cardiovascular;  Laterality: N/A;   Social History: Social History   Socioeconomic History  . Marital status: Divorced    Spouse name: Not on file  . Number of children: 3  . Years of education: Not on file  . Highest education level: Not on file  Occupational History  . Not on file  Social Needs  . Financial resource strain: Not on file  . Food insecurity:    Worry: Not on file    Inability: Not on file  . Transportation needs:    Medical: Not on file    Non-medical: Not on file  Tobacco Use  . Smoking status: Former Smoker    Types: Cigarettes    Last attempt to quit: 09/2016    Years since quitting: 2.4  . Smokeless tobacco: Never Used  . Tobacco comment: very rare, social  Substance and Sexual Activity  . Alcohol use: Yes    Alcohol/week: 2.0 standard drinks    Types: 2 Cans of beer per week    Comment: social  . Drug use: No  . Sexual activity: Yes    Birth control/protection: Condom  Lifestyle  . Physical activity:    Days per week: Not on file    Minutes per session: Not on file  . Stress: Not on file  Relationships  . Social connections:    Talks on phone: Not on file  Gets together: Not on file    Attends religious service: Not on file    Active member of club or organization: Not on file    Attends meetings of clubs or organizations: Not on file    Relationship status: Not on file  Other Topics Concern  . Not on file  Social History Narrative  . Not on file   Family History: Family History  Problem Relation Age of Onset  . Diabetes Maternal Grandmother        Died of heart attack  . Hypertension Maternal Grandmother   . Colon cancer Maternal Grandmother   . Stroke Paternal Grandfather   . Esophageal cancer Neg Hx    Allergies: Allergies  Allergen Reactions  . Penicillins Hives and Other (See Comments)    Nightmares Has patient had a PCN reaction causing immediate rash, facial/tongue/throat swelling, SOB  or lightheadedness with hypotension: Yes Has patient had a PCN reaction causing severe rash involving mucus membranes or skin necrosis: No Has patient had a PCN reaction that required hospitalization pt was in the hospital at time of reaction Has patient had a PCN reaction occurring within the last 10 years: No If all of the above answers are "NO", then may proceed with Cephalosporin use.  . Fish Allergy Itching and Swelling    Lymph node swelling  . Pineapple Swelling  . Wheat Bran Swelling  . Other Itching and Swelling    Reaction to blue cheese Lymph node swelling  . Shrimp [Shellfish Allergy] Itching and Swelling    Lymph node swelling  . Yeast-Related Products Itching and Swelling    Lymph node swelling   Medications: See med rec.  Review of Systems: No fevers, chills, night sweats, weight loss, chest pain, or shortness of breath.   Objective:    General: Well Developed, well nourished, and in no acute distress.  Neuro: Alert and oriented x3, extra-ocular muscles intact, sensation grossly intact.  Cranial nerves II through XII are intact, motor, sensory, and coordinative functions are all intact, no pronator drift, negative Romberg sign, no dysdiadochokinesis, no finger dysmetria. HEENT: Normocephalic, atraumatic, pupils equal round reactive to light, neck supple, no masses, no lymphadenopathy, thyroid nonpalpable.  Carotids palpable, there are also a few shotty lymph nodes palpable, minimally tender. Skin: Warm and dry, no rashes. Cardiac: Regular rate and rhythm, no murmurs rubs or gallops, no lower extremity edema.  Respiratory: Clear to auscultation bilaterally. Not using accessory muscles, speaking in full sentences.  Normal MRA of the neck.  No evidence of carotid dissection.  Impression and Recommendations:    History of cerebrovascular accident (CVA) from left carotid artery occlusion involving left middle cerebral artery territory History of left cerebral stroke with  resolution of symptoms. She did have severe carotid stenosis, with clot, she is post carotid endarterectomy from 2018. Now with new onset pain in her left neck, directly over the carotid. I am going to order a stat MR angiography of her neck to evaluate for carotid stenosis and/or carotid dissection. She has no neurologic symptoms and has a completely normal neurologic exam. She had normal BUN and creatinine about 6 months ago. Her blood pressure has been controlled. Due to urgency of this test I think we will bypass the initial need for labs, I discussed this with radiologist Dr. Ardeen Garland down the reading room and he has approved. I do think she needs to go back on Plavix. I would like her to discuss this with her PCP. ___________________________________________ Gwen Her. Dianah Field,  M.D., ABFM., CAQSM. Primary Care and Sports Medicine [facility]  Adjunct Professor of [employer]

## 2019-02-06 NOTE — Assessment & Plan Note (Signed)
History of left cerebral stroke with resolution of symptoms. She did have severe carotid stenosis, with clot, she is post carotid endarterectomy from 2018. Now with new onset pain in her left neck, directly over the carotid. I am going to order a stat MR angiography of her neck to evaluate for carotid stenosis and/or carotid dissection. She has no neurologic symptoms and has a completely normal neurologic exam. She had normal BUN and creatinine about 6 months ago. Her blood pressure has been controlled. Due to urgency of this test I think we will bypass the initial need for labs, I discussed this with radiologist Dr. Ardeen Garland down the reading room and he has approved. I do think she needs to go back on Plavix. I would like her to discuss this with her PCP.

## 2019-02-13 ENCOUNTER — Encounter: Payer: Self-pay | Admitting: Physician Assistant

## 2019-02-13 ENCOUNTER — Ambulatory Visit (INDEPENDENT_AMBULATORY_CARE_PROVIDER_SITE_OTHER): Payer: BLUE CROSS/BLUE SHIELD | Admitting: Physician Assistant

## 2019-02-13 VITALS — BP 121/83 | HR 89 | Wt 132.0 lb

## 2019-02-13 DIAGNOSIS — R457 State of emotional shock and stress, unspecified: Secondary | ICD-10-CM | POA: Insufficient documentation

## 2019-02-13 DIAGNOSIS — I10 Essential (primary) hypertension: Secondary | ICD-10-CM

## 2019-02-13 DIAGNOSIS — E876 Hypokalemia: Secondary | ICD-10-CM

## 2019-02-13 DIAGNOSIS — G4489 Other headache syndrome: Secondary | ICD-10-CM

## 2019-02-13 MED ORDER — CARBAMAZEPINE 200 MG PO TABS: 200.0000 mg | ORAL_TABLET | Freq: Every day | ORAL | 1 refills | Status: DC

## 2019-02-13 MED ORDER — AMITRIPTYLINE HCL 25 MG PO TABS: 50.0000 mg | ORAL_TABLET | Freq: Every day | ORAL | 1 refills | Status: DC

## 2019-02-13 NOTE — Progress Notes (Signed)
HPI:                                                                Ashley Beard is a 53 y.o. female who presents to [facility] today for follow-up left-sided neck pain  [name] presented 1 week ago on 02/06/19 for left-sided neck pain. Due to her history of carotid artery disease and CVA, MRA of neck was ordered. MRA was negative. Presents today for follow-up. Reports she as not had any additional neck pain since her visit 1 week ago.  States she has had a lot of anxiety worrying about her daughter who is having some health issues. She states she is experiencing stress caring for her and having her daughter live with her. Reports she has felt very tired and slept most of the day yesterday.    Past Medical History:  Diagnosis Date  . Abnormal Pap smear of cervix    age 43  . Anxiety   . Carotid arterial disease ([place])   . Headache   . Meningioma, cerebral ([place]) 08/18/2018   5 x 12 mm meningioma left middle cranial fossa similar in size to the prior MRI of 02/25/2017  . Refusal of blood transfusions as patient is Jehovah's Witness   . Stroke ([place])   . Trigeminal neuralgia 06/22/2017   Dx by neuro. Takes Tegretol prn   Past Surgical History:  Procedure Laterality Date  . ENDARTERECTOMY Left 02/24/2017   Procedure: ENDARTERECTOMY CAROTID with removal of thrombus ;  Surgeon: Rosetta Posner, MD;  Location: [facility];  Service: Vascular;  Laterality: Left;  . GYNECOLOGIC CRYOSURGERY     age 34  . IR GENERIC HISTORICAL  02/25/2017   IR ANGIO VERTEBRAL SEL SUBCLAVIAN INNOMINATE UNI R MOD SED 02/25/2017 [name], MD [facility]  . IR GENERIC HISTORICAL  02/25/2017   IR ANGIO INTRA EXTRACRAN SEL COM CAROTID INNOMINATE BILAT MOD SED 02/25/2017 [name], MD [facility]  . OOPHORECTOMY    . TEE WITHOUT CARDIOVERSION N/A 05/04/2017   Procedure: TRANSESOPHAGEAL ECHOCARDIOGRAM (TEE);  Surgeon: Lelon Perla, MD;  [facility];   Service: Cardiovascular;  Laterality: N/A;   Social History   Tobacco Use  . Smoking status: Former Smoker    Types: Cigarettes    Last attempt to quit: 09/2016    Years since quitting: 2.4  . Smokeless tobacco: Never Used  . Tobacco comment: very rare, social  Substance Use Topics  . Alcohol use: Yes    Alcohol/week: 2.0 standard drinks    Types: 2 Cans of beer per week    Comment: social   family history includes Colon cancer in her maternal grandmother; Diabetes in her maternal grandmother; Hypertension in her maternal grandmother; Stroke in her paternal grandfather.    ROS: negative except as noted in the HPI  Medications: Current Outpatient Medications  Medication Sig Dispense Refill  . Multiple Vitamin (MULTIVITAMIN WITH MINERALS) TABS tablet Take 1 tablet by mouth daily.    Marland Kitchen POTASSIUM PO Take by mouth.    Marland Kitchen amitriptyline (ELAVIL) 25 MG tablet Take 2 tablets (50 mg total) by mouth at bedtime. 180 tablet 1  . aspirin 325 MG tablet Take 1 tablet (325 mg total) by mouth daily.    Marland Kitchen  atorvastatin (LIPITOR) 10 MG tablet Take 1 tablet (10 mg total) by mouth at bedtime. 90 tablet 3  . carbamazepine (TEGRETOL) 200 MG tablet Take 1 tablet (200 mg total) by mouth at bedtime. 90 tablet 1  . Omega-3 Fatty Acids (FISH OIL ADULT GUMMIES PO) Take by mouth.    . Sod Picosulfate-Mag Ox-Cit Acd (CLENPIQ) 10-3.5-12 MG-GM -GM/160ML SOLN Take 1 kit by mouth as directed. 2 Bottle 0  . valsartan-hydrochlorothiazide (DIOVAN-HCT) 160-12.5 MG tablet Take 1 tablet by mouth daily. 90 tablet 1   No current facility-administered medications for this visit.    Allergies  Allergen Reactions  . Penicillins Hives and Other (See Comments)    Nightmares Has patient had a PCN reaction causing immediate rash, facial/tongue/throat swelling, SOB or lightheadedness with hypotension: Yes Has patient had a PCN reaction causing severe rash involving mucus membranes or skin necrosis: No Has patient had a PCN  reaction that required hospitalization pt was in the hospital at time of reaction Has patient had a PCN reaction occurring within the last 10 years: No If all of the above answers are "NO", then may proceed with Cephalosporin use.  . Fish Allergy Itching and Swelling    Lymph node swelling  . Pineapple Swelling  . Wheat Bran Swelling  . Other Itching and Swelling    Reaction to blue cheese Lymph node swelling  . Shrimp [Shellfish Allergy] Itching and Swelling    Lymph node swelling  . Yeast-Related Products Itching and Swelling    Lymph node swelling       Objective:  BP 121/83   Pulse 89   Wt 132 lb (59.9 kg)   BMI 26.66 kg/m  Gen:  alert, not ill-appearing, no distress, appropriate for age 45: head normocephalic without obvious abnormality, conjunctiva and cornea clear, trachea midline Pulm: Normal work of breathing, normal phonation, clear to auscultation bilaterally, no wheezes, rales or rhonchi CV: Normal rate, regular rhythm, s1 and s2 distinct, no murmurs, clicks or rubs  Neuro: alert and oriented x 3, no tremor MSK: extremities atraumatic, normal gait and station, no peripheral edema Skin: intact, no rashes on exposed skin, no jaundice, no cyanosis Psych: well-groomed, cooperative, good eye contact, "anxious" mood, affect mood-congruent, speech is articulate, and thought processes clear and goal-directed    No results found for this or any previous visit (from the past 72 hour(s)). No results found.    Assessment and Plan: 53 y.o. female with   .[name] was seen today for results.  Diagnoses and all orders for this visit:  Hypokalemia due to loss of potassium -     BASIC METABOLIC PANEL WITH GFR  Other headache syndrome -     carbamazepine (TEGRETOL) 200 MG tablet; Take 1 tablet (200 mg total) by mouth at bedtime. -     amitriptyline (ELAVIL) 25 MG tablet; Take 2 tablets (50 mg total) by mouth at bedtime.  Caregiver stress syndrome -     Ambulatory  referral to Psychology  Hypertension goal BP (blood pressure) < 130/80   Joylynn was referred to [facility] for counseling for caregiver stress  Refilled medications for chronic headaches/TN. She was also provided with contact info for PA Fox at [facility] regarding her Roselyn Meier prescription  Recheck potassium level today  Patient education and anticipatory guidance given Patient agrees with treatment plan Follow-up in 1 month for CPE with Pap smear or sooner as needed if symptoms worsen or fail to improve  Darlyne Russian PA-C

## 2019-02-13 NOTE — Patient Instructions (Addendum)
Ashley Beard prescription [facility] [address] [place]  [place], [place] [address]  [phone]     Hypokalemia Hypokalemia means that the amount of potassium in the blood is lower than normal.Potassium is a chemical that helps regulate the amount of fluid in the body (electrolyte). It also stimulates muscle tightening (contraction) and helps nerves work properly.Normally, most of the body's potassium is inside of cells, and only a very small amount is in the blood. Because the amount in the blood is so small, minor changes to potassium levels in the blood can be life-threatening. What are the causes? This condition may be caused by:  Antibiotic medicine.  Diarrhea or vomiting. Taking too much of a medicine that helps you have a bowel movement (laxative) can cause diarrhea and lead to hypokalemia.  Chronic kidney disease (CKD).  Medicines that help the body get rid of excess fluid (diuretics).  Eating disorders, such as bulimia.  Low magnesium levels in the body.  Sweating a lot. What are the signs or symptoms? Symptoms of this condition include:  Weakness.  Constipation.  Fatigue.  Muscle cramps.  Mental confusion.  Skipped heartbeats or irregular heartbeat (palpitations).  Tingling or numbness. How is this diagnosed? This condition is diagnosed with a blood test. How is this treated? Hypokalemia can be treated by taking potassium supplements by mouth or adjusting the medicines that you take. Treatment may also include eating more foods that contain a lot of potassium. If your potassium level is very low, you may need to get potassium through an IV tube in one of your veins and be monitored in the hospital. Follow these instructions at home:   Take over-the-counter and prescription medicines only as told by your health care provider. This includes vitamins and supplements.  Eat a healthy diet. A healthy diet  includes fresh fruits and vegetables, whole grains, healthy fats, and lean proteins.  If instructed, eat more foods that contain a lot of potassium, such as: ? Nuts, such as peanuts and pistachios. ? Seeds, such as sunflower seeds and pumpkin seeds. ? Peas, lentils, and lima beans. ? Whole grain and bran cereals and breads. ? Fresh fruits and vegetables, such as apricots, avocado, bananas, cantaloupe, kiwi, oranges, tomatoes, asparagus, and potatoes. ? Orange juice. ? Tomato juice. ? Red meats. ? Yogurt.  Keep all follow-up visits as told by your health care provider. This is important. Contact a health care provider if:  You have weakness that gets worse.  You feel your heart pounding or racing.  You vomit.  You have diarrhea.  You have diabetes (diabetes mellitus) and you have trouble keeping your blood sugar (glucose) in your target range. Get help right away if:  You have chest pain.  You have shortness of breath.  You have vomiting or diarrhea that lasts for more than 2 days.  You faint. This information is not intended to replace advice given to you by your health care provider. Make sure you discuss any questions you have with your health care provider. Document Released: 11/23/2005 Document Revised: 07/11/2016 Document Reviewed: 07/11/2016 Elsevier Interactive Patient Education  2019 [employer].

## 2019-02-14 LAB — BASIC METABOLIC PANEL WITH GFR
BUN: 16 mg/dL (ref 7–25)
CO2: 28 mmol/L (ref 20–32)
Calcium: 8.9 mg/dL (ref 8.6–10.4)
Chloride: 107 mmol/L (ref 98–110)
Creat: 0.95 mg/dL (ref 0.50–1.05)
GFR, Est African American: 80 mL/min/{1.73_m2} (ref >=60)
GFR, Est Non African American: 69 mL/min/{1.73_m2} (ref >=60)
Glucose, Bld: 99 mg/dL (ref 65–99)
Potassium: 3.4 mmol/L — ABNORMAL LOW (ref 3.5–5.3)
Sodium: 143 mmol/L (ref 135–146)

## 2019-02-16 ENCOUNTER — Other Ambulatory Visit: Payer: Self-pay | Admitting: Physician Assistant

## 2019-02-16 DIAGNOSIS — E876 Hypokalemia: Secondary | ICD-10-CM

## 2019-02-16 MED ORDER — POTASSIUM CHLORIDE ER 10 MEQ PO TBCR: 10.0000 meq | EXTENDED_RELEASE_TABLET | Freq: Every day | ORAL | 0 refills | Status: DC

## 2019-02-16 NOTE — Progress Notes (Signed)
Potassium level is still slightly low, but stable compared to 5 months ago. Add like you to stop your current over-the-counter potassium supplement and start the prescription potassium 10 mEq daily. Let us recheck your potassium level in 1 month

## 2019-02-19 ENCOUNTER — Encounter: Payer: Self-pay | Admitting: Physician Assistant

## 2019-02-19 DIAGNOSIS — I10 Essential (primary) hypertension: Secondary | ICD-10-CM

## 2019-02-20 MED ORDER — VALSARTAN-HYDROCHLOROTHIAZIDE 160-12.5 MG PO TABS: 1.0000 | ORAL_TABLET | Freq: Every day | ORAL | 0 refills | Status: DC

## 2019-02-22 ENCOUNTER — Other Ambulatory Visit: Payer: Self-pay | Admitting: Physician Assistant

## 2019-02-23 ENCOUNTER — Encounter: Payer: Self-pay | Admitting: Physician Assistant

## 2019-03-13 ENCOUNTER — Encounter: Payer: BLUE CROSS/BLUE SHIELD | Admitting: Physician Assistant

## 2019-03-23 ENCOUNTER — Other Ambulatory Visit: Payer: Self-pay | Admitting: Physician Assistant

## 2019-03-23 DIAGNOSIS — I10 Essential (primary) hypertension: Secondary | ICD-10-CM

## 2019-03-24 ENCOUNTER — Encounter: Payer: Self-pay | Admitting: Physician Assistant

## 2019-04-03 ENCOUNTER — Encounter: Payer: Self-pay | Admitting: Physician Assistant

## 2019-04-05 DIAGNOSIS — F419 Anxiety disorder, unspecified: Secondary | ICD-10-CM | POA: Diagnosis not present

## 2019-04-05 DIAGNOSIS — F329 Major depressive disorder, single episode, unspecified: Secondary | ICD-10-CM | POA: Diagnosis not present

## 2019-04-05 DIAGNOSIS — G43709 Chronic migraine without aura, not intractable, without status migrainosus: Secondary | ICD-10-CM | POA: Diagnosis not present

## 2019-04-26 IMAGING — MG DIGITAL DIAGNOSTIC BILATERAL MAMMOGRAM WITH TOMO AND CAD
8 series · 8 of 24 positions shown · non-contrast
Comparison: Previous exam(s).

CLINICAL DATA: Short-term follow-up for probably benign masses in
left breast. These were initially evaluated in September 2017.

EXAM:
DIGITAL DIAGNOSTIC BILATERAL MAMMOGRAM WITH CAD AND TOMO
ULTRASOUND LEFT BREAST

[L MLO synth-2D]
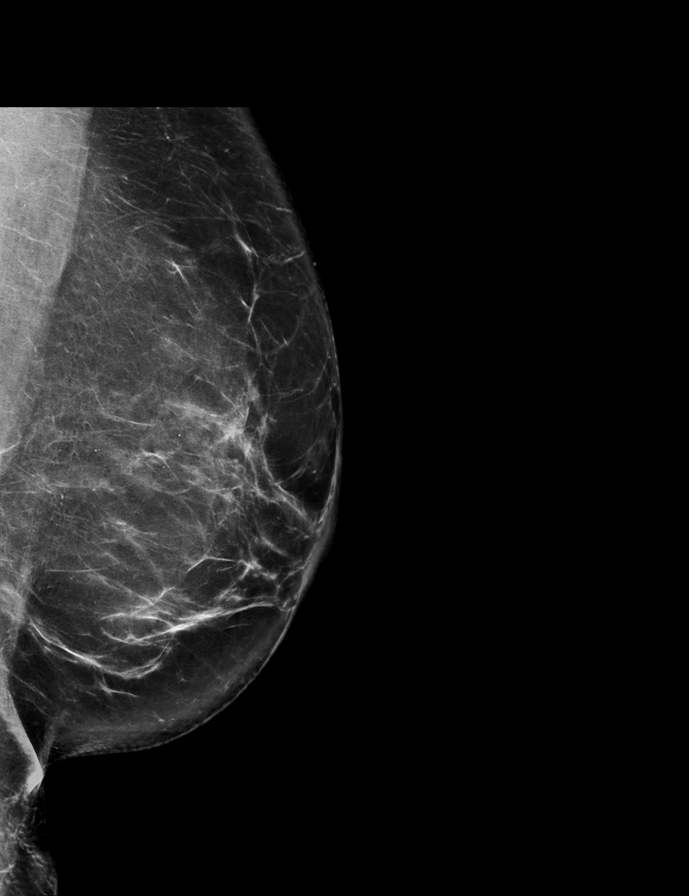

[L CC synth-2D]
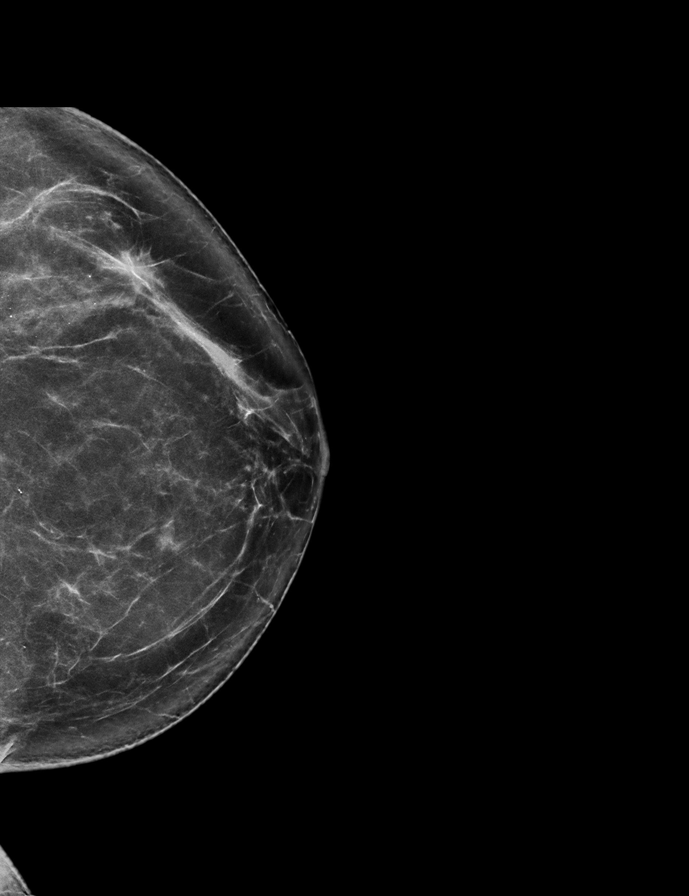

[R CC synth-2D]
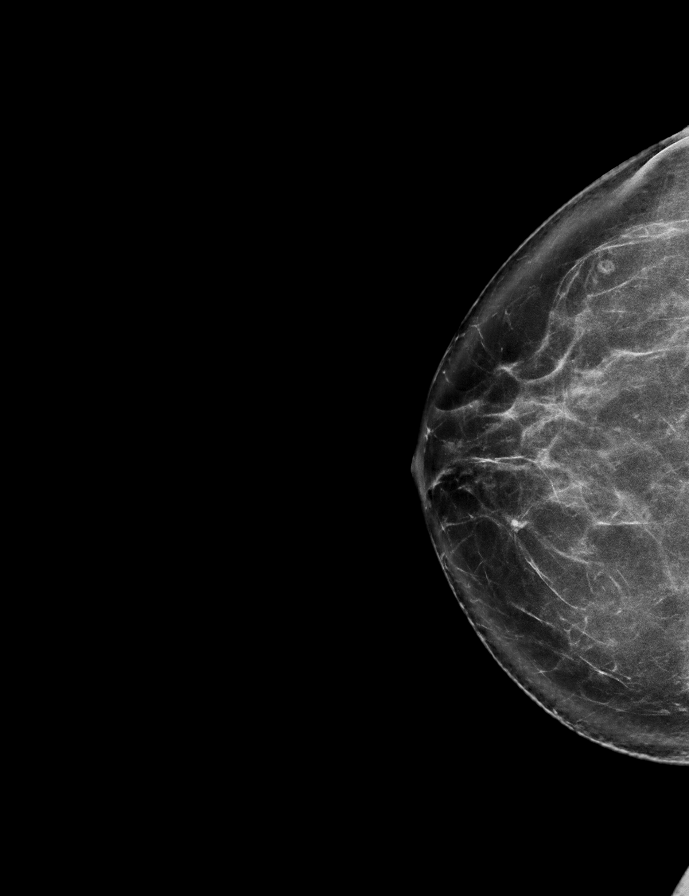

[R MLO synth-2D]
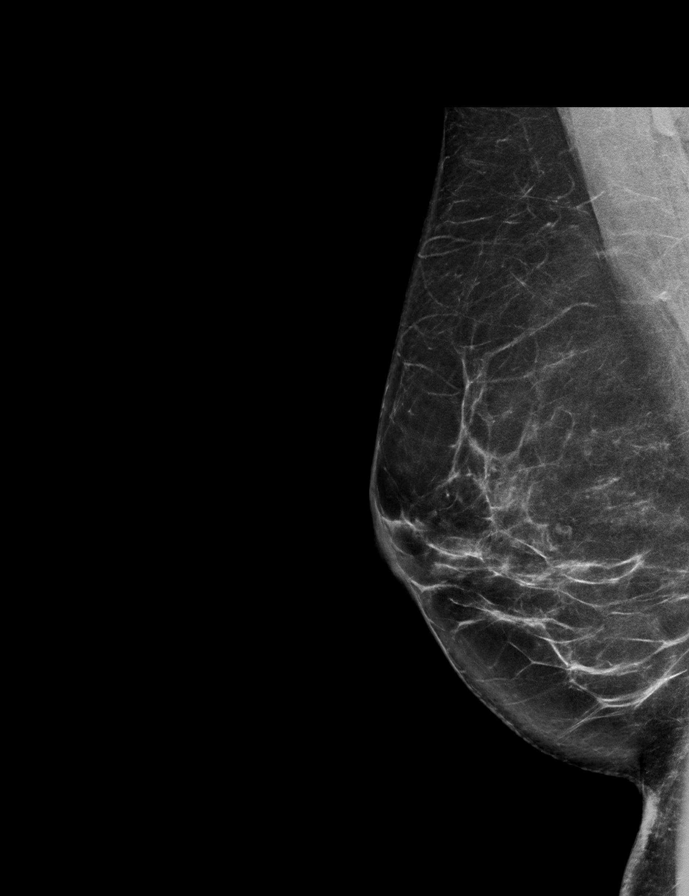

[L MLO tomo · tomo slice 44/87.0]
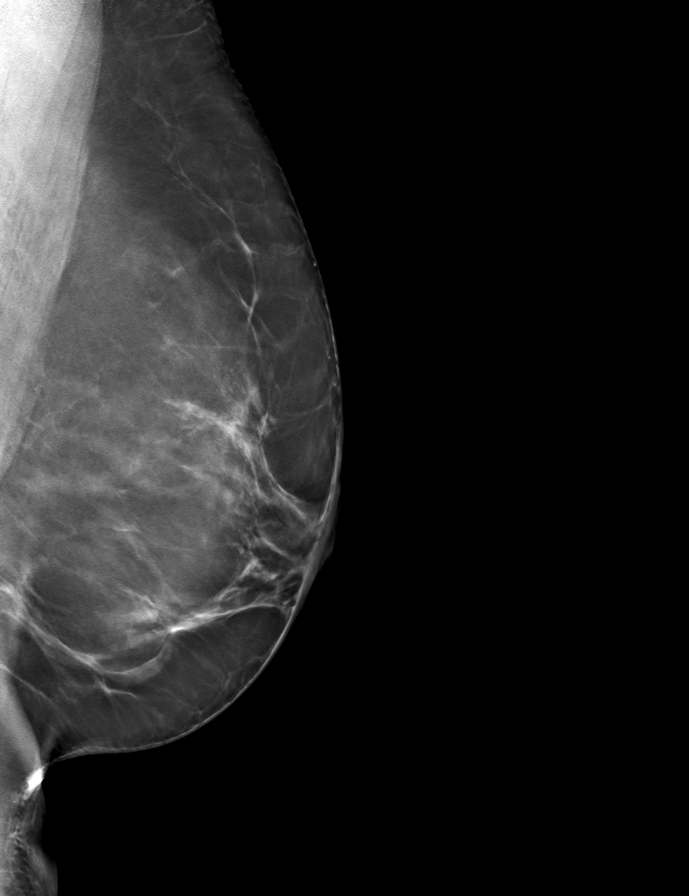

[R CC tomo · tomo slice 42/83.0]
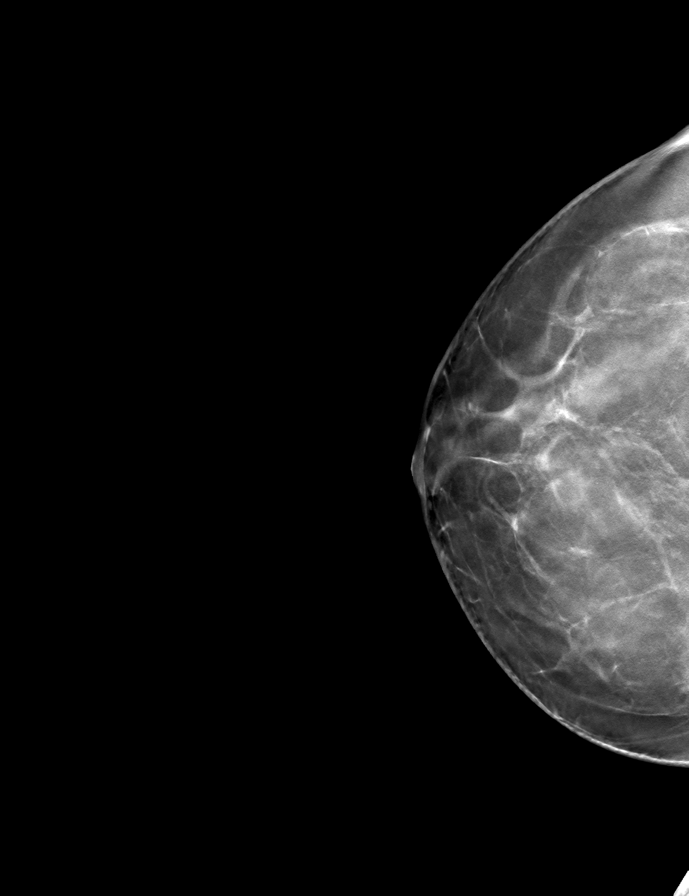

[L CC tomo · tomo slice 39/76.0]
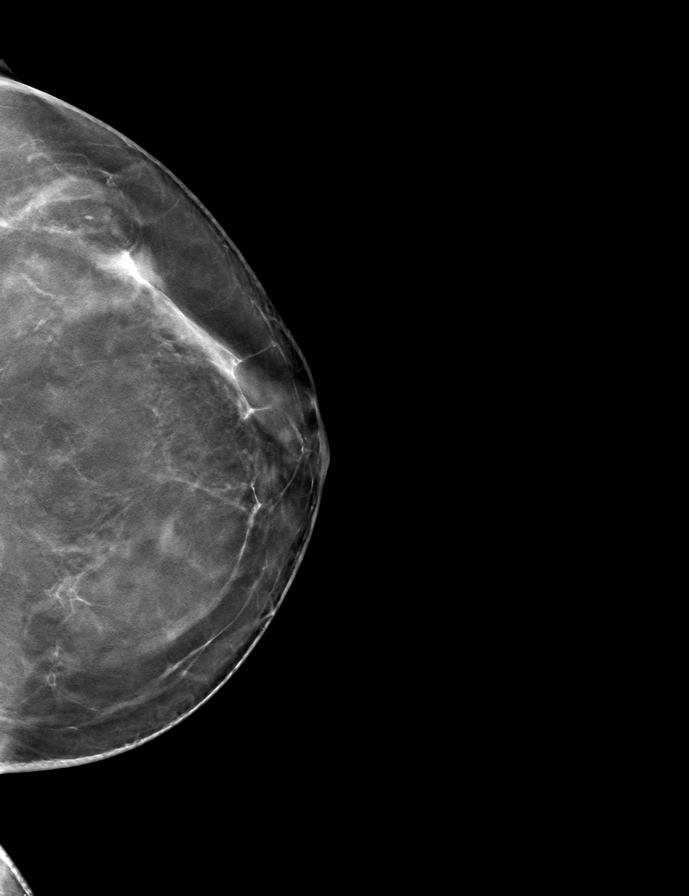

[R MLO tomo · tomo slice 41/81.0]
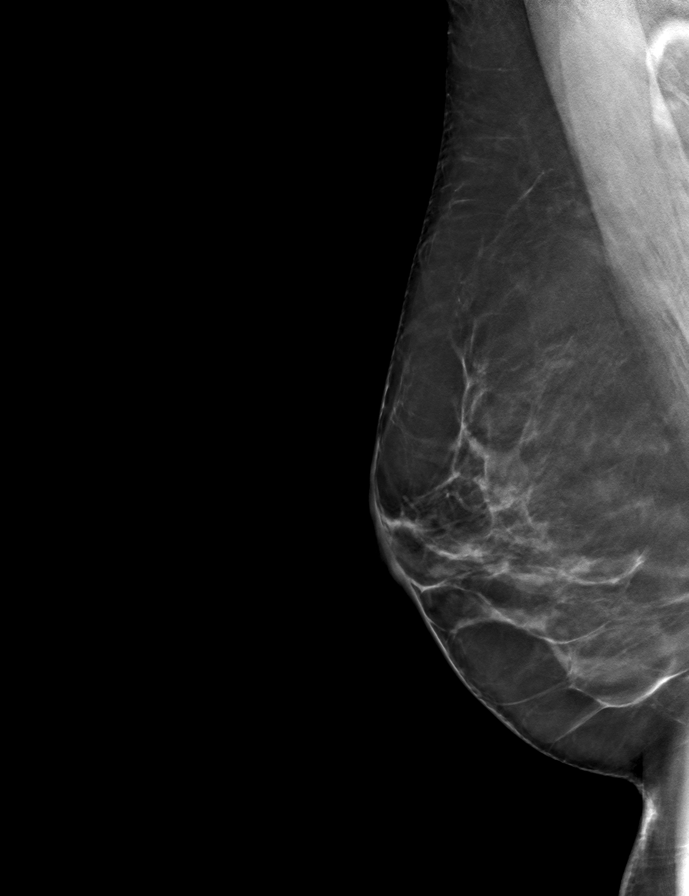

[8 of 24 positions shown; findings below may reference images not displayed]

ACR Breast Density Category b: There are scattered areas of
fibroglandular density.
FINDINGS: There are small, oval, mostly circumscribed masses in the lateral
left breast, stable from the prior exams.

There are no new masses. There are no areas of architectural
distortion and there are no new or suspicious calcifications.

Mammographic images were processed with CAD.

Targeted ultrasound is performed, showing several small hypo to
anechoic circumscribed masses in the left breast at 3 o'clock. In
the 3 o'clock position, 5 cm the nipple, there is an oval
circumscribed hypo to anechoic mass measuring 7 x 3 x 5 mm. Adjacent
to this, also 3 o'clock, 5 cm the nipple, is a similar-appearing
hypo to anechoic circumscribed oval mass measuring 6 x 3 x 7 mm.
There are several 2-3 mm adjacent similar appearing hypo to anechoic
circumscribed masses. These are all stable from the prior exams.
IMPRESSION: 1. Probably benign masses in the lateral left breast, most likely
small mildly complicated cysts. These have been stable for 1 year.
2. No new abnormalities.

RECOMMENDATION:
Diagnostic mammography with left breast ultrasound in 1 year to
provide 2 years of stability the probably benign small lateral left
breast masses.

I have discussed the findings and recommendations with the patient.
Results were also provided in writing at the conclusion of the
visit. If applicable, a reminder letter will be sent to the patient
regarding the next appointment.

BI-RADS CATEGORY  3: Probably benign.

## 2019-05-11 ENCOUNTER — Encounter: Payer: Self-pay | Admitting: Physician Assistant

## 2019-05-12 ENCOUNTER — Other Ambulatory Visit: Payer: Self-pay

## 2019-05-12 DIAGNOSIS — I10 Essential (primary) hypertension: Secondary | ICD-10-CM

## 2019-05-12 DIAGNOSIS — E785 Hyperlipidemia, unspecified: Secondary | ICD-10-CM

## 2019-05-12 DIAGNOSIS — Z8673 Personal history of transient ischemic attack (TIA), and cerebral infarction without residual deficits: Secondary | ICD-10-CM

## 2019-05-12 MED ORDER — ATORVASTATIN CALCIUM 10 MG PO TABS: 10.0000 mg | ORAL_TABLET | Freq: Every day | ORAL | 0 refills | Status: DC

## 2019-05-12 MED ORDER — VALSARTAN-HYDROCHLOROTHIAZIDE 160-12.5 MG PO TABS: 1.0000 | ORAL_TABLET | Freq: Every day | ORAL | 0 refills | Status: DC

## 2019-05-20 ENCOUNTER — Other Ambulatory Visit: Payer: Self-pay | Admitting: Physician Assistant

## 2019-05-20 DIAGNOSIS — E876 Hypokalemia: Secondary | ICD-10-CM

## 2019-06-07 ENCOUNTER — Encounter: Payer: Self-pay | Admitting: Physician Assistant

## 2019-06-07 ENCOUNTER — Ambulatory Visit (INDEPENDENT_AMBULATORY_CARE_PROVIDER_SITE_OTHER): Payer: BC Managed Care – PPO | Admitting: Physician Assistant

## 2019-06-07 DIAGNOSIS — G500 Trigeminal neuralgia: Secondary | ICD-10-CM | POA: Diagnosis not present

## 2019-06-07 DIAGNOSIS — F418 Other specified anxiety disorders: Secondary | ICD-10-CM

## 2019-06-07 DIAGNOSIS — Z0289 Encounter for other administrative examinations: Secondary | ICD-10-CM

## 2019-06-07 DIAGNOSIS — IMO0002 Reserved for concepts with insufficient information to code with codable children: Secondary | ICD-10-CM

## 2019-06-07 DIAGNOSIS — R457 State of emotional shock and stress, unspecified: Secondary | ICD-10-CM | POA: Diagnosis not present

## 2019-06-07 DIAGNOSIS — G43709 Chronic migraine without aura, not intractable, without status migrainosus: Secondary | ICD-10-CM | POA: Diagnosis not present

## 2019-06-07 NOTE — Progress Notes (Signed)
Virtual Visit via Video Note  I connected with [name] on 06/21/19 at  9:10 AM EDT by a video enabled telemedicine application and verified that I am speaking with the correct person using two identifiers.   I discussed the limitations of evaluation and management by telemedicine and the availability of in person appointments. The patient expressed understanding and agreed to proceed.  History of Present Illness: HPI:                                                                Ashley Beard is a 53 y.o. female   CC: FMLA, anxiety/chronic headaches  Lawson is followed by [facility] for chronic migraine. 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 For awhile her headaches appeared to be well controlled with Tegretol 200 mg and Amitriptyline 50 mg. She developed chronic migraine pattern in 2019 and began taking Topamax in October 2019. She titrated up to 100 mg nightly. She was given Rx for Ubrelvy for abortive therapy, but she was unable to afford the prescription. She takes full strength asa and baclofen as needed for headaches.  She admits she has been under increased stress related to personal issues with her daughter. At last tele-visit with neuro she was started on Paxil 20 mg on 04/05/19 with option to self-titrate to 2 tabs if needed. She reports medication makes her jittery at first, but she takes 1 tab at night and is able to fall asleep. She thinks it is helping "some."  She reports she does have some intermittent cognitive problems and she woke up one day and didn't know what day it was.   Past Medical History:  Diagnosis Date  . Abnormal Pap smear of cervix    age 32  . Anxiety   . Carotid arterial disease ([place])   . Headache   . Meningioma, cerebral ([place]) 08/18/2018   5 x 12 mm meningioma left middle cranial fossa similar in size to the prior MRI of 02/25/2017  . Refusal of blood transfusions as patient is Jehovah's Witness   .  Stroke ([place])   . Trigeminal neuralgia 06/22/2017   Dx by neuro. Takes Tegretol prn   Past Surgical History:  Procedure Laterality Date  . ENDARTERECTOMY Left 02/24/2017   Procedure: ENDARTERECTOMY CAROTID with removal of thrombus ;  Surgeon: Rosetta Posner, MD;  Location: [facility];  Service: Vascular;  Laterality: Left;  . GYNECOLOGIC CRYOSURGERY     age 60  . IR GENERIC HISTORICAL  02/25/2017   IR ANGIO VERTEBRAL SEL SUBCLAVIAN INNOMINATE UNI R MOD SED 02/25/2017 [name], MD [facility]  . IR GENERIC HISTORICAL  02/25/2017   IR ANGIO INTRA EXTRACRAN SEL COM CAROTID INNOMINATE BILAT MOD SED 02/25/2017 [name], MD [facility]  . OOPHORECTOMY    . TEE WITHOUT CARDIOVERSION N/A 05/04/2017   Procedure: TRANSESOPHAGEAL ECHOCARDIOGRAM (TEE);  Surgeon: Lelon Perla, MD;  Location: [facility] ENDOSCOPY;  Service: Cardiovascular;  Laterality: N/A;   Social History   Tobacco Use  . Smoking status: Former Smoker    Types: Cigarettes    Quit date: 09/2016    Years since quitting: 2.7  . Smokeless tobacco: Never Used  . Tobacco comment: very rare, social  Substance  Use Topics  . Alcohol use: Yes    Alcohol/week: 2.0 standard drinks    Types: 2 Cans of beer per week    Comment: social   family history includes Colon cancer in her maternal grandmother; Diabetes in her maternal grandmother; Hypertension in her maternal grandmother; Stroke in her paternal grandfather.    ROS: negative except as noted in the HPI  Medications: Current Outpatient Medications  Medication Sig Dispense Refill  . amitriptyline (ELAVIL) 25 MG tablet Take 2 tablets (50 mg total) by mouth at bedtime. 180 tablet 1  . aspirin 325 MG tablet Take 1 tablet (325 mg total) by mouth daily.    Marland Kitchen atorvastatin (LIPITOR) 10 MG tablet Take 1 tablet (10 mg total) by mouth at bedtime. 90 tablet 0  . baclofen (LIORESAL) 10 MG tablet Take by mouth.    . carbamazepine (TEGRETOL) 200 MG tablet Take 1 tablet (200 mg total)  by mouth at bedtime. 90 tablet 1  . Multiple Vitamin (MULTIVITAMIN WITH MINERALS) TABS tablet Take 1 tablet by mouth daily.    . Omega-3 Fatty Acids (FISH OIL ADULT GUMMIES PO) Take by mouth.    Marland Kitchen PARoxetine (PAXIL) 20 MG tablet Take 1 tablet by mouth daily.    . potassium chloride (K-DUR) 10 MEQ tablet Take 1 tablet (10 mEq total) by mouth daily. Due for follow up visit w/PCP 30 tablet 0  . Sod Picosulfate-Mag Ox-Cit Acd (CLENPIQ) 10-3.5-12 MG-GM -GM/160ML SOLN Take 1 kit by mouth as directed. 2 Bottle 0  . valsartan-hydrochlorothiazide (DIOVAN-HCT) 160-12.5 MG tablet Take 1 tablet by mouth daily. 90 tablet 0  . topiramate (TOPAMAX) 100 MG tablet Take 100 mg by mouth at bedtime.     No current facility-administered medications for this visit.    Allergies  Allergen Reactions  . Penicillins Hives and Other (See Comments)    Nightmares Has patient had a PCN reaction causing immediate rash, facial/tongue/throat swelling, SOB or lightheadedness with hypotension: Yes Has patient had a PCN reaction causing severe rash involving mucus membranes or skin necrosis: No Has patient had a PCN reaction that required hospitalization pt was in the hospital at time of reaction Has patient had a PCN reaction occurring within the last 10 years: No If all of the above answers are "NO", then may proceed with Cephalosporin use.  . Fish Allergy Itching and Swelling    Lymph node swelling  . Pineapple Swelling  . Wheat Bran Swelling  . Other Itching and Swelling    Reaction to blue cheese Lymph node swelling  . Shrimp [Shellfish Allergy] Itching and Swelling    Lymph node swelling  . Yeast-Related Products Itching and Swelling    Lymph node swelling       Objective:  There were no vitals taken for this visit.  BP Readings from Last 3 Encounters:  02/13/19 121/83  02/06/19 124/83  12/14/18 122/83   Pulse Readings from Last 3 Encounters:  02/13/19 89  02/06/19 88  12/14/18 (!) 108   Wt  Readings from Last 3 Encounters:  02/13/19 132 lb (59.9 kg)  02/06/19 131 lb (59.4 kg)  12/14/18 136 lb (61.7 kg)    Gen:  alert, not ill-appearing, no distress, appropriate for age HEENT: head normocephalic without obvious abnormality, conjunctiva and cornea clear, trachea midline Pulm: Normal work of breathing, normal phonation Neuro: alert and oriented x 3 Psych: cooperative, euthymic mood, affect mood-congruent, speech is articulate, normal rate and volume; thought processes clear and goal-directed, normal judgment, good insight  No results found for this or any previous visit (from the past 2160 hour(s)).  Lab Results  Component Value Date   CREATININE 0.95 02/13/2019   BUN 16 02/13/2019   NA 143 02/13/2019   K 3.4 (L) 02/13/2019   CL 107 02/13/2019   CO2 28 02/13/2019   Lab Results  Component Value Date   ALT 21 08/26/2018   AST 16 08/26/2018   ALKPHOS 64 04/02/2017   BILITOT 0.3 08/26/2018   Lab Results  Component Value Date   WBC 8.2 08/26/2018   HGB 13.4 08/26/2018   HCT 38.9 08/26/2018   MCV 92.2 08/26/2018   PLT 249 08/26/2018   Lab Results  Component Value Date   CHOL 218 (H) 08/26/2018   HDL 85 08/26/2018   LDLCALC 112 (H) 08/26/2018   TRIG 104 08/26/2018   CHOLHDL 2.6 08/26/2018    Assessment and Plan: 53 y.o. female with   .Diagnoses and all orders for this visit:  Encounter for completion of form with patient  Chronic migraine  Trigeminal neuralgia   FMLA forms completed today Counseled patient that Topamax may be causing cognitive symptoms and to discuss CGRP blocker with Neuro Counseled to contact Neuro regarding PA for Ubrelvy or discuss Nurtec as an alternative Counseled patient that Elavil and Paxil may be interacting and to discuss discontinuing Elavil since this is not likely helping for headache prevention or alternatively taper off of Paxil and increase Amitriptyline for mood/anxiety  Follow-up in office for  HTN/anxiety/medication management in 2-3 months or sooner as needed  Follow Up Instructions:    I discussed the assessment and treatment plan with the patient. The patient was provided an opportunity to ask questions and all were answered. The patient agreed with the plan and demonstrated an understanding of the instructions.   The patient was advised to call back or seek an in-person evaluation if the symptoms worsen or if the condition fails to improve as anticipated.  I provided 15 minutes of non-face-to-face time during this encounter.   Trixie Dredge, [place]

## 2019-06-11 ENCOUNTER — Encounter: Payer: Self-pay | Admitting: Physician Assistant

## 2019-06-11 ENCOUNTER — Telehealth: Payer: Self-pay | Admitting: Physician Assistant

## 2019-06-11 NOTE — Telephone Encounter (Signed)
Intermittent FMLA completed  Please send photocopy to scan Please fax to [phone] Please contact patient when completed and leave hardcopy for patient at the front desk

## 2019-06-12 NOTE — Telephone Encounter (Signed)
Faxed and sent to scan -EH/RMA

## 2019-06-21 ENCOUNTER — Encounter: Payer: Self-pay | Admitting: Physician Assistant

## 2019-06-21 DIAGNOSIS — F32A Depression, unspecified: Secondary | ICD-10-CM | POA: Insufficient documentation

## 2019-06-21 DIAGNOSIS — F418 Other specified anxiety disorders: Secondary | ICD-10-CM | POA: Insufficient documentation

## 2019-07-05 ENCOUNTER — Telehealth: Payer: Self-pay | Admitting: Physician Assistant

## 2019-07-05 DIAGNOSIS — G43709 Chronic migraine without aura, not intractable, without status migrainosus: Secondary | ICD-10-CM | POA: Diagnosis not present

## 2019-07-05 NOTE — Telephone Encounter (Signed)
FMLA forms completed for daughter Charlesetta Shanks), scanned to chart and are ready for pick-up Forms require daughter's signature for completion before they can be faxed. Deadline is 07/10/19 Forms are in yellow folder in Evoni's inbasket

## 2019-07-06 ENCOUNTER — Ambulatory Visit: Payer: BLUE CROSS/BLUE SHIELD | Admitting: Physician Assistant

## 2019-07-07 NOTE — Telephone Encounter (Signed)
Left recommendations on vm -EH/RMA

## 2019-08-03 ENCOUNTER — Other Ambulatory Visit: Payer: Self-pay | Admitting: *Deleted

## 2019-08-03 ENCOUNTER — Encounter: Payer: Self-pay | Admitting: Physician Assistant

## 2019-08-03 DIAGNOSIS — E876 Hypokalemia: Secondary | ICD-10-CM

## 2019-08-03 MED ORDER — POTASSIUM CHLORIDE ER 10 MEQ PO TBCR: 10.0000 meq | EXTENDED_RELEASE_TABLET | Freq: Every day | ORAL | 0 refills | Status: DC

## 2019-08-03 MED ORDER — BACLOFEN 10 MG PO TABS: 10.0000 mg | ORAL_TABLET | Freq: Three times a day (TID) | ORAL | 3 refills | Status: AC | PRN

## 2019-08-03 NOTE — Telephone Encounter (Signed)
Are you ok filling this hasn't had since 2019?

## 2019-08-09 ENCOUNTER — Other Ambulatory Visit: Payer: Self-pay | Admitting: Neurosurgery

## 2019-08-09 DIAGNOSIS — D329 Benign neoplasm of meninges, unspecified: Secondary | ICD-10-CM

## 2019-08-12 ENCOUNTER — Other Ambulatory Visit: Payer: Self-pay | Admitting: Physician Assistant

## 2019-08-12 DIAGNOSIS — E785 Hyperlipidemia, unspecified: Secondary | ICD-10-CM

## 2019-08-12 DIAGNOSIS — Z8673 Personal history of transient ischemic attack (TIA), and cerebral infarction without residual deficits: Secondary | ICD-10-CM

## 2019-10-11 ENCOUNTER — Emergency Department (HOSPITAL_COMMUNITY): Payer: BC Managed Care – PPO

## 2019-10-11 ENCOUNTER — Other Ambulatory Visit: Payer: Self-pay

## 2019-10-11 ENCOUNTER — Emergency Department (HOSPITAL_COMMUNITY)
Admission: EM | Admit: 2019-10-11 | Discharge: 2019-10-11 | Disposition: A | Discharge status: Home / Self Care | Payer: BC Managed Care – PPO | Attending: Emergency Medicine | Admitting: Emergency Medicine

## 2019-10-11 ENCOUNTER — Encounter (HOSPITAL_COMMUNITY): Payer: Self-pay | Admitting: Emergency Medicine

## 2019-10-11 DIAGNOSIS — Z79899 Other long term (current) drug therapy: Secondary | ICD-10-CM | POA: Insufficient documentation

## 2019-10-11 DIAGNOSIS — R479 Unspecified speech disturbances: Secondary | ICD-10-CM | POA: Diagnosis not present

## 2019-10-11 DIAGNOSIS — G8929 Other chronic pain: Secondary | ICD-10-CM | POA: Insufficient documentation

## 2019-10-11 DIAGNOSIS — R42 Dizziness and giddiness: Secondary | ICD-10-CM | POA: Diagnosis not present

## 2019-10-11 DIAGNOSIS — R519 Headache, unspecified: Secondary | ICD-10-CM

## 2019-10-11 DIAGNOSIS — Z7982 Long term (current) use of aspirin: Secondary | ICD-10-CM | POA: Insufficient documentation

## 2019-10-11 DIAGNOSIS — Z8673 Personal history of transient ischemic attack (TIA), and cerebral infarction without residual deficits: Secondary | ICD-10-CM | POA: Diagnosis not present

## 2019-10-11 DIAGNOSIS — Z87891 Personal history of nicotine dependence: Secondary | ICD-10-CM | POA: Insufficient documentation

## 2019-10-11 DIAGNOSIS — D329 Benign neoplasm of meninges, unspecified: Secondary | ICD-10-CM | POA: Diagnosis not present

## 2019-10-11 DIAGNOSIS — I2510 Atherosclerotic heart disease of native coronary artery without angina pectoris: Secondary | ICD-10-CM | POA: Diagnosis not present

## 2019-10-11 DIAGNOSIS — G43709 Chronic migraine without aura, not intractable, without status migrainosus: Secondary | ICD-10-CM | POA: Diagnosis not present

## 2019-10-11 LAB — COMPREHENSIVE METABOLIC PANEL
ALT: 33 U/L (ref 0–44)
AST: 19 U/L (ref 15–41)
Albumin: 4 g/dL (ref 3.5–5.0)
Alkaline Phosphatase: 97 U/L (ref 38–126)
Anion gap: 10 (ref 5–15)
BUN: 15 mg/dL (ref 6–20)
CO2: 22 mmol/L (ref 22–32)
Calcium: 9.4 mg/dL (ref 8.9–10.3)
Chloride: 107 mmol/L (ref 98–111)
Creatinine, Ser: 1.01 mg/dL — ABNORMAL HIGH (ref 0.44–1.00)
GFR calc Af Amer: >60 mL/min (ref >60)
GFR calc non Af Amer: >60 mL/min (ref >60)
Glucose, Bld: 76 mg/dL (ref 70–99)
Potassium: 3.9 mmol/L (ref 3.5–5.1)
Sodium: 139 mmol/L (ref 135–145)
Total Bilirubin: 0.1 mg/dL — ABNORMAL LOW (ref 0.3–1.2)
Total Protein: 7.7 g/dL (ref 6.5–8.1)

## 2019-10-11 LAB — DIFFERENTIAL
Abs Immature Granulocytes: 0.04 10*3/uL (ref 0.00–0.07)
Basophils Absolute: 0 10*3/uL (ref 0.0–0.1)
Basophils Relative: 0 %
Eosinophils Absolute: 0 10*3/uL (ref 0.0–0.5)
Eosinophils Relative: 0 %
Immature Granulocytes: 1 %
Lymphocytes Relative: 19 %
Lymphs Abs: 1.7 10*3/uL (ref 0.7–4.0)
Monocytes Absolute: 0.5 10*3/uL (ref 0.1–1.0)
Monocytes Relative: 5 %
Neutro Abs: 6.7 10*3/uL (ref 1.7–7.7)
Neutrophils Relative %: 75 %

## 2019-10-11 LAB — CBC
HCT: 43.7 % (ref 36.0–46.0)
Hemoglobin: 14.6 g/dL (ref 12.0–15.0)
MCH: 32.2 pg (ref 26.0–34.0)
MCHC: 33.4 g/dL (ref 30.0–36.0)
MCV: 96.3 fL (ref 80.0–100.0)
Platelets: 260 10*3/uL (ref 150–400)
RBC: 4.54 MIL/uL (ref 3.87–5.11)
RDW: 12.6 % (ref 11.5–15.5)
WBC: 8.9 10*3/uL (ref 4.0–10.5)
nRBC: 0 % (ref 0.0–0.2)

## 2019-10-11 LAB — PROTIME-INR
INR: 1 (ref 0.8–1.2)
Prothrombin Time: 13.4 seconds (ref 11.4–15.2)

## 2019-10-11 LAB — APTT: aPTT: 30 seconds (ref 24–36)

## 2019-10-11 LAB — I-STAT BETA HCG BLOOD, ED (MC, WL, AP ONLY): I-stat hCG, quantitative: <5 m[IU]/mL (ref <5)

## 2019-10-11 MED ORDER — LORAZEPAM 2 MG/ML IJ SOLN
1.0000 mg | Freq: Once | INTRAMUSCULAR | Status: AC
  Administered 2019-10-11: 1 mg via INTRAVENOUS
  Filled 2019-10-11: qty 1

## 2019-10-11 MED ORDER — SODIUM CHLORIDE 0.9% FLUSH: 3.0000 mL | Freq: Once | INTRAVENOUS | Status: DC

## 2019-10-11 MED ORDER — KETOROLAC TROMETHAMINE 15 MG/ML IJ SOLN
15.0000 mg | Freq: Once | INTRAMUSCULAR | Status: AC
  Administered 2019-10-11: 15 mg via INTRAVENOUS
  Filled 2019-10-11: qty 1

## 2019-10-11 NOTE — Discharge Instructions (Signed)
Follow closely with your neurologist for further evaluation management of your symptoms. Continue taking her home medications as prescribed. Return to the emergency room if you develop fevers, recurrence of speech difficulties, numbness/weakness, or any new, worsening, or concerning symptoms.

## 2019-10-11 NOTE — ED Notes (Signed)
Patient transported to MRI 

## 2019-10-11 NOTE — ED Triage Notes (Signed)
C/o feeling lightheaded, dizziness, and not knowing where she was yesterday while at work.  Also reports L sided headache that she states is constant.  No arm drift.  Reports no confusion today.  History of stroke in 2018.

## 2019-10-11 NOTE — ED Notes (Signed)
Patient verbalizes understanding of discharge instructions. Opportunity for questioning and answers were provided. Armband removed by staff, pt discharged from ED ambulatory.   

## 2019-10-11 NOTE — ED Notes (Signed)
Sophia PA at bedside.  

## 2019-10-11 NOTE — ED Provider Notes (Signed)
[facility] EMERGENCY DEPARTMENT Provider Note   CSN: [id number] Arrival date & time: 10/11/19  9169     History   Chief Complaint Chief Complaint  Patient presents with  . Stroke Symptoms    HPI Ashley Beard is a 53 y.o. female presenting for evaluation of speech difficulty.   Patient states yesterday she had acute onset neurological symptoms.  She states he was having a difficult time understanding what people are saying.  She then was having on getting her words out.  She states her degree was on the screen, but not site.  She also felt dizzy at the time.  Symptoms lasted for about 30 minutes before resolving without intervention.  Patient has had a chronic left-sided headache which is been present since her stroke 2 years ago.  Over the past 2 days, her headache has felt different, with increased pressure as opposed pain.  Patient states 4 days ago she felt very tired and not like herself, stayed in bed for 2 days and this then resolved.  She denies fevers, chills, sore throat, chest pain, shortness breath, nausea, vomiting, abdominal pain, urinary symptoms, normal bowel movements.  Patient states she called her neurologist today who recommended she come to the ED for further evaluation for possible TIA for CVA versus other etiology.  Patient reports history of stroke 2 years ago.  This was presumed to be due to carotid disease, she had surgery for this.  Patient states she is on aspirin, no Plavix or other blood thinner.  Per chart review, she used to be on Plavix.  Additional history obtained from chart review, history of anxiety, carotid arterial disease, chronic headaches, meningioma, previous stroke, trigeminal neuralgia.  Patient is Jehovah's Witness, has refused blood transfusions.     HPI  Past Medical History:  Diagnosis Date  . Abnormal Pap smear of cervix    age 86  . Anxiety   . Carotid arterial disease ([place])   . Headache   . Meningioma, cerebral  ([place]) 08/18/2018   5 x 12 mm meningioma left middle cranial fossa similar in size to the prior MRI of 02/25/2017  . Refusal of blood transfusions as patient is Jehovah's Witness   . Stroke ([place])   . Trigeminal neuralgia 06/22/2017   Dx by neuro. Takes Tegretol prn    Patient Active Problem List   Diagnosis Date Noted  . Anxiety with depression 06/21/2019  . Caregiver stress syndrome 02/13/2019  . Chronic migraine 09/15/2018  . Hypokalemia due to loss of potassium 08/29/2018  . Meningioma, cerebral ([place]) 08/18/2018  . Colon cancer screening 06/25/2018  . Encounter for medication management 06/25/2018  . Other headache syndrome 06/13/2018  . Abnormal mammogram of left breast 10/06/2017  . Trigeminal neuralgia 06/22/2017  . Right homonymous hemianopsia 03/16/2017  . History of cerebrovascular accident (CVA) from left carotid artery occlusion involving left middle cerebral artery territory 02/26/2017  . Hypertension goal BP (blood pressure) < 130/80 02/22/2017  . Carotid artery disease ([place]) 02/22/2017    Past Surgical History:  Procedure Laterality Date  . ENDARTERECTOMY Left 02/24/2017   Procedure: ENDARTERECTOMY CAROTID with removal of thrombus ;  Surgeon: Rosetta Posner, MD;  Location: [facility];  Service: Vascular;  Laterality: Left;  . GYNECOLOGIC CRYOSURGERY     age 55  . IR GENERIC HISTORICAL  02/25/2017   IR ANGIO VERTEBRAL SEL SUBCLAVIAN INNOMINATE UNI R MOD SED 02/25/2017 [name], MD [facility]INTER[facility]  . IR GENERIC HISTORICAL  02/25/2017  IR ANGIO INTRA EXTRACRAN SEL COM CAROTID INNOMINATE BILAT MOD SED 02/25/2017 [name], MD [facility]  . OOPHORECTOMY    . TEE WITHOUT CARDIOVERSION N/A 05/04/2017   Procedure: TRANSESOPHAGEAL ECHOCARDIOGRAM (TEE);  Surgeon: Lelon Perla, MD;  Location: [facility] ENDOSCOPY;  Service: Cardiovascular;  Laterality: N/A;     OB History   No obstetric history on file.    Obstetric Comments  N/A         Home Medications     Prior to Admission medications   Medication Sig Start Date End Date Taking? Authorizing Provider  amitriptyline (ELAVIL) 25 MG tablet Take 2 tablets (50 mg total) by mouth at bedtime. 02/13/19   Trixie Dredge, PA-C  aspirin 325 MG tablet Take 1 tablet (325 mg total) by mouth daily. 09/05/18   Trixie Dredge, PA-C  atorvastatin (LIPITOR) 10 MG tablet Take 1 tablet (10 mg total) by mouth at bedtime. Due for labs 08/15/19   Trixie Dredge, PA-C  baclofen (LIORESAL) 10 MG tablet Take 1 tablet (10 mg total) by mouth 3 (three) times daily as needed for muscle spasms (headache, neck pain). 08/03/19 08/02/20  Trixie Dredge, PA-C  carbamazepine (TEGRETOL) 200 MG tablet Take 1 tablet (200 mg total) by mouth at bedtime. 02/13/19   Trixie Dredge, PA-C  Multiple Vitamin (MULTIVITAMIN WITH MINERALS) TABS tablet Take 1 tablet by mouth daily.    [provider]  Omega-3 Fatty Acids (FISH OIL ADULT GUMMIES PO) Take by mouth.    [provider]  PARoxetine (PAXIL) 20 MG tablet Take 1 tablet by mouth daily. 04/05/19   [provider]  potassium chloride (K-DUR) 10 MEQ tablet Take 1 tablet (10 mEq total) by mouth daily. Due for follow up visit w/PCP 08/03/19   Trixie Dredge, PA-C  Sod Picosulfate-Mag Ox-Cit [facility]) 10-3.5-12 MG-GM -GM/160ML SOLN Take 1 kit by mouth as directed. 08/11/18   Cirigliano, Vito V, DO  topiramate (TOPAMAX) 100 MG tablet Take 100 mg by mouth at bedtime. 01/16/19   [provider]  valsartan-hydrochlorothiazide (DIOVAN-HCT) 160-12.5 MG tablet Take 1 tablet by mouth daily. 05/12/19   Trixie Dredge, PA-C    Family History Family History  Problem Relation Age of Onset  . Diabetes Maternal Grandmother        Died of heart attack  . Hypertension Maternal Grandmother   . Colon cancer Maternal Grandmother   . Stroke Paternal Grandfather   . Esophageal cancer Neg Hx      Social History Social History   Tobacco Use  . Smoking status: Former Smoker    Types: Cigarettes    Quit date: 09/2016    Years since quitting: 3.0  . Smokeless tobacco: Never Used  . Tobacco comment: very rare, social  Substance Use Topics  . Alcohol use: Yes    Alcohol/week: 2.0 standard drinks    Types: 2 Cans of beer per week    Comment: social  . Drug use: No     Allergies   Penicillins, Fish allergy, Pineapple, Wheat bran, Other, Shrimp [shellfish allergy], and Yeast-related products   Review of Systems Review of Systems  Neurological: Positive for dizziness (resolved), speech difficulty (resolved) and headaches (chronic).     Physical Exam Updated Vital Signs BP 109/79 (BP Location: Left Arm)   Pulse 88   Temp 98.1 F (36.7 C) (Oral)   Resp 16   LMP 05/07/2018   SpO2 99%   Physical Exam Vitals signs and nursing  note reviewed.  Constitutional:      General: She is not in acute distress.    Appearance: She is well-developed.     Comments: Resting comfortably in bed in no acute distress  HENT:     Head: Normocephalic and atraumatic.  Eyes:     Conjunctiva/sclera: Conjunctivae normal.     Pupils: Pupils are equal, round, and reactive to light.  Neck:     Musculoskeletal: Normal range of motion and neck supple.     Vascular: No carotid bruit.  Cardiovascular:     Rate and Rhythm: Normal rate and regular rhythm.     Pulses: Normal pulses.  Pulmonary:     Effort: Pulmonary effort is normal. No respiratory distress.     Breath sounds: Normal breath sounds. No wheezing.  Abdominal:     General: Bowel sounds are normal. There is no distension.     Palpations: Abdomen is soft.     Tenderness: There is no abdominal tenderness.  Musculoskeletal: Normal range of motion.     Right lower leg: No edema.     Left lower leg: No edema.  Skin:    General: Skin is warm and dry.     Capillary Refill: Capillary refill takes less than 2 seconds.  Neurological:      Mental Status: She is alert and oriented to person, place, and time.     GCS: GCS eye subscore is 4. GCS verbal subscore is 5. GCS motor subscore is 6.     Cranial Nerves: Cranial nerves are intact.     Sensory: Sensation is intact.     Motor: Motor function is intact. No pronator drift.     Coordination: Coordination is intact. Coordination normal. Finger-Nose-Finger Test and Heel to [facility] Test normal.     Comments: No obvious neurologic deficits.  CN intact. Finger intact.  Fine movement and coronation intact. Pt with some confusion when I asked to do the same activity but with the other hand, this appears chronic, not acute      ED Treatments / Results  Labs (all labs ordered are listed, but only abnormal results are displayed) Labs Reviewed  COMPREHENSIVE METABOLIC PANEL - Abnormal; Notable for the following components:      Result Value   Creatinine, Ser 1.01 (*)    Total Bilirubin 0.1 (*)    All other components within normal limits  PROTIME-INR  APTT  CBC  DIFFERENTIAL  I-STAT BETA HCG BLOOD, ED ([facility], [facility], [facility] ONLY)    EKG EKG Interpretation  Date/Time:  Wednesday October 11 2019 10:13:17 EST Ventricular Rate:  77 PR Interval:  136 QRS Duration: 72 QT Interval:  370 QTC Calculation: 418 R Axis:   69 Text Interpretation: Normal sinus rhythm with sinus arrhythmia Normal ECG no prior ECG for comparison. No STEMI Confirmed by Antony Blackbird [phone]) on 10/11/2019 12:20:05 PM   Radiology Ct Head Wo Contrast  Result Date: 10/11/2019 CLINICAL DATA:  Left-sided headache with dizziness and transient episode of amnesia EXAM: CT HEAD WITHOUT CONTRAST TECHNIQUE: Contiguous axial images were obtained from the base of the skull through the vertex without intravenous contrast. COMPARISON:  February 25, 2017 FINDINGS: Brain: The ventricles are normal in size and configuration. There is a small cavum septum pellucidum, an anatomic variant. There is no demonstrable intracranial mass,  hemorrhage, extra-axial fluid collection, or midline shift. There has been interval resolution of prior infarcts in the superior left frontal, superior left parietal, and left parieto-occipital junction regions. No acute  appearing infarct is identified. The brain parenchyma to the right of midline appears unremarkable. Vascular: No hyperdense vessels are evident. No appreciable vascular calcification. Skull: The bony calvarium appears intact. Sinuses/Orbits: Visualized paranasal sinuses are clear. Visualized orbits appear symmetric bilaterally. Other: Mastoid air cells are clear. IMPRESSION: Prior left frontal, left occipital, and left parieto-occipital infarcts. No acute infarct evident. No mass or hemorrhage. Electronically Signed   By: Lowella Grip III M.D.   On: 10/11/2019 13:06   Mr Brain Wo Contrast (neuro Protocol)  Result Date: 10/11/2019 CLINICAL DATA:  Lightheaded, dizziness EXAM: MRI HEAD WITHOUT CONTRAST TECHNIQUE: Multiplanar, multiecho pulse sequences of the brain and surrounding structures were obtained without intravenous contrast. COMPARISON:  2018 FINDINGS: Brain: There is encephalomalacia and gliosis in the left MCA and PCA territories reflecting sequelae infarcts seen on 2018 MRI. Susceptibility associated with these areas is compatible with chronic blood products. There is no acute infarction or intracranial hemorrhage. 8 mm extra-axial T2 FLAIR hyperintense lesion along the lateral left temporal convexity is compatible with a stable meningioma. Vascular: Major vessel flow voids at the skull base are preserved. Skull and upper cervical spine: Normal marrow signal is preserved. Sinuses/Orbits: Paranasal sinuses are aerated. Orbits are unremarkable. Other: Mastoid air cells are clear. IMPRESSION: No evidence of recent infarction or hemorrhage. Sequelae of left MCA and PCA territory infarctions seen in 2018. Stable subcentimeter left temporal convexity meningioma. Electronically Signed    By: Macy Mis M.D.   On: 10/11/2019 15:08    Procedures Procedures (including critical care time)  Medications Ordered in ED Medications  sodium chloride flush (NS) 0.9 % injection 3 mL (has no administration in time range)  ketorolac (TORADOL) 15 MG/ML injection 15 mg (15 mg Intravenous Given 10/11/19 1347)  LORazepam (ATIVAN) injection 1 mg (1 mg Intravenous Given 10/11/19 1349)     Initial Impression / Assessment and Plan / ED Course  I have reviewed the triage vital signs and the nursing notes.  Pertinent labs & imaging results that were available during my care of the patient were reviewed by me and considered in my medical decision making (see chart for details).       Patient presenting for evaluation of speech difficulties yesterday, resolved without intervention.  Also reporting left-sided persistent headache.  Physical exam reassuring, no obvious acute neurologic deficits.  Consider developing normality causing recurrence of strokelike symptoms versus new stroke versus TIA versus complex migraine.  Labs obtained from triage are reassuring. CT head pending.   CT head negative for acute findings.  Will discuss with neurology for further management.  Discussed with Dr. Malen Gauze from neurology who recommends MRI.  If normal, patient can follow-up outpatient with her neurologist. If abnormal, will need admission for further work-up.  MRI negative for acute findings.  On reassessment, patient reports headache improved with Toradol.  Patient remained stable without further neurologic deficits.  Discussed importance of follow-up with neurology and close monitoring of symptoms.  If any return of strokelike symptoms, patient to return to the ED.  At this time, patient appears safe for discharge.  Return precautions given.  Patient states she understands and agrees to plan.   Final Clinical Impressions(s) / ED Diagnoses   Final diagnoses:  Chronic nonintractable headache, unspecified  headache type  Difficulty with speech    ED Discharge Orders    None       Franchot Heidelberg, PA-C 10/11/19 1528    Tegeler, Gwenyth Allegra, MD 10/11/19 [phone]

## 2019-10-12 ENCOUNTER — Telehealth: Payer: Self-pay | Admitting: Physician Assistant

## 2019-10-12 NOTE — Telephone Encounter (Signed)
Patient went to [facility], she had a dizzy spell, forgot where she was at. She ended up blanking out (per patient). This happened yesterday and she couldn't even talk. This morning she was advised by her headache doctor to go to the ER. Patient was told after discharge to follow up with PCP. Former Programmer, applications patient, nothing open with Dr.Alexander in an appropriate slot. Would like to be seen ASAP for this follow up. Please advise.

## 2019-10-13 NOTE — Telephone Encounter (Signed)
Okay to put an acute slot

## 2019-10-13 NOTE — Telephone Encounter (Signed)
Appointment has been made. No further questions at this time.  

## 2019-10-15 ENCOUNTER — Other Ambulatory Visit: Payer: Self-pay | Admitting: Physician Assistant

## 2019-10-15 DIAGNOSIS — E876 Hypokalemia: Secondary | ICD-10-CM

## 2019-10-17 ENCOUNTER — Encounter: Payer: Self-pay | Admitting: Osteopathic Medicine

## 2019-10-17 ENCOUNTER — Ambulatory Visit (INDEPENDENT_AMBULATORY_CARE_PROVIDER_SITE_OTHER): Payer: BC Managed Care – PPO | Admitting: Osteopathic Medicine

## 2019-10-17 VITALS — BP 116/83 | HR 101 | Temp 98.5°F | Wt 131.1 lb

## 2019-10-17 DIAGNOSIS — R202 Paresthesia of skin: Secondary | ICD-10-CM

## 2019-10-17 DIAGNOSIS — Z8669 Personal history of other diseases of the nervous system and sense organs: Secondary | ICD-10-CM

## 2019-10-17 DIAGNOSIS — Z8673 Personal history of transient ischemic attack (TIA), and cerebral infarction without residual deficits: Secondary | ICD-10-CM

## 2019-10-17 DIAGNOSIS — R479 Unspecified speech disturbances: Secondary | ICD-10-CM

## 2019-10-17 DIAGNOSIS — E785 Hyperlipidemia, unspecified: Secondary | ICD-10-CM

## 2019-10-17 DIAGNOSIS — R42 Dizziness and giddiness: Secondary | ICD-10-CM

## 2019-10-17 MED ORDER — ATORVASTATIN CALCIUM 40 MG PO TABS: 40.0000 mg | ORAL_TABLET | Freq: Every day | ORAL | 3 refills | Status: DC

## 2019-10-17 NOTE — Patient Instructions (Addendum)
Plan:  To do everything we can to help prevent stroke, we are increasing the atorvastatin from 10 mg to 40 mg.  I have placed a referral to Dr. Leonie Man, neurology.  If you do not hear back from their office within the next few days, call them or call us.  I am not sure if the episode you had was an atypical migraine, vertigo, or TIA/mini stroke.  Let's ask Dr. Leonie Man about this.  You need any paperwork for missing work, let me know.  Would ask your supervisor for anything that they might need.  We will get labs after 4 to 6 weeks to check cholesterol, B12 levels for the tingling in the fingers.  Orders are in, and can just come directly to the lab on the first floor of the med center building!

## 2019-10-17 NOTE — Progress Notes (Signed)
HPI: Ashley Beard is a 53 y.o. female who  has a past medical history of Abnormal Pap smear of cervix, Anxiety, Carotid arterial disease ([place]), Headache, Meningioma, cerebral ([place]) (08/18/2018), Refusal of blood transfusions as patient is Jehovah's Witness, Stroke ([place]), and Trigeminal neuralgia (06/22/2017).  she presents to [facility] today, 10/17/19,  for chief complaint of:  ER follow-up  Patient presents today for follow-up from hospital visit, seen in ER 6 days ago and d/c home same day.  Presented for evaluation of speech difficulty of acute onset, associated with dizziness, symptoms lasted for about 30 minutes and spontaneously resolved.  Hx of CVA 2 years prior, with residual chronic left-sided headache and no other neuro deficits.  CT head was negative.  Patient was discussed with neurology on-call who recommended MRI, which was negative.  Neurologist reported that if MRI was abnormal, patient would need admission, but if normal can follow-up outpatient with her neurologist.  Review of neurology notes, looks like patient had a telemed visit prior to presentation to ER.   In clinic today, patient is not clear on her reason for visit other than "dizziness."  She states that her headache specialist "change my headache medication" but she cannot tell us what exactly was adjusted/what she is taking now.  Aced on most recent notes, it does not look like any medication changes were made, patient was continued on amitriptyline 25 mg nightly, aspirin 325 mg daily, atorvastatin 10 mg daily, carbamazepine 200 mg unspecified frequency, fish oil 1000 mg daily, meclizine 25 mg as needed for dizziness, Ubrelvy 50 mg prn ehadache, paroxetine 20 mg daily, topiramate 100 mg taking 1.5 tablets daily, valsartan-hydrochlorothiazide 320-25 mg taking half tablet daily  Patient states she has a "headache doctor" and also a general neurologist, Dr. Leonie Man, who she saw a few years ago  after her stroke.  Has not contacted either of these practitioners regarding her recent ER visit.      At today's visit 10/17/19 ... PMH, PSH, FH reviewed and updated as needed.  Current medication list and allergy/intolerance hx reviewed and updated as needed. (See remainder of HPI, ROS, Phys Exam below)   No results found.  No results found for this or any previous visit (from the past 72 hour(s)).        ASSESSMENT/PLAN: The primary encounter diagnosis was History of ischemic stroke. Diagnoses of Vertigo, History of migraine, Difficulty with speech, Borderline hyperlipidemia, History of cerebrovascular accident (CVA) from left carotid artery occlusion involving left middle cerebral artery territory, and Paresthesia were also pertinent to this visit.  Orders Placed This Encounter  Procedures  . Lipid panel  . COMPLETE METABOLIC PANEL WITH GFR  . Vitamin B12  . Ambulatory referral to Neurology     Meds ordered this encounter  Medications  . atorvastatin (LIPITOR) 40 MG tablet    Sig: Take 1 tablet (40 mg total) by mouth daily.    Dispense:  90 tablet    Refill:  3    Patient Instructions  Plan:  To do everything we can to help prevent stroke, we are increasing the atorvastatin from 10 mg to 40 mg.  I have placed a referral to Dr. Leonie Man, neurology.  If you do not hear back from their office within the next few days, call them or call us.  I am not sure if the episode you had was an atypical migraine, vertigo, or TIA/mini stroke.  Let's ask Dr. Leonie Man about this.  You  need any paperwork for missing work, let me know.  Would ask your supervisor for anything that they might need.  We will get labs after 4 to 6 weeks to check cholesterol, B12 levels for the tingling in the fingers.  Orders are in, and can just come directly to the lab on the first floor of the med center building!          Follow-up plan: Return for pending lab results / neurology visit.  Otherwise Annual Physical sometime next year / spring .                                                 ################################################# ################################################# ################################################# #################################################   Patient is unclear on exact medication list  Per Neurology notes: amitriptyline 25 mg nightly, aspirin 325 mg daily, atorvastatin 10 mg daily, carbamazepine 200 mg unspecified frequency, fish oil 1000 mg daily, meclizine 25 mg as needed for dizziness, Ubrelvy 50 mg prn ehadache, paroxetine 20 mg daily, topiramate 100 mg taking 1.5 tablets daily, valsartan-hydrochlorothiazide 320-25 mg taking half tablet daily  Current Meds in our system  Medication Sig  . amitriptyline (ELAVIL) 25 MG tablet Take 2 tablets (50 mg total) by mouth at bedtime.  Marland Kitchen aspirin 325 MG tablet Take 1 tablet (325 mg total) by mouth daily.  Marland Kitchen atorvastatin (LIPITOR) 40 MG tablet Take 1 tablet (40 mg total) by mouth daily.  . baclofen (LIORESAL) 10 MG tablet Take 1 tablet (10 mg total) by mouth 3 (three) times daily as needed for muscle spasms (headache, neck pain).  . carbamazepine (TEGRETOL) 200 MG tablet Take 1 tablet (200 mg total) by mouth at bedtime.  . Multiple Vitamin (MULTIVITAMIN WITH MINERALS) TABS tablet Take 1 tablet by mouth daily.  . Omega-3 Fatty Acids (FISH OIL ADULT GUMMIES PO) Take by mouth.  Marland Kitchen PARoxetine (PAXIL) 20 MG tablet Take 1 tablet by mouth daily.  . potassium chloride (KLOR-CON) 10 MEQ tablet TAKE 1 TABLET BY MOUTH DAILY  . Sod Picosulfate-Mag Ox-Cit Acd (CLENPIQ) 10-3.5-12 MG-GM -GM/160ML SOLN Take 1 kit by mouth as directed.  . topiramate (TOPAMAX) 100 MG tablet Take 100 mg by mouth at bedtime.  . valsartan-hydrochlorothiazide (DIOVAN-HCT) 160-12.5 MG tablet Take 1 tablet by mouth daily.  . [DISCONTINUED] atorvastatin (LIPITOR) 10 MG tablet Take 1  tablet (10 mg total) by mouth at bedtime. Due for labs    Allergies  Allergen Reactions  . Penicillins Hives and Other (See Comments)    Nightmares Has patient had a PCN reaction causing immediate rash, facial/tongue/throat swelling, SOB or lightheadedness with hypotension: Yes Has patient had a PCN reaction causing severe rash involving mucus membranes or skin necrosis: No Has patient had a PCN reaction that required hospitalization pt was in the hospital at time of reaction Has patient had a PCN reaction occurring within the last 10 years: No If all of the above answers are "NO", then may proceed with Cephalosporin use.  . Fish Allergy Itching and Swelling    Lymph node swelling  . Pineapple Swelling  . Wheat Bran Swelling  . Other Itching and Swelling    Reaction to blue cheese Lymph node swelling  . Shrimp [Shellfish Allergy] Itching and Swelling    Lymph node swelling  . Yeast-Related Products Itching and Swelling    Lymph node swelling       Review  of Systems:  Constitutional: No fever/chills  HEENT: +chronic L sided headache, no vision change  Cardiac: No  chest pain, No  pressure, No palpitations  Respiratory:  No  shortness of breath. No  Cough  Gastrointestinal: No  abdominal pain  Musculoskeletal: No new myalgia/arthralgia  Skin: No  Rash  Hem/Onc: No  easy bruising/bleeding, No  abnormal lumps/bumps  Neurologic: No  weakness, +intermittent Dizziness  Psychiatric: No  concerns with depression, No  concerns with anxiety  Exam:  BP 116/83 (BP Location: Left Arm, Patient Position: Sitting, Cuff Size: Normal)   Pulse (!) 101   Temp 98.5 F (36.9 C) (Oral)   Wt 131 lb 1.3 oz (59.5 kg)   LMP 05/07/2018   SpO2 100%   BMI 26.47 kg/m   Constitutional: VS see above. General Appearance: alert, well-developed, well-nourished, NAD  Eyes: Normal lids and conjunctive, non-icteric sclera  Ears, Nose, Mouth, Throat: MMM, Normal external inspection  ears/nares/mouth/lips/gums.  Neck: No masses, trachea midline.   Respiratory: Normal respiratory effort. no wheeze, no rhonchi, no rales  Cardiovascular: S1/S2 normal, no murmur, no rub/gallop auscultated. RRR.   Musculoskeletal: Gait normal. Symmetric and independent movement of all extremities  Neurological: Normal balance/coordination. No tremor. EOMI, PERRL, no nystagmus, movement of extremities intact and symmetrical  Skin: warm, dry, intact.   Psychiatric: Normal judgment/insight. Normal mood and affect. Oriented x3.       Visit summary with medication list and pertinent instructions was printed for patient to review, patient was advised to alert [place] if any updates are needed. All questions at time of visit were answered - patient instructed to contact office with any additional concerns. ER/RTC precautions were reviewed with the patient and understanding verbalized.   Note: Total time spent 25 minutes, greater than 50% of the visit was spent face-to-face counseling and coordinating care for the following: The primary encounter diagnosis was History of ischemic stroke. Diagnoses of Vertigo, History of migraine, Difficulty with speech, Borderline hyperlipidemia, History of cerebrovascular accident (CVA) from left carotid artery occlusion involving left middle cerebral artery territory, and Paresthesia were also pertinent to this visit.Marland Kitchen  Please note: voice recognition software was used to produce this document, and typos may escape review. Please contact Dr. Sheppard Coil for any needed clarifications.    Follow up plan: Return for pending lab results / neurology visit. Otherwise Annual Physical sometime next year / spring . Patient should bring pill bottles with her to that visit

## 2019-10-18 DIAGNOSIS — F419 Anxiety disorder, unspecified: Secondary | ICD-10-CM | POA: Diagnosis not present

## 2019-10-18 DIAGNOSIS — G43709 Chronic migraine without aura, not intractable, without status migrainosus: Secondary | ICD-10-CM | POA: Diagnosis not present

## 2019-10-18 DIAGNOSIS — F329 Major depressive disorder, single episode, unspecified: Secondary | ICD-10-CM | POA: Diagnosis not present

## 2019-10-18 DIAGNOSIS — Z8673 Personal history of transient ischemic attack (TIA), and cerebral infarction without residual deficits: Secondary | ICD-10-CM | POA: Diagnosis not present

## 2019-11-29 ENCOUNTER — Telehealth: Payer: Self-pay | Admitting: Osteopathic Medicine

## 2019-11-29 NOTE — Telephone Encounter (Signed)
Patient called and reports that she was in need of an appointment for body aches. She was offered a virtual appointment and declined. She is going to check out with Urgent Care or the Emergency Room to get evaluated. No other questions at this time.

## 2019-11-30 DIAGNOSIS — Z20828 Contact with and (suspected) exposure to other viral communicable diseases: Secondary | ICD-10-CM | POA: Diagnosis not present

## 2019-11-30 DIAGNOSIS — Z1159 Encounter for screening for other viral diseases: Secondary | ICD-10-CM | POA: Diagnosis not present

## 2019-11-30 LAB — NOVEL CORONAVIRUS, NAA: SARS-CoV-2, NAA: POSITIVE

## 2019-12-18 ENCOUNTER — Telehealth: Payer: Self-pay | Admitting: Neurology

## 2019-12-18 ENCOUNTER — Ambulatory Visit: Payer: BC Managed Care – PPO | Admitting: Neurology

## 2019-12-18 ENCOUNTER — Encounter: Payer: Self-pay | Admitting: Neurology

## 2019-12-18 ENCOUNTER — Other Ambulatory Visit: Payer: Self-pay

## 2019-12-18 VITALS — BP 87/54 | Temp 97.5°F | Ht 59.0 in | Wt 122.0 lb

## 2019-12-18 DIAGNOSIS — R42 Dizziness and giddiness: Secondary | ICD-10-CM | POA: Diagnosis not present

## 2019-12-18 DIAGNOSIS — G3184 Mild cognitive impairment, so stated: Secondary | ICD-10-CM | POA: Diagnosis not present

## 2019-12-18 DIAGNOSIS — R413 Other amnesia: Secondary | ICD-10-CM

## 2019-12-18 MED ORDER — TOPIRAMATE 100 MG PO TABS: 50.0000 mg | ORAL_TABLET | Freq: Every day | ORAL | 1 refills | Status: DC

## 2019-12-18 MED ORDER — DIVALPROEX SODIUM ER 500 MG PO TB24: 500.0000 mg | ORAL_TABLET | Freq: Every day | ORAL | 3 refills | Status: AC

## 2019-12-18 NOTE — Patient Instructions (Addendum)
I had a long discussion with the patient and with her daughter regarding her recent cognitive complaints of memory difficulties as well as trouble speaking and dizziness being of unclear etiology needing further evaluation.  Recommend checking CT angiogram of the brain and neck, EEG, lab work for reversible causes of memory loss.And urinalysis.  I recommend she start Depakote ER 500 mg daily to help with her headaches as well as depression.  She also can advised to continue her Paxil for depression and follow-up with her primary care physician for treatment for the same.  Continue aspirin for stroke prevention and maintain aggressive risk factor modification with strict control of hypertension with blood pressure goal below 130/90, lipids with LDL cholesterol goal below 70 mg percent.  I also recommend she reduce the dose of Topamax to 50 mg at night as it may be contributing to her memory loss.  She will return for follow-up in the future in 3 months or call earlier if necessary.

## 2019-12-18 NOTE — Telephone Encounter (Signed)
I spoke with daughter on Dr Leonie Man recommendations.We are doing a work up for pt. The daughter was told by me that he will speak with her regarding the her mom.. Dr Leonie Man did speak with the daughter with pt in exam room. Dr.SEthi stated pt has severe depression and mild cognitive issues. See notes.

## 2019-12-18 NOTE — Progress Notes (Signed)
[facility] [address] [place]. [place] [address] [phone]       OFFICE CONSULT NOTE  Ashley Beard Date of Birth:  05-29-1966 Medical Record Number:  [id number]   Referring MD: Emeterio Reeve, DO  Reason for Referral: Dizziness difficulties speech and stroke HPI: Ashley Beard is a 54 year old African-American lady seen today for office consultation symptoms of speech difficulty, disorientation, dizziness and memory loss.  History is obtained from the patient, daughter as well as review of electronic medical records and I personally reviewed imaging films in PACS.  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 Donnetta Hutching in 2018.  Patient was seen by me at that time in the office and then lost to follow-up.  She states she had an episode of sudden onset of dizziness speech difficulties and headache for which she was seen in the ER had [facility] on 10/10/2017.  CT scan and MRI scan at that time showed no acute abnormalities and showed remote infarcts on the left side.  Patient states since then she has been having episodes of confusion with 2 episodes of nearly passing out at work.  She at times could not understand what her coworkers saying and appeared disoriented and kept on asking them to repeat themselves and she had trouble understanding them.  She feels memory difficulties since then and getting worse.  She is easily forgetful.  She also at times feels she has a dj vu-like feeling which things often repeating themselves multiple times and she is aware that this is not real.  She denies any strange smell, facial or extremity twitching's, loss of consciousness or tonic-clonic activity.  She does not have any prior history of seizures but has not had any  recent EEG.  She had lab work during her November 2020 ER visit where comprehensive metabolic panel labs and CBC were unremarkable.  She denies significant headaches focal extremity weakness numbness gait or balance problems at the present time.  Patient was recently started on Paxil by her primary care physician for depression but that has not helped.  The daughter feels that the depression started after recent diagnosis of Covid and feel the patient may have brain fog related to that however she was having some memory issues and confusion even before she got Covid.  ROS:   14 system review of systems is positive for dizziness, speech difficulty, confusion, disorientation, memory loss and all other systems negative  PMH:  Past Medical History:  Diagnosis Date   Abnormal Pap smear of cervix    age 44   Anxiety    Carotid arterial disease ([place])    Headache    Meningioma, cerebral ([place]) 08/18/2018   5 x 12 mm meningioma left middle cranial fossa similar in size to the prior MRI of 02/25/2017   Refusal of blood transfusions as patient is Jehovah's Witness    Stroke [facility])    Trigeminal neuralgia 06/22/2017   Dx by neuro. Takes Tegretol prn    Social History:  Social History   Socioeconomic History   Marital status: Divorced    Spouse name: Not on file   Number of children: 3   Years of education: Not on file   Highest education level: Not on file  Occupational History  Not on file  Tobacco Use   Smoking status: Former Smoker    Types: Cigarettes    Quit date: 09/2016    Years since quitting: 3.2   Smokeless tobacco: Never Used   Tobacco comment: very rare, social  Substance and Sexual Activity   Alcohol use: Yes    Alcohol/week: 2.0 standard drinks    Types: 2 Cans of beer per week    Comment: social   Drug use: No   Sexual activity: Yes    Birth control/protection: Condom  Other Topics Concern   Not on file  Social History Narrative   Not on file    Social Determinants of Health   Financial Resource Strain:    Difficulty of Paying Living Expenses: Not on file  Food Insecurity:    Worried About Charity fundraiser in the Last Year: Not on file   [employer] of Food in the Last Year: Not on file  Transportation Needs:    Lack of Transportation (Medical): Not on file   Lack of Transportation (Non-Medical): Not on file  Physical Activity:    Days of Exercise per Week: Not on file   Minutes of Exercise per Session: Not on file  Stress:    Feeling of Stress : Not on file  Social Connections:    Frequency of Communication with Friends and Family: Not on file   Frequency of Social Gatherings with Friends and Family: Not on file   Attends Religious Services: Not on file   Active Member of Clubs or Organizations: Not on file   Attends Archivist Meetings: Not on file   Marital Status: Not on file  Intimate Partner Violence:    Fear of Current or Ex-Partner: Not on file   Emotionally Abused: Not on file   Physically Abused: Not on file   Sexually Abused: Not on file    Medications:   Current Outpatient Medications on File Prior to Visit  Medication Sig Dispense Refill   amitriptyline (ELAVIL) 25 MG tablet Take 2 tablets (50 mg total) by mouth at bedtime. 180 tablet 1   aspirin 325 MG tablet Take 1 tablet (325 mg total) by mouth daily.     atorvastatin (LIPITOR) 40 MG tablet Take 1 tablet (40 mg total) by mouth daily. 90 tablet 3   baclofen (LIORESAL) 10 MG tablet Take 1 tablet (10 mg total) by mouth 3 (three) times daily as needed for muscle spasms (headache, neck pain). 30 each 3   PARoxetine (PAXIL) 20 MG tablet Take 1 tablet by mouth daily.     potassium chloride (KLOR-CON) 10 MEQ tablet TAKE 1 TABLET BY MOUTH DAILY 30 tablet 0   topiramate (TOPAMAX) 100 MG tablet Take 100 mg by mouth at bedtime.     topiramate (TOPAMAX) 100 MG tablet Take 1 pill QHS x 1wk then may increase to 1.5 pills      UBRELVY 50 MG TABS      valsartan-hydrochlorothiazide (DIOVAN-HCT) 160-12.5 MG tablet Take 1 tablet by mouth daily. 90 tablet 0   No current facility-administered medications on file prior to visit.    Allergies:   Allergies  Allergen Reactions   Penicillins Hives and Other (See Comments)    Nightmares Has patient had a PCN reaction causing immediate rash, facial/tongue/throat swelling, SOB or lightheadedness with hypotension: Yes Has patient had a PCN reaction causing severe rash involving mucus membranes or skin necrosis: No Has patient had a PCN reaction that required hospitalization pt was  in the hospital at time of reaction Has patient had a PCN reaction occurring within the last 10 years: No If all of the above answers are "NO", then may proceed with Cephalosporin use.   Fish Allergy Itching and Swelling    Lymph node swelling   Pineapple Swelling   Wheat Bran Swelling   Other Itching and Swelling    Reaction to blue cheese Lymph node swelling   Shrimp [Shellfish Allergy] Itching and Swelling    Lymph node swelling   Yeast-Related Products Itching and Swelling    Lymph node swelling    Physical Exam General: Frail middle-aged African-American lady, seated, in no evident distress Head: head normocephalic and atraumatic.   Neck: supple with no carotid or supraclavicular bruits Cardiovascular: regular rate and rhythm, no murmurs Musculoskeletal: no deformity Skin:  no rash/petichiae Vascular:  Normal pulses all extremities  Neurologic Exam Mental Status: Awake and fully alert. Oriented to place and time. Recent and remote memory poor.  Recall 0/3.Marland Kitchen Attention span, concentration and fund of knowledge diminished.  Able to name only 9 animals which can walk on 4 legs.  Clock drawing 4/4.Marland Kitchen Mood and affect seem depressed.Mini-Mental status exam she scored 21/30 .  Geriatric depression scale she scored 9 suggestive of moderate to severe depression. cranial Nerves:  Fundoscopic exam reveals sharp disc margins. Pupils equal, briskly reactive to light. Extraocular movements full without nystagmus. Visual fields full to confrontation. Hearing intact. Facial sensation intact. Face, tongue, palate moves normally and symmetrically.  Motor: Normal bulk and tone. Normal strength in all tested extremity muscles. Sensory.: intact to touch , pinprick , position and vibratory sensation.  Coordination: Rapid alternating movements normal in all extremities. Finger-to-nose and heel-to-shin performed accurately bilaterally. Gait and Station: Arises from chair without difficulty. Stance is normal. Gait demonstrates normal stride length and balance . Able to heel, toe and tandem walk with moderate difficulty.  Reflexes: 1+ and symmetric. Toes downgoing.       ASSESSMENT: 54 year old lady with remote history of left MCA stroke and PCA infarcts in the setting of fetal left PCA which were felt to be thromboembolic secondary to left ICA clot for which he underwent left carotid endarterectomy 2018 and had done well.  Recent complaints of progressive dizziness, speech difficulties, memory and disorientation of unclear etiology.-Unclear whether her depression is reactive to her illness or the primary cause but clearly appears to be suboptimally treated     PLAN: I had a long discussion with the patient and with her daughter regarding her recent cognitive complaints of memory difficulties as well as trouble speaking and dizziness being of unclear etiology needing further evaluation.  Recommend checking CT angiogram of the brain and neck, EEG, lab work for reversible causes of memory loss.And urinalysis.  I recommend she start Depakote ER 500 mg daily to help with her headaches as well as depression.  She also can advised to continue her Paxil for depression and follow-up with her primary care physician for treatment for the same.  Continue aspirin for stroke prevention and maintain  aggressive risk factor modification with strict control of hypertension with blood pressure goal below 130/90, lipids with LDL cholesterol goal below 70 mg percent.  I also recommend she reduce the dose of Topamax to 50 mg at night as it may be contributing to her memory loss.  Greater than 50% time during this 50-minute consultation visit was spent on counseling and coordination of care discussion with patient and daughter and answering questions about her  cognitive impairment and depression she will return for follow-up in the future in 3 months or call earlier if necessary. Antony Contras, MD  [facility] [address] [place], [place] [address]  Phone [phone] Fax [phone]  Note: This document was prepared with digital dictation and possible smart phrase technology. Any transcriptional errors that result from this process are unintentional.

## 2019-12-18 NOTE — Telephone Encounter (Signed)
Patient's daughter came with patient to office visit today. She was not allowed back in the room and felt her mother has severe memory issues.  She would like a call back and update on the visit. Best call back is [phone].

## 2019-12-19 ENCOUNTER — Telehealth: Payer: Self-pay

## 2019-12-19 ENCOUNTER — Encounter: Payer: Self-pay | Admitting: Medical-Surgical

## 2019-12-19 ENCOUNTER — Other Ambulatory Visit: Payer: Self-pay

## 2019-12-19 ENCOUNTER — Emergency Department (HOSPITAL_COMMUNITY)
Admission: EM | Admit: 2019-12-19 | Discharge: 2019-12-20 | Disposition: A | Discharge status: Home / Self Care | Payer: BC Managed Care – PPO | Attending: Emergency Medicine | Admitting: Emergency Medicine

## 2019-12-19 ENCOUNTER — Telehealth: Payer: Self-pay | Admitting: Neurology

## 2019-12-19 ENCOUNTER — Encounter (HOSPITAL_COMMUNITY): Payer: Self-pay | Admitting: Emergency Medicine

## 2019-12-19 ENCOUNTER — Ambulatory Visit (INDEPENDENT_AMBULATORY_CARE_PROVIDER_SITE_OTHER): Payer: BC Managed Care – PPO | Admitting: Medical-Surgical

## 2019-12-19 VITALS — BP 87/54 | Temp 97.5°F | Ht 59.0 in | Wt 122.0 lb

## 2019-12-19 DIAGNOSIS — Z0289 Encounter for other administrative examinations: Secondary | ICD-10-CM | POA: Diagnosis not present

## 2019-12-19 DIAGNOSIS — I9589 Other hypotension: Secondary | ICD-10-CM

## 2019-12-19 DIAGNOSIS — Z87891 Personal history of nicotine dependence: Secondary | ICD-10-CM | POA: Diagnosis not present

## 2019-12-19 DIAGNOSIS — E861 Hypovolemia: Secondary | ICD-10-CM

## 2019-12-19 DIAGNOSIS — R42 Dizziness and giddiness: Secondary | ICD-10-CM | POA: Diagnosis not present

## 2019-12-19 DIAGNOSIS — I63232 Cerebral infarction due to unspecified occlusion or stenosis of left carotid arteries: Secondary | ICD-10-CM | POA: Diagnosis not present

## 2019-12-19 DIAGNOSIS — Z79899 Other long term (current) drug therapy: Secondary | ICD-10-CM | POA: Insufficient documentation

## 2019-12-19 DIAGNOSIS — I959 Hypotension, unspecified: Secondary | ICD-10-CM | POA: Diagnosis not present

## 2019-12-19 DIAGNOSIS — E860 Dehydration: Secondary | ICD-10-CM

## 2019-12-19 DIAGNOSIS — F329 Major depressive disorder, single episode, unspecified: Secondary | ICD-10-CM

## 2019-12-19 DIAGNOSIS — Z7982 Long term (current) use of aspirin: Secondary | ICD-10-CM | POA: Insufficient documentation

## 2019-12-19 DIAGNOSIS — F32A Depression, unspecified: Secondary | ICD-10-CM

## 2019-12-19 LAB — BASIC METABOLIC PANEL
Anion gap: 9 (ref 5–15)
BUN: 13 mg/dL (ref 6–20)
CO2: 24 mmol/L (ref 22–32)
Calcium: 8.9 mg/dL (ref 8.9–10.3)
Chloride: 99 mmol/L (ref 98–111)
Creatinine, Ser: 0.89 mg/dL (ref 0.44–1.00)
GFR calc Af Amer: >60 mL/min (ref >60)
GFR calc non Af Amer: >60 mL/min (ref >60)
Glucose, Bld: 95 mg/dL (ref 70–99)
Potassium: 3.2 mmol/L — ABNORMAL LOW (ref 3.5–5.1)
Sodium: 132 mmol/L — ABNORMAL LOW (ref 135–145)

## 2019-12-19 LAB — CBC WITH DIFFERENTIAL/PLATELET
Abs Immature Granulocytes: 0.03 10*3/uL (ref 0.00–0.07)
Basophils Absolute: 0.1 10*3/uL (ref 0.0–0.1)
Basophils Relative: 1 %
Eosinophils Absolute: 0.1 10*3/uL (ref 0.0–0.5)
Eosinophils Relative: 1 %
HCT: 36.9 % (ref 36.0–46.0)
Hemoglobin: 12.4 g/dL (ref 12.0–15.0)
Immature Granulocytes: 0 %
Lymphocytes Relative: 20 %
Lymphs Abs: 1.7 10*3/uL (ref 0.7–4.0)
MCH: 31.6 pg (ref 26.0–34.0)
MCHC: 33.6 g/dL (ref 30.0–36.0)
MCV: 94.1 fL (ref 80.0–100.0)
Monocytes Absolute: 0.7 10*3/uL (ref 0.1–1.0)
Monocytes Relative: 8 %
Neutro Abs: 6.2 10*3/uL (ref 1.7–7.7)
Neutrophils Relative %: 70 %
Platelets: 256 10*3/uL (ref 150–400)
RBC: 3.92 MIL/uL (ref 3.87–5.11)
RDW: 12.4 % (ref 11.5–15.5)
WBC: 8.7 10*3/uL (ref 4.0–10.5)
nRBC: 0 % (ref 0.0–0.2)

## 2019-12-19 NOTE — Assessment & Plan Note (Signed)
FMLA and short term disability forms to be faxed and filled out. Last day worked 11/30/2019. Duration of time out desired is full 12 weeks.

## 2019-12-19 NOTE — Assessment & Plan Note (Signed)
Hold Diovan doses for now.  Check blood pressure daily and keep in a journal.  If blood pressure greater than 130/80 okay to take Diovan dose.

## 2019-12-19 NOTE — Telephone Encounter (Signed)
FYI: Pt called stating she was seen at [facility] in [facility] on 11/29/2019 and tested positive for COVID-19. Pt states she completed quarantine but is still not feeling well. Pt states she saw her neurologist in office yesterday and asked him to complete the paperwork and they told her that her PCP would have to complete it. Unable to pull records from [facility] through [facility] so a Health Maintenance form was completed and faxed to them at [phone]. Informed pt that in order to complete the FMLA paperwork that we would need a copy of the results and she would need a Dox visit. Pt agreeable for a Dox visit this afternoon at 4:00 PM with Samuel Bouche, DNP, APRN, FNP-BC. At pt's request, I called and spoke with her daughter [name] (on HIPPA/ROI form) and informed her of the appt and that she will need to be with her mom for the visit since it is a Dox visit and she does not understand the process. [name] was agreeable to the appt and plan.

## 2019-12-19 NOTE — Progress Notes (Signed)
Virtual Visit via Video Note  I connected with Ashley Beard on 12/19/19 at  4:00 PM EST by a video enabled telemedicine application and verified that I am speaking with the correct person using two identifiers.   I discussed the limitations of evaluation and management by telemedicine and the availability of in person appointments. The patient expressed understanding and agreed to proceed.  Subjective:    CC: FMLA and short-term disability paperwork  HPI:  54 year old female presenting with request for FMLA and short-term disability paperwork.  Had Covid 19 in December and completed her quarantine but is still having difficulty with increased memory issues.  Neurology yesterday with some medication changes.  Does report having some memory issues prior to having Covid but these have increased drastically since then.  Would like to have FMLA paperwork filled out for the full 12 weeks.  If she does begin to feel better during this time and feels ready to go back to work, she will let us know.  At her neurology appointment yesterday, blood pressure reported at 87/54 which was verified by recheck.  Takes valsartan-HCTZ 160-12.5 mg daily but ran out yesterday.  Reports having a blood pressure cuff at home but does not check blood pressure daily.  Endorses memory difficulties, poor sleep, increased anxiety, difficulty with speech, and some dizziness.  Denies fever, chills, upper respiratory symptoms.  Notes 9 pound weight loss in 2 months.  Reports she has not been taking her Paxil as prescribed .  Some medication changes by neurology noted:  Topamax reduced to 50 mg nightly Start Depakote 500 mg nightly Restart Paxil 20 mg nightly  Past medical history, Surgical history, Family history not pertinant except as noted below, Social history, Allergies, and medications have been entered into the medical record, reviewed, and corrections made.   Review of Systems: No fevers, chills, night sweats, weight  loss, chest pain, or shortness of breath.   Objective:    General: Speech halting, unable to complete full sentences without pauses.  Some stuttering noted.  Voice very quiet.  No shortness of breath.  Alert and oriented x3, slowed responses. No apparent acute distress.   Impression and Recommendations:    Hypotension Hold Diovan doses for now.  Check blood pressure daily and keep in a journal.  If blood pressure greater than 130/80 okay to take Diovan dose.   Depression Restart Paxil 20 mg daily.   Encounter for completion of form with patient FMLA and short term disability forms to be faxed and filled out. Last day worked 11/30/2019. Duration of time out desired is full 12 weeks.   Return in about 2 weeks (around 01/02/2020) for blood pressure follow up.   I discussed the assessment and treatment plan with the patient. The patient was provided an opportunity to ask questions and all were answered. The patient agreed with the plan and demonstrated an understanding of the instructions.   The patient was advised to call back or seek an in-person evaluation if the symptoms worsen or if the condition fails to improve as anticipated.   Clearnce Sorrel, DNP, APRN, FNP-BC [facility]

## 2019-12-19 NOTE — Assessment & Plan Note (Signed)
Restart Paxil 20 mg daily.

## 2019-12-19 NOTE — Telephone Encounter (Signed)
I spoke with [place] pts daughter. Contact number was change to pts daughter for calls, appts and labs. EEg was schedule, three month follow up done with Dr. Leonie Man. PT left after 500pm. I advise daughter pt can get labs after EEG test. She verbalized understanding.

## 2019-12-19 NOTE — Telephone Encounter (Signed)
I called patient and LVM regarding scheduling an EEG per Dr. Leonie Man.

## 2019-12-19 NOTE — ED Triage Notes (Signed)
Patient reports generalized weakness/fatigue and lightheaded this week , tested positive for Covid19  , denies fever or chills , hypotensive at arrival BP= 86/64 ; 92/79.

## 2019-12-20 LAB — LACTIC ACID, PLASMA
Lactic Acid, Venous: 0.8 mmol/L (ref 0.5–1.9)
Lactic Acid, Venous: 0.7 mmol/L (ref 0.5–1.9)

## 2019-12-20 MED ORDER — POTASSIUM CHLORIDE 10 MEQ/100ML IV SOLN
10.0000 meq | Freq: Once | INTRAVENOUS | Status: AC
  Administered 2019-12-20: 10 meq via INTRAVENOUS
  Filled 2019-12-20: qty 100

## 2019-12-20 MED ORDER — SODIUM CHLORIDE 0.9 % IV BOLUS
1000.0000 mL | Freq: Once | INTRAVENOUS | Status: AC
  Administered 2019-12-20: 10:00:00 1000 mL via INTRAVENOUS

## 2019-12-20 MED ORDER — SODIUM CHLORIDE 0.9 % IV BOLUS
1000.0000 mL | Freq: Once | INTRAVENOUS | Status: AC
  Administered 2019-12-20: 07:00:00 1000 mL via INTRAVENOUS

## 2019-12-20 NOTE — ED Provider Notes (Signed)
[facility] EMERGENCY DEPARTMENT Provider Note   CSN: [id number] Arrival date & time: 12/19/19  2034     History Chief Complaint  Patient presents with  . Gen.Weakness/ Fatigue/Hypotensive    Covid+    Ashley Beard is a 54 y.o. female.  HPI     This a 54 year old female with a history of anxiety, depression, stroke, mild cognitive impairment following stroke, recent diagnosis of COVID-19 who presents with generalized fatigue, lightheadedness, low blood pressure.  Patient reports that she has had low blood pressure the last 1 to 2 days.  It was first noted in her neurology office.  She has had some lightheaded and dizziness recently.  She reports it is worse when she stands.  She was diagnosed with COVID-19 at an outside facility on December 23.  She states that since that time she has had nausea.  She also reports that she has had poor p.o. intake because she has not felt like eating.  She is not had any vomiting, abdominal pain, diarrhea.  She denies chest pain or shortness of breath.  She is not had any ongoing fevers.  Patient is concerned that she has had worsening mental status since her Covid diagnosis.  She denies any new focal deficits or speech difficulty.  Regarding her blood pressure, she states she normally takes blood pressure medication but ran out and was told to hold her medication by her primary physician.  She also reports difficulty sleeping.  I have reviewed her chart.  She saw Dr. Leonie Man on Monday.  He noted flat affect and likely depression as well as mild cognitive impairment.  He ordered multiple tests including CT angio of the head and neck and EEG.  She followed up with her primary physician regarding her daughter getting FMLA paperwork.  At that visit, the hypotension was discussed and she was told to hold her blood pressure medications.  Past Medical History:  Diagnosis Date  . Abnormal Pap smear of cervix    age 75  . Anxiety   . Carotid  arterial disease ([place])   . Headache   . Meningioma, cerebral ([place]) 08/18/2018   5 x 12 mm meningioma left middle cranial fossa similar in size to the prior MRI of 02/25/2017  . Refusal of blood transfusions as patient is Jehovah's Witness   . Stroke ([place])   . Trigeminal neuralgia 06/22/2017   Dx by neuro. Takes Tegretol prn    Patient Active Problem List   Diagnosis Date Noted  . Hypotension 12/19/2019  . Encounter for completion of form with patient 12/19/2019  . Depression 06/21/2019  . Caregiver stress syndrome 02/13/2019  . Chronic migraine 09/15/2018  . Hypokalemia due to loss of potassium 08/29/2018  . Meningioma, cerebral ([place]) 08/18/2018  . Colon cancer screening 06/25/2018  . Encounter for medication management 06/25/2018  . Other headache syndrome 06/13/2018  . Abnormal mammogram of left breast 10/06/2017  . Trigeminal neuralgia 06/22/2017  . Right homonymous hemianopsia 03/16/2017  . History of cerebrovascular accident (CVA) from left carotid artery occlusion involving left middle cerebral artery territory 02/26/2017  . Hypertension goal BP (blood pressure) < 130/80 02/22/2017  . Carotid artery disease ([place]) 02/22/2017    Past Surgical History:  Procedure Laterality Date  . ENDARTERECTOMY Left 02/24/2017   Procedure: ENDARTERECTOMY CAROTID with removal of thrombus ;  Surgeon: Rosetta Posner, MD;  Location: [facility];  Service: Vascular;  Laterality: Left;  . GYNECOLOGIC CRYOSURGERY     age 39  .  IR GENERIC HISTORICAL  02/25/2017   IR ANGIO VERTEBRAL SEL SUBCLAVIAN INNOMINATE UNI R MOD SED 02/25/2017 [name], MD [facility]  . IR GENERIC HISTORICAL  02/25/2017   IR ANGIO INTRA EXTRACRAN SEL COM CAROTID INNOMINATE BILAT MOD SED 02/25/2017 [name], MD [facility]  . OOPHORECTOMY    . TEE WITHOUT CARDIOVERSION N/A 05/04/2017   Procedure: TRANSESOPHAGEAL ECHOCARDIOGRAM (TEE);  Surgeon: Lelon Perla, MD;  Location: [facility] ENDOSCOPY;  Service: Cardiovascular;   Laterality: N/A;     OB History   No obstetric history on file.    Obstetric Comments  N/A        Family History  Problem Relation Age of Onset  . Diabetes Maternal Grandmother        Died of heart attack  . Hypertension Maternal Grandmother   . Colon cancer Maternal Grandmother   . Stroke Paternal Grandfather   . Esophageal cancer Neg Hx     Social History   Tobacco Use  . Smoking status: Former Smoker    Types: Cigarettes    Quit date: 09/2016    Years since quitting: 3.2  . Smokeless tobacco: Never Used  . Tobacco comment: very rare, social  Substance Use Topics  . Alcohol use: Yes    Alcohol/week: 2.0 standard drinks    Types: 2 Cans of beer per week    Comment: social  . Drug use: No    Home Medications Prior to Admission medications   Medication Sig Start Date End Date Taking? Authorizing Provider  amitriptyline (ELAVIL) 25 MG tablet Take 2 tablets (50 mg total) by mouth at bedtime. 02/13/19   Trixie Dredge, PA-C  aspirin 325 MG tablet Take 1 tablet (325 mg total) by mouth daily. 09/05/18   Trixie Dredge, PA-C  atorvastatin (LIPITOR) 40 MG tablet Take 1 tablet (40 mg total) by mouth daily. 10/17/19   Emeterio Reeve, DO  baclofen (LIORESAL) 10 MG tablet Take 1 tablet (10 mg total) by mouth 3 (three) times daily as needed for muscle spasms (headache, neck pain). 08/03/19 08/02/20  Trixie Dredge, PA-C  divalproex (DEPAKOTE ER) 500 MG 24 hr tablet Take 1 tablet (500 mg total) by mouth daily. 12/18/19   Garvin Fila, MD  PARoxetine (PAXIL) 20 MG tablet Take 1 tablet by mouth daily. 04/05/19   [provider]  potassium chloride (KLOR-CON) 10 MEQ tablet TAKE 1 TABLET BY MOUTH DAILY 10/16/19   Emeterio Reeve, DO  topiramate (TOPAMAX) 100 MG tablet Take 1 pill QHS x 1wk then may increase to 1.5 pills 07/05/19   [provider]  topiramate (TOPAMAX) 100 MG tablet Take 0.5 tablets (50 mg total) by mouth at  bedtime. 12/18/19   Garvin Fila, MD  UBRELVY 50 MG TABS  09/05/19   [provider]  valsartan-hydrochlorothiazide (DIOVAN-HCT) 160-12.5 MG tablet Take 1 tablet by mouth daily. 05/12/19   Trixie Dredge, PA-C    Allergies    Penicillins, Fish allergy, Pineapple, Wheat bran, Other, Shrimp [shellfish allergy], and Yeast-related products  Review of Systems   Review of Systems  Constitutional: Negative for fever.  Respiratory: Negative for shortness of breath.   Cardiovascular: Negative for chest pain.  Gastrointestinal: Positive for nausea. Negative for abdominal pain, diarrhea and vomiting.  Genitourinary: Negative for dysuria.  Skin: Negative for rash.  Neurological: Positive for dizziness and light-headedness.  Psychiatric/Behavioral: Positive for confusion and sleep disturbance. Negative for behavioral problems.  All other systems reviewed and are negative.  Physical Exam Updated Vital Signs BP 104/77   Pulse 87   Temp 98.3 F (36.8 C) (Oral)   Resp 16   LMP 05/07/2018   SpO2 99%   Physical Exam Vitals and nursing note reviewed.  Constitutional:      Appearance: She is well-developed.     Comments: Non-ill-appearing, flat affect  HENT:     Head: Normocephalic and atraumatic.     Mouth/Throat:     Mouth: Mucous membranes are moist.  Eyes:     Extraocular Movements: Extraocular movements intact.     Pupils: Pupils are equal, round, and reactive to light.  Cardiovascular:     Rate and Rhythm: Normal rate and regular rhythm.     Heart sounds: Normal heart sounds.  Pulmonary:     Effort: Pulmonary effort is normal. No respiratory distress.     Breath sounds: No wheezing.  Abdominal:     General: Bowel sounds are normal.     Palpations: Abdomen is soft.     Tenderness: There is no abdominal tenderness.  Musculoskeletal:     Cervical back: Neck supple.     Right lower leg: No edema.     Left lower leg: No edema.  Skin:    General: Skin is  warm and dry.  Neurological:     Mental Status: She is alert and oriented to person, place, and time.     Comments: Fluent speech, cranial nerves II through XII intact, at times hesitates to answer questions but answers them appropriately, 5 out of 5 strength in all 4 extremities, no dysmetria to finger-nose-finger  Psychiatric:     Comments: Flat depressed affect     ED Results / Procedures / Treatments   Labs (all labs ordered are listed, but only abnormal results are displayed) Labs Reviewed  BASIC METABOLIC PANEL - Abnormal; Notable for the following components:      Result Value   Sodium 132 (*)    Potassium 3.2 (*)    All other components within normal limits  CBC WITH DIFFERENTIAL/PLATELET  LACTIC ACID, PLASMA  LACTIC ACID, PLASMA  URINALYSIS, ROUTINE W REFLEX MICROSCOPIC    EKG EKG Interpretation  Date/Time:  Tuesday December 19 2019 21:27:18 EST Ventricular Rate:  84 PR Interval:  138 QRS Duration: 82 QT Interval:  360 QTC Calculation: 425 R Axis:   79 Text Interpretation: Normal sinus rhythm Normal ECG Confirmed by Thayer Jew [phone]) on 12/20/2019 5:12:32 AM   Radiology No results found.  Procedures Procedures (including critical care time)  Medications Ordered in ED Medications  sodium chloride 0.9 % bolus 1,000 mL (has no administration in time range)  potassium chloride 10 mEq in 100 mL IVPB (has no administration in time range)    ED Course  I have reviewed the triage vital signs and the nursing notes.  Pertinent labs & imaging results that were available during my care of the patient were reviewed by me and considered in my medical decision making (see chart for details).    MDM Rules/Calculators/A&P                       Patient presents with concerns for hypotension.  Also recent generalized fatigue, weight loss, decreased p.o. intake, dizziness.  She was diagnosed with COVID-19 at the end of December but denies any ongoing fevers,  shortness of breath, chest pain.  Initial blood pressure 86/64.  Blood pressure on my evaluation 104/77.  She has a very depressed  mood and flat affect.  I did not do formal memory testing but based on Dr. Clydene Fake evaluation, she did have some difficulty with short-term memory and scored 21 out of 30 on her Mini-Mental status exam.  She otherwise has no focal deficits.  EKG is without evidence of ischemia.  Basic lab work shows a slightly low sodium and potassium.  Patient was given a liter of fluids and one run of potassium.  No evidence of leukocytosis.  Doubt hypotension is related to acute infectious process.  Will obtain orthostatics, lactic acid, urinalysis.  Will volume replete and reassess.  Patient signed out to oncoming provider.  Ashley Beard was evaluated in Emergency Department on 12/20/2019 for the symptoms described in the history of present illness. She was evaluated in the context of the global COVID-19 pandemic, which necessitated consideration that the patient might be at risk for infection with the SARS-CoV-2 virus that causes COVID-19. Institutional protocols and algorithms that pertain to the evaluation of patients at risk for COVID-19 are in a state of rapid change based on information released by regulatory bodies including the CDC and federal and state organizations. These policies and algorithms were followed during the patient's care in the ED.   Final Clinical Impression(s) / ED Diagnoses Final diagnoses:  None    Rx / DC Orders ED Discharge Orders    None       Shelton Soler, Barbette Hair, MD 12/20/19 [phone]

## 2019-12-20 NOTE — Discharge Instructions (Signed)
Continue to hold your diovan and record your blood pressure.  Make sure you are eating and drinking regularly.  Today you received fluids and some potassium but otherwise your labs were very reassuring.

## 2019-12-20 NOTE — ED Notes (Signed)
Discharge instructions reviewed with the patient. The patient verbalized understanding of instructions. Pt discharged. 

## 2019-12-20 NOTE — ED Provider Notes (Signed)
Patient's lactate is within normal limits.  She received 2 L of fluid and hypotension has improved.  She was able to ambulate without dizziness.  Will discharge home continue to hold the Diovan and follow-up with her PCP.   Blanchie Dessert, MD 12/20/19 1030

## 2019-12-22 ENCOUNTER — Telehealth: Payer: Self-pay

## 2019-12-22 NOTE — Telephone Encounter (Signed)
FMLA paperwork completed. Pt's daughter aware. At pt's daughter's request, paperwork faxed to [employer], Attn: Lolita Lenz, at [phone]. Fax confirmation received. Copy of FMLA paperwork made to be scanned into the pt's chart, and at pt's daughter's request, the original is in an envelope at the front desk ready for pick up.

## 2019-12-25 ENCOUNTER — Other Ambulatory Visit: Payer: Self-pay

## 2019-12-25 ENCOUNTER — Ambulatory Visit: Payer: BC Managed Care – PPO

## 2019-12-25 DIAGNOSIS — R410 Disorientation, unspecified: Secondary | ICD-10-CM | POA: Diagnosis not present

## 2019-12-25 DIAGNOSIS — R42 Dizziness and giddiness: Secondary | ICD-10-CM

## 2019-12-25 DIAGNOSIS — R413 Other amnesia: Secondary | ICD-10-CM | POA: Diagnosis not present

## 2019-12-26 LAB — DEMENTIA PANEL
Homocysteine: 11.9 umol/L (ref 0.0–14.5)
RPR Ser Ql: NONREACTIVE
TSH: 1.81 u[IU]/mL (ref 0.450–4.500)
Vitamin B-12: 752 pg/mL (ref 232–1245)

## 2019-12-26 LAB — URINALYSIS, ROUTINE W REFLEX MICROSCOPIC
Bilirubin, UA: NEGATIVE
Glucose, UA: NEGATIVE
Leukocytes,UA: NEGATIVE
Nitrite, UA: NEGATIVE
Protein,UA: NEGATIVE
RBC, UA: NEGATIVE
Specific Gravity, UA: 1.009 (ref 1.005–1.030)
Urobilinogen, Ur: 0.2 mg/dL (ref 0.2–1.0)
pH, UA: 8 — ABNORMAL HIGH (ref 5.0–7.5)

## 2019-12-27 ENCOUNTER — Encounter: Payer: Self-pay | Admitting: Neurology

## 2019-12-27 NOTE — Progress Notes (Signed)
Kindly inform the patient that urinalysis and lab work for reversible causes of memory loss were normal

## 2019-12-29 ENCOUNTER — Encounter: Payer: Self-pay | Admitting: Osteopathic Medicine

## 2020-01-03 ENCOUNTER — Encounter: Payer: Self-pay | Admitting: Osteopathic Medicine

## 2020-01-03 ENCOUNTER — Ambulatory Visit (INDEPENDENT_AMBULATORY_CARE_PROVIDER_SITE_OTHER): Payer: BC Managed Care – PPO | Admitting: Osteopathic Medicine

## 2020-01-03 ENCOUNTER — Other Ambulatory Visit: Payer: Self-pay

## 2020-01-03 VITALS — BP 112/80 | HR 80 | Temp 97.7°F | Wt 126.1 lb

## 2020-01-03 DIAGNOSIS — F488 Other specified nonpsychotic mental disorders: Secondary | ICD-10-CM | POA: Diagnosis not present

## 2020-01-03 DIAGNOSIS — I952 Hypotension due to drugs: Secondary | ICD-10-CM

## 2020-01-03 DIAGNOSIS — R413 Other amnesia: Secondary | ICD-10-CM

## 2020-01-03 DIAGNOSIS — R4189 Other symptoms and signs involving cognitive functions and awareness: Secondary | ICD-10-CM

## 2020-01-03 NOTE — Progress Notes (Signed)
Ashley Beard is a 54 y.o. female who presents to  [place] at [facility]  today, 01/03/20, seeking care for the following:  The primary encounter diagnosis was Hypotension due to drugs. Diagnoses of Brain fog and Functional memory problem were also pertinent to this visit.    ASSESSMENT & PLAN with other pertinent history/findings:  1. Hypotension due to drugs Resolved, okay to discontinue valsartan-hydrochlorothiazide.  2. Brain fog 3. Functional memory problem Patient states that this was an issue prior to her Covid infection, but worse since then.  She is certainly on multiple medications which are not doing her any favors, she has seen neurology and she also has a headache specialist.  See patient instructions below.      Orders Placed This Encounter  Procedures  . CBC  . COMPLETE METABOLIC PANEL WITH GFR      Patient Instructions  Plan:  We will stay off of the blood pressure medication.  I have removed the valsartan-hydrochlorothiazide from your medication list.  Let us have you follow-up in the office in 2 to 4 weeks, bring your home blood pressure monitor with you to that visit so that we can check its accuracy.  You are on several medications that can affect your mental state, memory, etc.  I think speaking with your headache doctor about making some adjustments to these medicines might help your symptoms.  Please reach out to them about setting up an appointment.       Follow-up instructions: Return in about 4 weeks (around 01/31/2020) for Blood pressure follow-up with Dr. Sheppard Coil, sooner if needed.        BP 112/80 (BP Location: Left Arm, Patient Position: Sitting, Cuff Size: Normal)   Pulse 80   Temp 97.7 F (36.5 C) (Oral)   Wt 126 lb 1.9 oz (57.2 kg)   LMP 05/07/2018   BMI 25.47 kg/m   Current Meds  Medication Sig  . amitriptyline (ELAVIL) 25 MG tablet Take 2 tablets (50 mg total) by mouth at  bedtime.  Marland Kitchen aspirin 325 MG tablet Take 1 tablet (325 mg total) by mouth daily.  Marland Kitchen atorvastatin (LIPITOR) 40 MG tablet Take 1 tablet (40 mg total) by mouth daily.  . baclofen (LIORESAL) 10 MG tablet Take 1 tablet (10 mg total) by mouth 3 (three) times daily as needed for muscle spasms (headache, neck pain).  Marland Kitchen divalproex (DEPAKOTE ER) 500 MG 24 hr tablet Take 1 tablet (500 mg total) by mouth daily.  Marland Kitchen PARoxetine (PAXIL) 20 MG tablet Take 1 tablet by mouth daily.  . potassium chloride (KLOR-CON) 10 MEQ tablet TAKE 1 TABLET BY MOUTH DAILY  . topiramate (TOPAMAX) 100 MG tablet Take 1 pill QHS x 1wk then may increase to 1.5 pills  . topiramate (TOPAMAX) 100 MG tablet Take 0.5 tablets (50 mg total) by mouth at bedtime.  Marland Kitchen UBRELVY 50 MG TABS   . [DISCONTINUED] valsartan-hydrochlorothiazide (DIOVAN-HCT) 160-12.5 MG tablet Take 1 tablet by mouth daily.    No results found for this or any previous visit (from the past 72 hour(s)).  No results found.  Depression screen [facility] 2/9 01/03/2020 10/17/2019  Decreased Interest 0 3  Down, Depressed, Hopeless 0 2  PHQ - 2 Score 0 5  Altered sleeping 3 2  Tired, decreased energy 3 2  Change in appetite 0 2  Feeling bad or failure about yourself  0 2  Trouble concentrating 0 3  Moving slowly or fidgety/restless 0 2  Suicidal thoughts 0 0  PHQ-9 Score 6 18  Difficult doing work/chores Somewhat difficult Very difficult    GAD 7 : Generalized Anxiety Score 01/03/2020 10/17/2019  Nervous, Anxious, on Edge 0 0  Control/stop worrying 0 3  Worry too much - different things 0 3  Trouble relaxing 0 3  Restless 0 2  Easily annoyed or irritable 0 3  Afraid - awful might happen 3 3  Total GAD 7 Score 3 17  Anxiety Difficulty Somewhat difficult Very difficult      All questions at time of visit were answered - patient instructed to contact office with any additional concerns or updates.  ER/RTC precautions were reviewed with the patient.  Please note: voice  recognition software was used to produce this document, and typos may escape review. Please contact Dr. Sheppard Coil for any needed clarifications.   Total encounter time: 30 minutes.

## 2020-01-03 NOTE — Progress Notes (Signed)
Attempted to contact pt at 906 am. No answer. Went straight to [employer].

## 2020-01-03 NOTE — Patient Instructions (Signed)
Plan:  We will stay off of the blood pressure medication.  I have removed the valsartan-hydrochlorothiazide from your medication list.  Let us have you follow-up in the office in 2 to 4 weeks, bring your home blood pressure monitor with you to that visit so that we can check its accuracy.  You are on several medications that can affect your mental state, memory, etc.  I think speaking with your headache doctor about making some adjustments to these medicines might help your symptoms.  Please reach out to them about setting up an appointment.

## 2020-01-08 ENCOUNTER — Telehealth: Payer: Self-pay | Admitting: Osteopathic Medicine

## 2020-01-08 ENCOUNTER — Telehealth: Payer: Self-pay

## 2020-01-08 NOTE — Telephone Encounter (Signed)
Letter to return back to work completed. Pt has been updated that letter available for pick up at front desk. No other inquiries during call.

## 2020-01-08 NOTE — Telephone Encounter (Signed)
I think patient should be okay to return to work, if she is still having issues at work we can reevaluate/she can work on obtaining disability

## 2020-01-08 NOTE — Telephone Encounter (Signed)
PT needs a letter to return back to work. 01/10/20 for the return date.  Please advise.

## 2020-01-08 NOTE — Telephone Encounter (Signed)
Pt called stating she will need a work note. As per pt, she is scheduled to return on 01/10/20. She recently had provider fill out FMLA forms. If provider feels it is appropriate to return to work on that date, pt is ok with that decision or if she should continue to stay out of work. Pt is leaving it up the provider to decide what is best. Pls advise, thanks.

## 2020-01-23 ENCOUNTER — Encounter: Payer: Self-pay | Admitting: Osteopathic Medicine

## 2020-01-23 DIAGNOSIS — E876 Hypokalemia: Secondary | ICD-10-CM

## 2020-01-23 NOTE — Telephone Encounter (Signed)
Need labs before RF?

## 2020-01-24 MED ORDER — POTASSIUM CHLORIDE ER 10 MEQ PO TBCR: 10.0000 meq | EXTENDED_RELEASE_TABLET | Freq: Every day | ORAL | 3 refills | Status: DC

## 2020-01-26 ENCOUNTER — Telehealth: Payer: Self-pay

## 2020-01-26 NOTE — Telephone Encounter (Signed)
Tried calling pt for clarification on med refill for valsartan/hctz. Pt aware to return a call back to the clinic to verify whether she is taking the med or if its an auto med request from pharmacy. Direct call back info provided.

## 2020-01-26 NOTE — Telephone Encounter (Signed)
Would confirm w/ patient, I don't see this Rx since 05/2019 (#90 w/ 0 refills at that time)

## 2020-01-26 NOTE — Telephone Encounter (Signed)
Second Rx request [facility] pharmacy requesting med refill for valsartan/hctz. Unable to refill. Rx not listed in active med list.

## 2020-01-26 NOTE — Telephone Encounter (Signed)
Pt did return a call back to clinic. However pt did not leave any specific details regarding valsartan/hctz rx. Pt did requested a call back. Attempted to contact pt, no answer. Left a detailed vm msg for pt to please clarify current med refill request. Direct call back info provided.

## 2020-01-31 ENCOUNTER — Other Ambulatory Visit: Payer: Self-pay

## 2020-01-31 ENCOUNTER — Encounter: Payer: Self-pay | Admitting: Osteopathic Medicine

## 2020-01-31 ENCOUNTER — Ambulatory Visit (INDEPENDENT_AMBULATORY_CARE_PROVIDER_SITE_OTHER): Payer: BC Managed Care – PPO | Admitting: Osteopathic Medicine

## 2020-01-31 ENCOUNTER — Telehealth: Payer: Self-pay | Admitting: Osteopathic Medicine

## 2020-01-31 VITALS — BP 119/84 | HR 102 | Temp 97.8°F | Ht 59.0 in | Wt 127.8 lb

## 2020-01-31 DIAGNOSIS — G43709 Chronic migraine without aura, not intractable, without status migrainosus: Secondary | ICD-10-CM

## 2020-01-31 DIAGNOSIS — Z8673 Personal history of transient ischemic attack (TIA), and cerebral infarction without residual deficits: Secondary | ICD-10-CM

## 2020-01-31 DIAGNOSIS — G4489 Other headache syndrome: Secondary | ICD-10-CM

## 2020-01-31 DIAGNOSIS — I952 Hypotension due to drugs: Secondary | ICD-10-CM | POA: Diagnosis not present

## 2020-01-31 DIAGNOSIS — D320 Benign neoplasm of cerebral meninges: Secondary | ICD-10-CM | POA: Diagnosis not present

## 2020-01-31 DIAGNOSIS — IMO0002 Reserved for concepts with insufficient information to code with codable children: Secondary | ICD-10-CM

## 2020-01-31 MED ORDER — HYDROCHLOROTHIAZIDE 12.5 MG PO TABS: 12.5000 mg | ORAL_TABLET | Freq: Every day | ORAL | 0 refills | Status: DC

## 2020-01-31 NOTE — Telephone Encounter (Signed)
Patient has history of migraine and stroke.  She is seeing Sharlet Salina, PA, at [facility] headache clinic as well as a physician at [facility] neurology Dr Leonie Man for post stroke care.  This seems a bit redundant.  Patient is not happy with [facility] neurology    I would like to have [place] reach out to [name]'s (see below) office to ask  Can  they can absorb her other neurology issues or   Do they have a neurologist that they would recommend her seeing (we can place a referral if needed.)  Thanks!     Val Riles, PA-C  [address] [place]  [place],  [address]  [phone]  [phone] (Fax)

## 2020-01-31 NOTE — Telephone Encounter (Signed)
Left a detailed vm msg on [place] (PA Fox's nurse) voicemail. Aware of provider's request / recommendations. Direct call back and fax info provided.

## 2020-01-31 NOTE — Patient Instructions (Addendum)
Let me reach out to Sharlet Salina - since you're also seeing Dr Leonie Man I don't think it sense to be at 2 different neurology practices though of course [name] works mostly with headache patients . If [name] feels comfortable managing the other neurology issues you have I think we can keep your neurology care with her if you're happy with her or she might have ideas for an appropriate second opinion.   Let's restart just Hydrochlorothiazide daily instead of taking the valsartan-hydrochlorothiazide "as needed"

## 2020-01-31 NOTE — Progress Notes (Signed)
Ashley Beard is a 54 y.o. female who presents to  [place] at [facility]  today, 01/31/20, seeking care for the following:  The primary encounter diagnosis was Hypotension due to drugs. Diagnoses of Chronic migraine, Meningioma, cerebral ([place]), Other headache syndrome, and History of cerebrovascular accident (CVA) from left carotid artery occlusion involving left middle cerebral artery territory were also pertinent to this visit.     ASSESSMENT & PLAN with other pertinent history/findings:  1. Hypotension due to drugs  Last visit we ad stopped the Valsartan-HCT due to symptomatic hypotension.  Patient states now that she is taking it "as needed" when systolic is greater than [id number].  Plan: We will try just hydrochlorothiazide and take daily consistently hopefully this can keep the blood pressure stable/at goal of 130/80 or less  2. Chronic migraine 3. Meningioma, cerebral (HCC) 4. Other headache syndrome 5. History of cerebrovascular accident (CVA) from left carotid artery occlusion involving left middle cerebral artery territory  Multiple neurologic issues.  Patient is currently seeing a neurologist as well as a headache specialist which seems like unnecessary overlap to me, she was not happy with her previous neurologist and requests a new referral.  I will have one of our staff members reach out to her headache clinic, to see if there is someone that they work closely with/would recommend.    Meds ordered this encounter  Medications  . hydrochlorothiazide (HYDRODIURIL) 12.5 MG tablet    Sig: Take 1 tablet (12.5 mg total) by mouth daily.    Dispense:  30 tablet    Refill:  0    CANCEL VALSARTAN-HCT thanks    Patient Instructions  Let me reach out to Sharlet Salina - since you're also seeing Dr Leonie Man I don't think it sense to be at 2 different neurology practices though of course [name] works mostly with headache patients . If [name]  feels comfortable managing the other neurology issues you have I think we can keep your neurology care with her if you're happy with her or she might have ideas for an appropriate second opinion.   Let's restart just Hydrochlorothiazide daily instead of taking the valsartan-hydrochlorothiazide "as needed"     Follow-up instructions: Return in about 2 weeks (around 02/14/2020) for nurse visit BP check - NEED TO BRING HOME BP MACHINE TO THIS VISIT! .                               BP 119/84   Pulse (!) 102   Temp 97.8 F (36.6 C)   Ht 4\' 11"  (1.499 m)   Wt 127 lb 12.8 oz (58 kg)   LMP 05/07/2018   SpO2 100%   BMI 25.81 kg/m   Current Meds  Medication Sig  . amitriptyline (ELAVIL) 25 MG tablet Take 2 tablets (50 mg total) by mouth at bedtime.  Marland Kitchen aspirin 325 MG tablet Take 1 tablet (325 mg total) by mouth daily.  Marland Kitchen atorvastatin (LIPITOR) 40 MG tablet Take 1 tablet (40 mg total) by mouth daily.  . baclofen (LIORESAL) 10 MG tablet Take 1 tablet (10 mg total) by mouth 3 (three) times daily as needed for muscle spasms (headache, neck pain).  Marland Kitchen divalproex (DEPAKOTE ER) 500 MG 24 hr tablet Take 1 tablet (500 mg total) by mouth daily.  Marland Kitchen PARoxetine (PAXIL) 20 MG tablet Take 1 tablet by mouth daily.  . potassium chloride (KLOR-CON)  10 MEQ tablet Take 1 tablet (10 mEq total) by mouth daily.  Marland Kitchen topiramate (TOPAMAX) 100 MG tablet Take 1 pill QHS x 1wk then may increase to 1.5 pills  . UBRELVY 50 MG TABS     No results found for this or any previous visit (from the past 72 hour(s)).  No results found.  Depression screen [facility] 2/9 01/03/2020 10/17/2019  Decreased Interest 0 3  Down, Depressed, Hopeless 0 2  PHQ - 2 Score 0 5  Altered sleeping 3 2  Tired, decreased energy 3 2  Change in appetite 0 2  Feeling bad or failure about yourself  0 2  Trouble concentrating 0 3  Moving slowly or fidgety/restless 0 2  Suicidal thoughts 0 0  PHQ-9 Score 6 18  Difficult  doing work/chores Somewhat difficult Very difficult    GAD 7 : Generalized Anxiety Score 01/03/2020 10/17/2019  Nervous, Anxious, on Edge 0 0  Control/stop worrying 0 3  Worry too much - different things 0 3  Trouble relaxing 0 3  Restless 0 2  Easily annoyed or irritable 0 3  Afraid - awful might happen 3 3  Total GAD 7 Score 3 17  Anxiety Difficulty Somewhat difficult Very difficult      All questions at time of visit were answered - patient instructed to contact office with any additional concerns or updates.  ER/RTC precautions were reviewed with the patient.  Please note: voice recognition software was used to produce this document, and typos may escape review. Please contact Dr. Sheppard Coil for any needed clarifications.   Total encounter time: 30 minutes.

## 2020-02-12 DIAGNOSIS — F419 Anxiety disorder, unspecified: Secondary | ICD-10-CM | POA: Diagnosis not present

## 2020-02-12 DIAGNOSIS — G43709 Chronic migraine without aura, not intractable, without status migrainosus: Secondary | ICD-10-CM | POA: Diagnosis not present

## 2020-02-12 DIAGNOSIS — F329 Major depressive disorder, single episode, unspecified: Secondary | ICD-10-CM | POA: Diagnosis not present

## 2020-02-12 DIAGNOSIS — D329 Benign neoplasm of meninges, unspecified: Secondary | ICD-10-CM | POA: Diagnosis not present

## 2020-02-13 ENCOUNTER — Encounter: Payer: Self-pay | Admitting: Osteopathic Medicine

## 2020-02-14 ENCOUNTER — Other Ambulatory Visit: Payer: Self-pay

## 2020-02-14 ENCOUNTER — Ambulatory Visit (INDEPENDENT_AMBULATORY_CARE_PROVIDER_SITE_OTHER): Payer: BC Managed Care – PPO | Admitting: Osteopathic Medicine

## 2020-02-14 DIAGNOSIS — I9589 Other hypotension: Secondary | ICD-10-CM

## 2020-02-14 NOTE — Progress Notes (Signed)
Pt here for BP check. Denies any CP,SOB, lightheadedness. I asked her if she brought in her home bp cuff she stated that she forgot to bring in her BP cuff in with her today. I advised her to send in her home BP readings and also the reading that she gets today on her home cuff. She said that she would send this via my chart.    She is doing well on her current regimen and hasn't had any hypotensive events.   She asked about the referral that was placed for her to be seen by another Neurologist and asked if I would get her that information. I did locate this and gave this to her so that she can see if they are in her network.   Pt asked about getting a Tdap done today because her daughters are expecting and she will need this in order to be around them and the babies. I informed her that she has already had this done on 02/22/2017 and would not need another one until March of 2028.  Maryruth Eve, [place]ahoma Crocker, CMA

## 2020-02-15 NOTE — Progress Notes (Signed)
BP Readings from Last 3 Encounters:  02/14/20 124/67  01/31/20 119/84  01/03/20 112/80   Looks good!

## 2020-02-23 ENCOUNTER — Emergency Department (HOSPITAL_COMMUNITY): Payer: BC Managed Care – PPO

## 2020-02-23 ENCOUNTER — Other Ambulatory Visit: Payer: Self-pay

## 2020-02-23 ENCOUNTER — Emergency Department (HOSPITAL_COMMUNITY)
Admission: EM | Admit: 2020-02-23 | Discharge: 2020-02-23 | Disposition: A | Discharge status: Home / Self Care | Payer: BC Managed Care – PPO | Attending: Emergency Medicine | Admitting: Emergency Medicine

## 2020-02-23 ENCOUNTER — Encounter (HOSPITAL_COMMUNITY): Payer: Self-pay | Admitting: Emergency Medicine

## 2020-02-23 DIAGNOSIS — Z8673 Personal history of transient ischemic attack (TIA), and cerebral infarction without residual deficits: Secondary | ICD-10-CM | POA: Insufficient documentation

## 2020-02-23 DIAGNOSIS — Z87891 Personal history of nicotine dependence: Secondary | ICD-10-CM | POA: Diagnosis not present

## 2020-02-23 DIAGNOSIS — D329 Benign neoplasm of meninges, unspecified: Secondary | ICD-10-CM | POA: Diagnosis not present

## 2020-02-23 DIAGNOSIS — Z79899 Other long term (current) drug therapy: Secondary | ICD-10-CM | POA: Insufficient documentation

## 2020-02-23 DIAGNOSIS — Z7982 Long term (current) use of aspirin: Secondary | ICD-10-CM | POA: Diagnosis not present

## 2020-02-23 DIAGNOSIS — R202 Paresthesia of skin: Secondary | ICD-10-CM

## 2020-02-23 DIAGNOSIS — R200 Anesthesia of skin: Secondary | ICD-10-CM | POA: Diagnosis not present

## 2020-02-23 LAB — DIFFERENTIAL
Abs Immature Granulocytes: 0.02 10*3/uL (ref 0.00–0.07)
Basophils Absolute: 0.1 10*3/uL (ref 0.0–0.1)
Basophils Relative: 1 %
Eosinophils Absolute: 0.1 10*3/uL (ref 0.0–0.5)
Eosinophils Relative: 1 %
Immature Granulocytes: 0 %
Lymphocytes Relative: 29 %
Lymphs Abs: 1.7 10*3/uL (ref 0.7–4.0)
Monocytes Absolute: 0.4 10*3/uL (ref 0.1–1.0)
Monocytes Relative: 6 %
Neutro Abs: 3.5 10*3/uL (ref 1.7–7.7)
Neutrophils Relative %: 63 %

## 2020-02-23 LAB — I-STAT CHEM 8, ED
BUN: 14 mg/dL (ref 6–20)
Calcium, Ion: 1.23 mmol/L (ref 1.15–1.40)
Chloride: 101 mmol/L (ref 98–111)
Creatinine, Ser: 0.8 mg/dL (ref 0.44–1.00)
Glucose, Bld: 74 mg/dL (ref 70–99)
HCT: 43 % (ref 36.0–46.0)
Hemoglobin: 14.6 g/dL (ref 12.0–15.0)
Potassium: 3.4 mmol/L — ABNORMAL LOW (ref 3.5–5.1)
Sodium: 140 mmol/L (ref 135–145)
TCO2: 28 mmol/L (ref 22–32)

## 2020-02-23 LAB — CBC
HCT: 42.1 % (ref 36.0–46.0)
Hemoglobin: 13.5 g/dL (ref 12.0–15.0)
MCH: 31.4 pg (ref 26.0–34.0)
MCHC: 32.1 g/dL (ref 30.0–36.0)
MCV: 97.9 fL (ref 80.0–100.0)
Platelets: 232 10*3/uL (ref 150–400)
RBC: 4.3 MIL/uL (ref 3.87–5.11)
RDW: 13.6 % (ref 11.5–15.5)
WBC: 5.7 10*3/uL (ref 4.0–10.5)
nRBC: 0 % (ref 0.0–0.2)

## 2020-02-23 LAB — COMPREHENSIVE METABOLIC PANEL
ALT: 23 U/L (ref 0–44)
AST: 26 U/L (ref 15–41)
Albumin: 3.6 g/dL (ref 3.5–5.0)
Alkaline Phosphatase: 69 U/L (ref 38–126)
Anion gap: 11 (ref 5–15)
BUN: 13 mg/dL (ref 6–20)
CO2: 28 mmol/L (ref 22–32)
Calcium: 9.2 mg/dL (ref 8.9–10.3)
Chloride: 102 mmol/L (ref 98–111)
Creatinine, Ser: 0.88 mg/dL (ref 0.44–1.00)
GFR calc Af Amer: >60 mL/min (ref >60)
GFR calc non Af Amer: >60 mL/min (ref >60)
Glucose, Bld: 85 mg/dL (ref 70–99)
Potassium: 3.6 mmol/L (ref 3.5–5.1)
Sodium: 141 mmol/L (ref 135–145)
Total Bilirubin: 0.3 mg/dL (ref 0.3–1.2)
Total Protein: 7 g/dL (ref 6.5–8.1)

## 2020-02-23 LAB — APTT: aPTT: 28 seconds (ref 24–36)

## 2020-02-23 LAB — CBG MONITORING, ED
Glucose-Capillary: 87 mg/dL (ref 70–99)
Glucose-Capillary: 72 mg/dL (ref 70–99)

## 2020-02-23 LAB — PROTIME-INR
INR: 0.9 (ref 0.8–1.2)
Prothrombin Time: 11.9 seconds (ref 11.4–15.2)

## 2020-02-23 LAB — I-STAT BETA HCG BLOOD, ED (MC, WL, AP ONLY): I-stat hCG, quantitative: <5 m[IU]/mL (ref <5)

## 2020-02-23 MED ORDER — SODIUM CHLORIDE 0.9% FLUSH
3.0000 mL | Freq: Once | INTRAVENOUS | Status: AC
Start: 2020-02-23 — End: 2020-02-23
  Administered 2020-02-23: 3 mL via INTRAVENOUS

## 2020-02-23 NOTE — ED Triage Notes (Signed)
Pt reports Numbness to her L arm that began at 7pm last night, states numbness in both legs started when she laid down for bed around 930. Denies any weakness, numbness in face, difficulty with speech. Endorses only numbness to fingers on L hand at this time. Reports hx of cva in 2018 which she has near chronic headaches from. Pt a/ox4, resp e/u, speech clear, face symmetrical, denies vision changes.

## 2020-02-23 NOTE — ED Provider Notes (Signed)
[facility] EMERGENCY DEPARTMENT Provider Note   CSN: [id number] Arrival date & time: 02/23/20  1039     History Chief Complaint  Patient presents with  . Numbness    Ashley Beard is a 54 y.o. female.  HPI   Patient presents with concern of numbness.  She has multiple medical issues including meningioma, prior stroke.  She notes that she is currently taking aspirin for stroke prophylaxis, having recently been on additional thinning agents. Yesterday she developed, without precipitant, left arm numbness.  This was subsequently followed by development of numbness in both lower extremities.  There is no true weakness no speech difficulty until later.  Symptoms have been present for about 18 hours prior to my evaluation.  No pain, no confusion, no vision changes.  Past Medical History:  Diagnosis Date  . Abnormal Pap smear of cervix    age 55  . Anxiety   . Carotid arterial disease ([place])   . Headache   . Meningioma, cerebral ([place]) 08/18/2018   5 x 12 mm meningioma left middle cranial fossa similar in size to the prior MRI of 02/25/2017  . Refusal of blood transfusions as patient is Jehovah's Witness   . Stroke ([place])   . Trigeminal neuralgia 06/22/2017   Dx by neuro. Takes Tegretol prn    Patient Active Problem List   Diagnosis Date Noted  . Hypotension 12/19/2019  . Encounter for completion of form with patient 12/19/2019  . Caregiver stress syndrome 02/13/2019  . Chronic migraine 09/15/2018  . Hypokalemia due to loss of potassium 08/29/2018  . Meningioma, cerebral ([place]) 08/18/2018  . Encounter for medication management 06/25/2018  . Other headache syndrome 06/13/2018  . Abnormal mammogram of left breast 10/06/2017  . Trigeminal neuralgia 06/22/2017  . Right homonymous hemianopsia 03/16/2017  . History of cerebrovascular accident (CVA) from left carotid artery occlusion involving left middle cerebral artery territory 02/26/2017  . Hypertension goal BP  (blood pressure) < 130/80 02/22/2017  . Carotid artery disease ([place]) 02/22/2017    Past Surgical History:  Procedure Laterality Date  . ENDARTERECTOMY Left 02/24/2017   Procedure: ENDARTERECTOMY CAROTID with removal of thrombus ;  Surgeon: Rosetta Posner, MD;  Location: [facility];  Service: Vascular;  Laterality: Left;  . GYNECOLOGIC CRYOSURGERY     age 88  . IR GENERIC HISTORICAL  02/25/2017   IR ANGIO VERTEBRAL SEL SUBCLAVIAN INNOMINATE UNI R MOD SED 02/25/2017 [name], MD [facility]-INTERV RAD  . IR GENERIC HISTORICAL  02/25/2017   IR ANGIO INTRA EXTRACRAN SEL COM CAROTID INNOMINATE BILAT MOD SED 02/25/2017 [name], MD [facility]INTER[facility]  . OOPHORECTOMY    . TEE WITHOUT CARDIOVERSION N/A 05/04/2017   Procedure: TRANSESOPHAGEAL ECHOCARDIOGRAM (TEE);  Surgeon: Lelon Perla, MD;  Location: [facility] ENDOSCOPY;  Service: Cardiovascular;  Laterality: N/A;     OB History   No obstetric history on file.    Obstetric Comments  N/A        Family History  Problem Relation Age of Onset  . Diabetes Maternal Grandmother        Died of heart attack  . Hypertension Maternal Grandmother   . Colon cancer Maternal Grandmother   . Stroke Paternal Grandfather   . Esophageal cancer Neg Hx     Social History   Tobacco Use  . Smoking status: Former Smoker    Types: Cigarettes    Quit date: 09/2016    Years since quitting: 3.4  . Smokeless tobacco: Never Used  .  Tobacco comment: very rare, social  Substance Use Topics  . Alcohol use: Yes    Alcohol/week: 2.0 standard drinks    Types: 2 Cans of beer per week    Comment: social  . Drug use: No    Home Medications Prior to Admission medications   Medication Sig Start Date End Date Taking? Authorizing Provider  amitriptyline (ELAVIL) 25 MG tablet Take 2 tablets (50 mg total) by mouth at bedtime. Patient taking differently: Take 25 mg by mouth at bedtime.  02/13/19  Yes Trixie Dredge, PA-C  aspirin 325 MG tablet Take 1 tablet  (325 mg total) by mouth daily. 09/05/18  Yes Trixie Dredge, PA-C  atorvastatin (LIPITOR) 40 MG tablet Take 1 tablet (40 mg total) by mouth daily. 10/17/19  Yes Emeterio Reeve, DO  baclofen (LIORESAL) 10 MG tablet Take 1 tablet (10 mg total) by mouth 3 (three) times daily as needed for muscle spasms (headache, neck pain). 08/03/19 08/02/20 Yes Trixie Dredge, PA-C  divalproex (DEPAKOTE ER) 500 MG 24 hr tablet Take 1 tablet (500 mg total) by mouth daily. 12/18/19  Yes Garvin Fila, MD  hydrochlorothiazide (HYDRODIURIL) 12.5 MG tablet Take 1 tablet (12.5 mg total) by mouth daily. 01/31/20  Yes Emeterio Reeve, DO  Multiple Vitamin (MULTIVITAMIN) capsule Take 1 capsule by mouth daily.   Yes [provider]  Omega-3 Fatty Acids (FISH OIL) 500 MG CAPS Take 1 capsule by mouth daily.   Yes [provider]  PARoxetine (PAXIL) 20 MG tablet Take 20 mg by mouth daily.  04/05/19  Yes [provider]  potassium chloride (KLOR-CON) 10 MEQ tablet Take 1 tablet (10 mEq total) by mouth daily. 01/24/20  Yes Emeterio Reeve, DO  topiramate (TOPAMAX) 100 MG tablet Take 100 mg by mouth at bedtime.  07/05/19  Yes [provider]  UBRELVY 50 MG TABS  09/05/19  Yes [provider]    Allergies    Penicillins, Fish allergy, Pineapple, Wheat bran, Other, Shrimp [shellfish allergy], and Yeast-related products  Review of Systems   Review of Systems  Constitutional:       Per HPI, otherwise negative  HENT:       Per HPI, otherwise negative  Respiratory:       Per HPI, otherwise negative  Cardiovascular:       Per HPI, otherwise negative  Gastrointestinal: Negative for vomiting.  Endocrine:       Negative aside from HPI  Genitourinary:       Neg aside from HPI   Musculoskeletal:       Per HPI, otherwise negative  Skin: Negative.   Neurological: Positive for numbness. Negative for syncope.    Physical Exam Updated Vital Signs BP  131/82   Pulse 72   Temp 97.8 F (36.6 C) (Oral)   Resp 12   LMP 05/07/2018   SpO2 100%   Physical Exam Vitals and nursing note reviewed.  Constitutional:      General: She is not in acute distress.    Appearance: She is well-developed.  HENT:     Head: Normocephalic and atraumatic.  Eyes:     Conjunctiva/sclera: Conjunctivae normal.  Cardiovascular:     Rate and Rhythm: Normal rate and regular rhythm.  Pulmonary:     Effort: Pulmonary effort is normal. No respiratory distress.     Breath sounds: Normal breath sounds. No stridor.  Abdominal:     General: There is no distension.  Skin:    General: Skin  is warm and dry.  Neurological:     Mental Status: She is alert and oriented to person, place, and time.     Cranial Nerves: No cranial nerve deficit.     Motor: No weakness, tremor, atrophy, abnormal muscle tone or seizure activity.     Coordination: Coordination normal.     ED Results / Procedures / Treatments   Labs (all labs ordered are listed, but only abnormal results are displayed) Labs Reviewed  I-STAT CHEM 8, ED - Abnormal; Notable for the following components:      Result Value   Potassium 3.4 (*)    All other components within normal limits  PROTIME-INR  APTT  CBC  DIFFERENTIAL  COMPREHENSIVE METABOLIC PANEL  CBG MONITORING, ED  CBG MONITORING, ED  I-STAT BETA HCG BLOOD, ED ([facility], [facility], [facility] ONLY)    EKG EKG Interpretation  Date/Time:  Friday February 23 2020 10:53:07 EDT Ventricular Rate:  90 PR Interval:  134 QRS Duration: 70 QT Interval:  370 QTC Calculation: 452 R Axis:   74 Text Interpretation: Normal sinus rhythm with sinus arrhythmia Nonspecific ST abnormality Abnormal ECG Confirmed by Carmin Muskrat [phone]) on 02/23/2020 4:22:55 PM   Radiology CT HEAD WO CONTRAST  Result Date: 02/23/2020 CLINICAL DATA:  Numbness or tingling to the left arm that began last night EXAM: CT HEAD WITHOUT CONTRAST TECHNIQUE: Contiguous axial images were obtained  from the base of the skull through the vertex without intravenous contrast. COMPARISON:  Brain MRI 10/11/2019 FINDINGS: Brain: No evidence of acute infarction, hemorrhage, hydrocephalus, extra-axial collection or mass effect. Patchy remote left cerebral infarcts along the frontal, parietal, and occipital convexity. There is a meningioma by MRI at the left middle cranial fossa, not apparent on this noncontrast CT Vascular: No hyperdense vessel or unexpected calcification. Skull: Normal. Negative for fracture or focal lesion. Sinuses/Orbits: No acute finding. IMPRESSION: 1. No acute finding or explanation for symptoms. 2. Remote cortical infarcts along the left cerebral convexity. Electronically Signed   By: Monte Fantasia M.D.   On: 02/23/2020 11:34   MR Brain Wo Contrast (neuro protocol)  Result Date: 02/23/2020 CLINICAL DATA:  Numbness of the upper extremities left worse than right. EXAM: MRI HEAD WITHOUT CONTRAST TECHNIQUE: Multiplanar, multiecho pulse sequences of the brain and surrounding structures were obtained without intravenous contrast. COMPARISON:  Head CT earlier same day.  MRI 10/11/2019. FINDINGS: Brain: Diffusion imaging does not show any acute or subacute infarction. No abnormality is seen affecting the brainstem or cerebellum. Right cerebral hemisphere is normal. There are old cortical and subcortical infarctions of the left frontal, parietal and parietooccipital regions. These have progressed to atrophy, encephalomalacia and gliosis. No change since the prior study. Incidental 8 mm meningioma along the lateral convexity at the temporal region is unchanged. No significant mass-effect upon the brain. No hemorrhage, hydrocephalus or extra-axial collection. Vascular: Major vessels at the base of the brain show flow. Skull and upper cervical spine: Negative Sinuses/Orbits: Clear/normal Other: None IMPRESSION: No acute or subacute finding. Multiple old infarctions in the left hemisphere as seen on  prior studies. 8 mm meningioma along the convexity on the left in the temporal region, unchanged without significant mass-effect upon the brain. Electronically Signed   By: Nelson Chimes M.D.   On: 02/23/2020 15:21    Procedures Procedures (including critical care time)  Medications Ordered in ED Medications  sodium chloride flush (NS) 0.9 % injection 3 mL (3 mLs Intravenous Given 02/23/20 1301)    ED Course  I  have reviewed the triage vital signs and the nursing notes.  Pertinent labs & imaging results that were available during my care of the patient were reviewed by me and considered in my medical decision making (see chart for details).    MDM Rules/Calculators/A&P                      4:22 PM Patient in no distress, awake, alert, speaking clearly. We reviewed all findings.  No MRI evidence for stroke, CT reassuring, no evidence for bleed, mass. Labs reassuring, and neuro deficits are more subjective than objective.  Some suspicion for acute on chronic episode of the patient's headaches, possible atypical migraine contributing to today's episode, but given the aforementioned reassuring findings, patient is amenable to, appropriate for outpatient follow-up with her neurologist. Final Clinical Impression(s) / ED Diagnoses Final diagnoses:  Paresthesia     Carmin Muskrat, MD 02/23/20 1623

## 2020-02-23 NOTE — ED Notes (Signed)
Patient verbalizes understanding of discharge instructions. Opportunity for questioning and answers were provided. Armband removed by staff, pt discharged from ED to home 

## 2020-02-23 NOTE — Discharge Instructions (Signed)
As discussed, your evaluation today has been largely reassuring.  But, it is important that you monitor your condition carefully, and do not hesitate to return to the ED if you develop new, or concerning changes in your condition.  Otherwise, please follow-up with your neurologist for appropriate ongoing care.

## 2020-03-04 ENCOUNTER — Other Ambulatory Visit: Payer: Self-pay

## 2020-03-04 MED ORDER — HYDROCHLOROTHIAZIDE 12.5 MG PO TABS: 12.5000 mg | ORAL_TABLET | Freq: Every day | ORAL | 0 refills | Status: DC

## 2020-03-11 ENCOUNTER — Ambulatory Visit: Payer: Self-pay | Admitting: Neurology

## 2020-03-20 ENCOUNTER — Encounter: Payer: Self-pay | Admitting: Osteopathic Medicine

## 2020-03-20 ENCOUNTER — Other Ambulatory Visit: Payer: Self-pay

## 2020-03-20 MED ORDER — HYDROCHLOROTHIAZIDE 12.5 MG PO TABS: 12.5000 mg | ORAL_TABLET | Freq: Every day | ORAL | 0 refills | Status: DC

## 2020-04-21 ENCOUNTER — Other Ambulatory Visit: Payer: Self-pay | Admitting: Neurology

## 2020-04-22 ENCOUNTER — Other Ambulatory Visit: Payer: Self-pay

## 2020-04-22 MED ORDER — TOPIRAMATE 100 MG PO TABS: 100.0000 mg | ORAL_TABLET | Freq: Every day | ORAL | 6 refills | Status: DC

## 2020-05-14 ENCOUNTER — Encounter: Payer: Self-pay | Admitting: Osteopathic Medicine

## 2020-06-06 ENCOUNTER — Ambulatory Visit (INDEPENDENT_AMBULATORY_CARE_PROVIDER_SITE_OTHER): Payer: BC Managed Care – PPO

## 2020-06-06 ENCOUNTER — Ambulatory Visit (INDEPENDENT_AMBULATORY_CARE_PROVIDER_SITE_OTHER): Payer: BC Managed Care – PPO | Admitting: Osteopathic Medicine

## 2020-06-06 ENCOUNTER — Encounter: Payer: Self-pay | Admitting: Osteopathic Medicine

## 2020-06-06 VITALS — BP 112/80 | HR 68 | Temp 98.0°F | Wt 126.0 lb

## 2020-06-06 DIAGNOSIS — G43709 Chronic migraine without aura, not intractable, without status migrainosus: Secondary | ICD-10-CM | POA: Diagnosis not present

## 2020-06-06 DIAGNOSIS — G8929 Other chronic pain: Secondary | ICD-10-CM

## 2020-06-06 DIAGNOSIS — IMO0002 Reserved for concepts with insufficient information to code with codable children: Secondary | ICD-10-CM

## 2020-06-06 DIAGNOSIS — M25511 Pain in right shoulder: Secondary | ICD-10-CM

## 2020-06-06 MED ORDER — PREDNISONE 20 MG PO TABS: 20.0000 mg | ORAL_TABLET | Freq: Two times a day (BID) | ORAL | 0 refills | Status: DC

## 2020-06-06 MED ORDER — NAPROXEN 375 MG PO TABS: 375.0000 mg | ORAL_TABLET | Freq: Two times a day (BID) | ORAL | 0 refills | Status: DC

## 2020-06-06 NOTE — Patient Instructions (Signed)
Plan:  Shoulder  Antiinflammatories and steroids Xray today  See printed instructions for home exercises If no better, see Dr T (sports med) here   FMLA: Will resubmit In the future, this paperwork goes to neurology!

## 2020-06-06 NOTE — Progress Notes (Signed)
Ashley Beard is a 54 y.o. female who presents to  [place] at [facility]  today, 06/06/20, seeking care for the following:  . R shoulder pain ongoing few months, feels like tightness/sharp pain in bicep area, hurts to move shoudler. Exam nonspecific, pain in all planes of motion, (+)apprehension. No injury. No neck pain.  . Needs FMLA fileld out for headaches - see media - we sent in 12/2019 and this doesn't expire, pt was given this copy and asked to give it to her employer to see if this is what's needed      [place] with other pertinent findings:  The primary encounter diagnosis was Chronic right shoulder pain. A diagnosis of Chronic migraine was also pertinent to this visit.     Patient Instructions  Plan:  Shoulder  Antiinflammatories and steroids Xray today  See printed instructions for home exercises If no better, see Dr T (sports med) here   FMLA: Will resubmit In the future, this paperwork goes to neurology!      Orders Placed This Encounter  Procedures  . DG Shoulder Right    Meds ordered this encounter  Medications  . predniSONE (DELTASONE) 20 MG tablet    Sig: Take 1 tablet (20 mg total) by mouth 2 (two) times daily with a meal.    Dispense:  10 tablet    Refill:  0  . naproxen (NAPROSYN) 375 MG tablet    Sig: Take 1 tablet (375 mg total) by mouth 2 (two) times daily with a meal.    Dispense:  30 tablet    Refill:  0       Follow-up instructions: Return for RECHECK PENDING RESULTS / IF WORSE OR CHANGE.                                         BP 112/80 (BP Location: Left Arm, Patient Position: Sitting)   Pulse 68   Temp 98 F (36.7 C)   Wt 126 lb (57.2 kg)   LMP 05/07/2018   SpO2 98%   BMI 25.45 kg/m   Current Meds  Medication Sig  . amitriptyline (ELAVIL) 25 MG tablet Take 2 tablets (50 mg total) by mouth at bedtime. (Patient taking differently:  Take 25 mg by mouth at bedtime. )  . aspirin 325 MG tablet Take 1 tablet (325 mg total) by mouth daily.  Marland Kitchen atorvastatin (LIPITOR) 40 MG tablet Take 1 tablet (40 mg total) by mouth daily.  . baclofen (LIORESAL) 10 MG tablet Take 1 tablet (10 mg total) by mouth 3 (three) times daily as needed for muscle spasms (headache, neck pain).  Marland Kitchen divalproex (DEPAKOTE ER) 500 MG 24 hr tablet Take 1 tablet (500 mg total) by mouth daily.  . hydrochlorothiazide (HYDRODIURIL) 12.5 MG tablet Take 1 tablet (12.5 mg total) by mouth daily.  . Multiple Vitamin (MULTIVITAMIN) capsule Take 1 capsule by mouth daily.  . Omega-3 Fatty Acids (FISH OIL) 500 MG CAPS Take 1 capsule by mouth daily.  Marland Kitchen PARoxetine (PAXIL) 20 MG tablet Take 20 mg by mouth daily.   . potassium chloride (KLOR-CON) 10 MEQ tablet Take 1 tablet (10 mEq total) by mouth daily.  Marland Kitchen topiramate (TOPAMAX) 100 MG tablet Take 1 tablet (100 mg total) by mouth at bedtime.  Marland Kitchen UBRELVY 50 MG TABS     No results found  for this or any previous visit (from the past 56 hour(s)).  No results found.     All questions at time of visit were answered - patient instructed to contact office with any additional concerns or updates.  ER/RTC precautions were reviewed with the patient as applicable.   Please note: voice recognition software was used to produce this document, and typos may escape review. Please contact Dr. Sheppard Coil for any needed clarifications.

## 2020-06-14 ENCOUNTER — Emergency Department (HOSPITAL_COMMUNITY)
Admission: EM | Admit: 2020-06-14 | Discharge: 2020-06-14 | Disposition: A | Discharge status: Home / Self Care | Payer: BC Managed Care – PPO | Attending: Emergency Medicine | Admitting: Emergency Medicine

## 2020-06-14 DIAGNOSIS — Z5321 Procedure and treatment not carried out due to patient leaving prior to being seen by health care provider: Secondary | ICD-10-CM | POA: Insufficient documentation

## 2020-06-14 DIAGNOSIS — R200 Anesthesia of skin: Secondary | ICD-10-CM | POA: Diagnosis not present

## 2020-06-14 NOTE — ED Triage Notes (Signed)
Pt decided to leave since she has a mri scheduled out pt

## 2020-06-19 ENCOUNTER — Encounter: Payer: Self-pay | Admitting: Osteopathic Medicine

## 2020-06-20 DIAGNOSIS — F419 Anxiety disorder, unspecified: Secondary | ICD-10-CM | POA: Diagnosis not present

## 2020-06-20 DIAGNOSIS — Z8673 Personal history of transient ischemic attack (TIA), and cerebral infarction without residual deficits: Secondary | ICD-10-CM | POA: Diagnosis not present

## 2020-06-20 DIAGNOSIS — G43009 Migraine without aura, not intractable, without status migrainosus: Secondary | ICD-10-CM | POA: Diagnosis not present

## 2020-06-20 DIAGNOSIS — D329 Benign neoplasm of meninges, unspecified: Secondary | ICD-10-CM | POA: Diagnosis not present

## 2020-06-20 DIAGNOSIS — F329 Major depressive disorder, single episode, unspecified: Secondary | ICD-10-CM | POA: Diagnosis not present

## 2020-06-25 ENCOUNTER — Ambulatory Visit: Payer: BC Managed Care – PPO | Admitting: Nurse Practitioner

## 2020-06-28 ENCOUNTER — Ambulatory Visit (INDEPENDENT_AMBULATORY_CARE_PROVIDER_SITE_OTHER): Payer: BC Managed Care – PPO | Admitting: Sports Medicine

## 2020-06-28 DIAGNOSIS — M25511 Pain in right shoulder: Secondary | ICD-10-CM | POA: Diagnosis not present

## 2020-06-28 MED ORDER — DICLOFENAC SODIUM 75 MG PO TBEC: 75.0000 mg | DELAYED_RELEASE_TABLET | Freq: Two times a day (BID) | ORAL | 3 refills | Status: AC

## 2020-06-28 NOTE — Assessment & Plan Note (Signed)
This is a very pleasant 54 year old female, she has had right shoulder pain for some time now, she localizes it over the deltoid and it keeps her up at night, worse with overhead activities. She also has paresthesias coming down the back of her right arm to her third and fourth fingers. This represents a grade enigma of sports medicine, pain coming from the shoulder are coming from the neck, on further questioning it sounds like her pain started in the shoulder and she later developed radicular symptoms likely related to abnormal scapular biomechanics and resultant cervical paraspinal spasm. She is having some difficulty sleeping at night, Dr. Leonie Man has placed her on amitriptyline which seems to be helping significantly. We are going to initiate treatment for shoulder bursitis/impingement syndrome, Voltaren 75 twice daily, formal physical therapy for 6 weeks, return to see me afterwards, we can consider a subacromial injection if no better.

## 2020-06-28 NOTE — Addendum Note (Signed)
Addended by: Silverio Decamp on: 06/28/2020 02:39 PM   Modules accepted: Orders

## 2020-06-28 NOTE — Progress Notes (Signed)
° ° °  Procedures performed today:    None.  Independent interpretation of notes and tests performed by another provider:   None.  Brief History, Exam, Impression, and Recommendations:    Right shoulder pain This is a very pleasant 54 year old female, she has had right shoulder pain for some time now, she localizes it over the deltoid and it keeps her up at night, worse with overhead activities. She also has paresthesias coming down the back of her right arm to her third and fourth fingers. This represents a grade enigma of sports medicine, pain coming from the shoulder are coming from the neck, on further questioning it sounds like her pain started in the shoulder and she later developed radicular symptoms likely related to abnormal scapular biomechanics and resultant cervical paraspinal spasm. She is having some difficulty sleeping at night, Dr. Leonie Man has placed her on amitriptyline which seems to be helping significantly. We are going to initiate treatment for shoulder bursitis/impingement syndrome, Voltaren 75 twice daily, formal physical therapy for 6 weeks, return to see me afterwards, we can consider a subacromial injection if no better.    ___________________________________________ Ashley Beard, M.D., ABFM., CAQSM. Primary Care and [place] Instructor of [place] [facility] of Medicine

## 2020-07-09 ENCOUNTER — Ambulatory Visit: Payer: BC Managed Care – PPO | Admitting: Sports Medicine

## 2020-07-25 ENCOUNTER — Other Ambulatory Visit: Payer: Self-pay | Admitting: Physician Assistant

## 2020-07-25 DIAGNOSIS — G4489 Other headache syndrome: Secondary | ICD-10-CM

## 2020-07-26 ENCOUNTER — Encounter: Payer: Self-pay | Admitting: Rehabilitative and Restorative Service Providers"

## 2020-07-26 ENCOUNTER — Telehealth: Payer: Self-pay | Admitting: Osteopathic Medicine

## 2020-07-26 ENCOUNTER — Other Ambulatory Visit: Payer: Self-pay

## 2020-07-26 ENCOUNTER — Ambulatory Visit (INDEPENDENT_AMBULATORY_CARE_PROVIDER_SITE_OTHER): Payer: BC Managed Care – PPO | Admitting: Rehabilitative and Restorative Service Providers"

## 2020-07-26 DIAGNOSIS — R29898 Other symptoms and signs involving the musculoskeletal system: Secondary | ICD-10-CM

## 2020-07-26 DIAGNOSIS — R293 Abnormal posture: Secondary | ICD-10-CM

## 2020-07-26 DIAGNOSIS — G2589 Other specified extrapyramidal and movement disorders: Secondary | ICD-10-CM | POA: Diagnosis not present

## 2020-07-26 DIAGNOSIS — M25511 Pain in right shoulder: Secondary | ICD-10-CM | POA: Diagnosis not present

## 2020-07-26 NOTE — Therapy (Addendum)
[place], [place], [address] Phone: [phone]   Fax:  [phone]  Physical Therapy Evaluation  Patient Details  Name: Ashley Beard MRN: [id number] Date of Birth: 1966-09-09 Referring Provider (PT): Dr Dianah Field    Encounter Date: 07/26/2020   PT End of Session - 07/26/20 1410    Visit Number 1    Number of Visits 12    Date for PT Re-Evaluation 09/06/20    PT Start Time 2774    PT Stop Time 1416    PT Time Calculation (min) 58 min    Activity Tolerance Patient tolerated treatment well           Past Medical History:  Diagnosis Date  . Abnormal Pap smear of cervix    age 54  . Anxiety   . Carotid arterial disease ([place])   . Headache   . Meningioma, cerebral ([place]) 08/18/2018   5 x 12 mm meningioma left middle cranial fossa similar in size to the prior MRI of 02/25/2017  . Refusal of blood transfusions as patient is Jehovah's Witness   . Stroke ([place])   . Trigeminal neuralgia 06/22/2017   Dx by neuro. Takes Tegretol prn    Past Surgical History:  Procedure Laterality Date  . ENDARTERECTOMY Left 02/24/2017   Procedure: ENDARTERECTOMY CAROTID with removal of thrombus ;  Surgeon: Rosetta Posner, MD;  Location: [facility];  Service: Vascular;  Laterality: Left;  . GYNECOLOGIC CRYOSURGERY     age 54  . IR GENERIC HISTORICAL  02/25/2017   IR ANGIO VERTEBRAL SEL SUBCLAVIAN INNOMINATE UNI R MOD SED 02/25/2017 [name], MD [facility]INTER[facility]  . IR GENERIC HISTORICAL  02/25/2017   IR ANGIO INTRA EXTRACRAN SEL COM CAROTID INNOMINATE BILAT MOD SED 02/25/2017 [name], MD [facility]  . OOPHORECTOMY    . TEE WITHOUT CARDIOVERSION N/A 05/04/2017   Procedure: TRANSESOPHAGEAL ECHOCARDIOGRAM (TEE);  Surgeon: Lelon Perla, MD;  Location: [facility] ENDOSCOPY;  Service: Cardiovascular;  Laterality: N/A;    There were no vitals filed for this visit.    Subjective Assessment - 07/26/20 1323     Subjective Patient reports gradual onset of Rt shoulder pain over the past 3-4 months with no known injury. She has pain with reaching overhead or lifting. She has been having cramps in her body for the past week.    Pertinent History CVA 2017; HTN; COVID    Patient Stated Goals get rid of the shoulder pain    Currently in Pain? Yes    Pain Score 3    6/10 at night   Pain Location Shoulder    Pain Orientation Right    Pain Descriptors / Indicators Burning    Pain Type Acute pain    Pain Onset More than a month ago    Pain Frequency Constant    Aggravating Factors  worse when lying down to sleep; typing at work; reaching overhead    Pain Relieving Factors moving hand; rubbing arm              [facility] PT Assessment - 07/26/20 0001      Assessment   Medical Diagnosis Rt shoulder dysfunction     Referring Provider (PT) Dr Dianah Field     Onset Date/Surgical Date 03/07/20    Hand Dominance Right    Next MD Visit PRN    Prior Therapy after CVA       Precautions   Precautions None  Restrictions   Weight Bearing Restrictions No      Balance Screen   Has the patient fallen in the past 6 months No    Has the patient had a decrease in activity level because of a fear of falling?  No    Is the patient reluctant to leave their home because of a fear of falling?  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     Leisure household chores; reading; Bible and church; walking dog ~ 30 min a day x 3 times/day       Observation/Other Assessments   Focus on Therapeutic Outcomes (FOTO)  47% limitation       Sensation   Additional Comments intermittent tingling tips of both hands       Posture/Postural Control   Posture Comments head forward shoulders rounded and elevated; head of the  humerus anterior in orientation      AROM   Right Shoulder Extension 50 Degrees    Right Shoulder Flexion 138 Degrees   pain in shoulder   Right Shoulder ABduction 137 Degrees   pain in shoulder   Right Shoulder Internal Rotation --   thumb to T12 pain in shoulder   Right Shoulder External Rotation 85 Degrees   pain in shoulder    Left Shoulder Extension 47 Degrees    Left Shoulder Flexion 159 Degrees    Left Shoulder ABduction 163 Degrees    Left Shoulder Internal Rotation --   thumb to T7   Left Shoulder External Rotation 90 Degrees    Cervical Flexion 63    Cervical Extension 55    Cervical - Right Side Bend 37    Cervical - Left Side Bend 26   pain Rt upper trap to shoulder    Cervical - Right Rotation 67    Cervical - Left Rotation 72      Strength   Overall Strength Comments WFL's patterns of substitution Rt shoulder girdle with resisted middle and lower trap Rt       Palpation   Spinal mobility hypomobile mid thoracic spine into cervical spine with CPA and Rt lateral mobs     Palpation comment significatn muscular tightness through the ant/lat/post cervical musculature; pecs; upper trap; leveator; biceps; deltoid Rt                       Objective measurements completed on examination: See above findings.       [place] Adult PT Treatment/Exercise - 07/26/20 0001      Neuro Re-ed    Neuro Re-ed Details  postural correction      Shoulder Exercises: Standing   Other Standing Exercises axial extension 10 sec x 5; scap squeeze 10 sec x 5; L's x 10; W's x 10 with noodle       Shoulder Exercises: Pulleys   Flexion --   5 sec hold x 10      Shoulder Exercises: Stretch   Other Shoulder Stretches biceps stretch at door 30 sec x 2       Moist Heat Therapy   Number Minutes Moist Heat 15 Minutes    Moist Heat Location Shoulder   Rt     Electrical Stimulation  Electrical Stimulation Location Rt shoulder     Electrical Stimulation Action TENS    Electrical  Stimulation Parameters to tolerance    Electrical Stimulation Goals Pain;Tone                  PT Education - 07/26/20 1410    Education Details HEP; POC TENS    Person(s) Educated Patient    Methods Explanation;Demonstration;Tactile cues;Verbal cues;Handout    Comprehension Verbalized understanding;Returned demonstration;Verbal cues required;Tactile cues required               PT Long Term Goals - 07/26/20 1431      PT LONG TERM GOAL #1   Title Improve posture and alignment with patient to demonstrate improved upright posture with posterior shoulder girdle engaged    Time 6    Period Weeks    Status New    Target Date 09/06/20      PT LONG TERM GOAL #2   Title Increase AROM Rt shoulder to within 5-7 degrees of AROM Lt shoulder with no pain    Time 6    Period Weeks    Status New    Target Date 09/06/20      PT LONG TERM GOAL #3   Title Patient reports return to functional use of Rt UE with minimal to no pain Rt shoulder    Time 6    Period Weeks    Status New    Target Date 09/06/20      PT LONG TERM GOAL #4   Title Independent in HEP    Time 6    Period Weeks    Status New    Target Date 09/06/20      PT LONG TERM GOAL #5   Title Improve FOTO to </= 34% limitation    Time 6    Period Weeks    Status New    Target Date 09/06/20                  Plan - 07/26/20 1418    Clinical Impression Statement Patient presents with acute Rt shoulder pain for the past 3-4 months with no known injury. She has a history of CVA with Rt hemiplegia 2018; COVID 2020. Patient has poor posture and alignment; limited cervical and Rt shoulder ROM; Rt scapular dyskinesis; muscular tightness with palpation Rt shoulder girdle; pain with use of Rt UE especially elevation and reaching behind back. Patient has early signs of adhesive capsulitis with capsular pattern joint restrictions. Patient will benefit from PT to address problems identified.    Stability/Clinical  Decision Making Stable/Uncomplicated    Clinical Decision Making Low    Rehab Potential Good    PT Frequency 2x / week    PT Duration 6 weeks    PT Treatment/Interventions Patient/family education;ADLs/Self Care Home Management;Aquatic Therapy;Cryotherapy;Electrical Stimulation;Iontophoresis 4mg /ml Dexamethasone;Moist Heat;Ultrasound;Functional mobility training;Therapeutic activities;Therapeutic exercise;Neuromuscular re-education;Manual techniques;Dry needling;Taping    PT Next Visit Plan review HEP; work on scap activiation; work to identify best pec stretch that is tolerated; add manual work Rt upper quarter; postural correction; modalities as indicated; consider DN which was not discussed today; needs to work on scapular control Rt posterior shoudler girdle    PT Home Exercise Plan [email or web address]    Consulted and Agree with Plan of Care Patient           Patient will benefit from skilled therapeutic intervention in order to improve the following deficits and impairments:     Visit Diagnosis: Acute  pain of right shoulder - Plan: PT plan of care cert/re-cert  Scapular dyskinesis - Plan: PT plan of care cert/re-cert  Other symptoms and signs involving the musculoskeletal system - Plan: PT plan of care cert/re-cert  Abnormal posture - Plan: PT plan of care cert/re-cert     Problem List Patient Active Problem List   Diagnosis Date Noted  . Right shoulder pain 06/28/2020  . Hypotension 12/19/2019  . Encounter for completion of form with patient 12/19/2019  . Caregiver stress syndrome 02/13/2019  . Chronic migraine 09/15/2018  . Hypokalemia due to loss of potassium 08/29/2018  . Meningioma, cerebral ([place]) 08/18/2018  . Encounter for medication management 06/25/2018  . Other headache syndrome 06/13/2018  . Abnormal mammogram of left breast 10/06/2017  . Trigeminal neuralgia 06/22/2017  . Right homonymous hemianopsia 03/16/2017  . History of cerebrovascular accident (CVA) from left  carotid artery occlusion involving left middle cerebral artery territory 02/26/2017  . Hypertension goal BP (blood pressure) < 130/80 02/22/2017  . Carotid artery disease ([place]) 02/22/2017    Deanthony Maull Nilda Simmer PT, MPH  07/26/2020, 2:37 PM  [facility] [place], [place], [address] Phone: [phone]   Fax:  [phone]  Name: Ashley Beard MRN: [id number] Date of Birth: 1966-07-07 PHYSICAL THERAPY DISCHARGE SUMMARY  Visits from Start of Care: Evaluation only   Current functional level related to goals / functional outcomes: See evaluation for discharge status    Remaining deficits: Unknown    Education / Equipment: Initial HEP  Plan: Patient agrees to discharge.  Patient goals were not met. Patient is being discharged due to not returning since the last visit.  ?????     Joliene Salvador P. Helene Kelp PT, MPH 09/13/20 1:27 PM

## 2020-07-26 NOTE — Telephone Encounter (Signed)
Left a detailed vm msg for pt regarding provider's note. Aware that clarification of which chronic issue for the FMLA form is required. Direct call back info provided.

## 2020-07-26 NOTE — Telephone Encounter (Signed)
Please call patient: I received FMLA forms, on review of her chart it looks like she has recently seen Dr. Darene Lamer for shoulder issues, is also following with neurologist for migraines.  I need to know what medical condition she is seeking FMLA for -I might not be the person to fill out this paperwork.  Thanks!

## 2020-07-26 NOTE — Patient Instructions (Signed)
Access Code: [email or web address]: [email or web address]: 08/20/2021Prepared by: Karstyn Birkey HoltExercises  Seated Cervical Retraction - 2 x daily - 7 x weekly - 1-2 sets - 5-10 reps - 10 sec hold  Seated Scapular Retraction - 2 x daily - 7 x weekly - 1-2 sets - 10 reps - 10 sec hold  Shoulder External Rotation in 45 Degrees Abduction - 2 x daily - 7 x weekly - 1-2 sets - 10 reps - 3 sec hold  Shoulder External Rotation and Scapular Retraction - 2 x daily - 7 x weekly - 1 sets - 3 reps - 30 sec hold  Bicep Stretch at Table - 2 x daily - 7 x weekly - 1 sets - 3 reps - 30 sec hold  Seated Shoulder Flexion AAROM with Pulley Behind - 2 x daily - 7 x weekly - 1 sets - 10 reps - 10 sec hold Patient Education  TENS Unit

## 2020-07-26 NOTE — Telephone Encounter (Signed)
Refill request submitted via MyChart. Routing to assistant to address.  

## 2020-07-27 ENCOUNTER — Encounter: Payer: Self-pay | Admitting: Osteopathic Medicine

## 2020-07-31 ENCOUNTER — Encounter: Payer: BC Managed Care – PPO | Admitting: Rehabilitative and Restorative Service Providers"

## 2020-08-06 DIAGNOSIS — Z8673 Personal history of transient ischemic attack (TIA), and cerebral infarction without residual deficits: Secondary | ICD-10-CM | POA: Diagnosis not present

## 2020-08-06 DIAGNOSIS — R413 Other amnesia: Secondary | ICD-10-CM | POA: Diagnosis not present

## 2020-08-06 DIAGNOSIS — M25511 Pain in right shoulder: Secondary | ICD-10-CM | POA: Diagnosis not present

## 2020-08-06 DIAGNOSIS — G43709 Chronic migraine without aura, not intractable, without status migrainosus: Secondary | ICD-10-CM | POA: Diagnosis not present

## 2020-08-09 ENCOUNTER — Encounter: Payer: BC Managed Care – PPO | Admitting: Rehabilitative and Restorative Service Providers"

## 2020-08-16 DIAGNOSIS — M7551 Bursitis of right shoulder: Secondary | ICD-10-CM | POA: Diagnosis not present

## 2020-10-03 DIAGNOSIS — F419 Anxiety disorder, unspecified: Secondary | ICD-10-CM | POA: Diagnosis not present

## 2020-10-03 DIAGNOSIS — Z8673 Personal history of transient ischemic attack (TIA), and cerebral infarction without residual deficits: Secondary | ICD-10-CM | POA: Diagnosis not present

## 2020-10-03 DIAGNOSIS — F32A Depression, unspecified: Secondary | ICD-10-CM | POA: Diagnosis not present

## 2020-10-03 DIAGNOSIS — G43009 Migraine without aura, not intractable, without status migrainosus: Secondary | ICD-10-CM | POA: Diagnosis not present

## 2020-12-24 ENCOUNTER — Encounter: Payer: Self-pay | Admitting: Osteopathic Medicine

## 2020-12-30 ENCOUNTER — Encounter: Payer: Self-pay | Admitting: Osteopathic Medicine

## 2021-01-01 DIAGNOSIS — Z03818 Encounter for observation for suspected exposure to other biological agents ruled out: Secondary | ICD-10-CM | POA: Diagnosis not present

## 2021-01-01 DIAGNOSIS — Z20822 Contact with and (suspected) exposure to covid-19: Secondary | ICD-10-CM | POA: Diagnosis not present

## 2021-01-07 DIAGNOSIS — G43009 Migraine without aura, not intractable, without status migrainosus: Secondary | ICD-10-CM | POA: Diagnosis not present

## 2021-01-07 DIAGNOSIS — F419 Anxiety disorder, unspecified: Secondary | ICD-10-CM | POA: Diagnosis not present

## 2021-01-07 DIAGNOSIS — F32A Depression, unspecified: Secondary | ICD-10-CM | POA: Diagnosis not present

## 2021-01-07 DIAGNOSIS — Z8673 Personal history of transient ischemic attack (TIA), and cerebral infarction without residual deficits: Secondary | ICD-10-CM | POA: Diagnosis not present

## 2021-02-05 DIAGNOSIS — Z8673 Personal history of transient ischemic attack (TIA), and cerebral infarction without residual deficits: Secondary | ICD-10-CM | POA: Diagnosis not present

## 2021-02-05 DIAGNOSIS — G43009 Migraine without aura, not intractable, without status migrainosus: Secondary | ICD-10-CM | POA: Diagnosis not present

## 2021-02-05 DIAGNOSIS — R519 Headache, unspecified: Secondary | ICD-10-CM | POA: Diagnosis not present

## 2021-02-06 DIAGNOSIS — D329 Benign neoplasm of meninges, unspecified: Secondary | ICD-10-CM | POA: Diagnosis not present

## 2021-02-06 DIAGNOSIS — G43009 Migraine without aura, not intractable, without status migrainosus: Secondary | ICD-10-CM | POA: Diagnosis not present

## 2021-02-06 DIAGNOSIS — Z8673 Personal history of transient ischemic attack (TIA), and cerebral infarction without residual deficits: Secondary | ICD-10-CM | POA: Diagnosis not present

## 2021-02-12 DIAGNOSIS — R519 Headache, unspecified: Secondary | ICD-10-CM | POA: Diagnosis not present

## 2021-02-12 DIAGNOSIS — G9389 Other specified disorders of brain: Secondary | ICD-10-CM | POA: Diagnosis not present

## 2021-05-08 DIAGNOSIS — G43009 Migraine without aura, not intractable, without status migrainosus: Secondary | ICD-10-CM | POA: Diagnosis not present

## 2021-05-21 DIAGNOSIS — Z03818 Encounter for observation for suspected exposure to other biological agents ruled out: Secondary | ICD-10-CM | POA: Diagnosis not present

## 2021-05-21 DIAGNOSIS — Z20822 Contact with and (suspected) exposure to covid-19: Secondary | ICD-10-CM | POA: Diagnosis not present

## 2021-08-06 ENCOUNTER — Encounter: Payer: Self-pay | Admitting: Family Medicine

## 2021-08-21 DIAGNOSIS — Z8673 Personal history of transient ischemic attack (TIA), and cerebral infarction without residual deficits: Secondary | ICD-10-CM | POA: Diagnosis not present

## 2021-08-21 DIAGNOSIS — D329 Benign neoplasm of meninges, unspecified: Secondary | ICD-10-CM | POA: Diagnosis not present

## 2021-08-21 DIAGNOSIS — R202 Paresthesia of skin: Secondary | ICD-10-CM | POA: Diagnosis not present

## 2021-11-11 DIAGNOSIS — G43009 Migraine without aura, not intractable, without status migrainosus: Secondary | ICD-10-CM | POA: Diagnosis not present

## 2021-11-11 DIAGNOSIS — Z8673 Personal history of transient ischemic attack (TIA), and cerebral infarction without residual deficits: Secondary | ICD-10-CM | POA: Diagnosis not present

## 2021-12-09 DIAGNOSIS — Z8673 Personal history of transient ischemic attack (TIA), and cerebral infarction without residual deficits: Secondary | ICD-10-CM | POA: Diagnosis not present

## 2022-02-04 DIAGNOSIS — D329 Benign neoplasm of meninges, unspecified: Secondary | ICD-10-CM | POA: Diagnosis not present

## 2022-02-04 DIAGNOSIS — Z8673 Personal history of transient ischemic attack (TIA), and cerebral infarction without residual deficits: Secondary | ICD-10-CM | POA: Diagnosis not present

## 2022-09-29 DIAGNOSIS — Z8673 Personal history of transient ischemic attack (TIA), and cerebral infarction without residual deficits: Secondary | ICD-10-CM | POA: Diagnosis not present

## 2022-09-29 DIAGNOSIS — G43009 Migraine without aura, not intractable, without status migrainosus: Secondary | ICD-10-CM | POA: Diagnosis not present

## 2022-10-20 ENCOUNTER — Encounter: Payer: Self-pay | Admitting: Family Medicine

## 2022-10-20 ENCOUNTER — Ambulatory Visit: Payer: BC Managed Care – PPO | Admitting: Family Medicine

## 2022-10-20 VITALS — BP 171/73 | HR 73 | Ht 59.0 in | Wt 149.0 lb

## 2022-10-20 DIAGNOSIS — I10 Essential (primary) hypertension: Secondary | ICD-10-CM

## 2022-10-20 MED ORDER — HYDROCHLOROTHIAZIDE 12.5 MG PO TABS: 12.5000 mg | ORAL_TABLET | Freq: Every day | ORAL | 3 refills | Status: DC

## 2022-10-20 NOTE — Progress Notes (Signed)
New Patient Office Visit  Subjective    Patient ID: Ashley Beard, female    DOB: May 19, 1966  Age: 57 y.o. MRN: [id number]  CC:  Chief Complaint  Patient presents with   Hypertension    HPI Ashley Beard presents to establish care. She is here to address her blood pressure today. She has been told by other specialists on several occasions that her blood pressure has been elevated. BP today is 185/86. She denies headaches, chest pain, blurry vision.   She had a stroke in 2018 and was diagnosed with HTN and started on medication. From Epic chart review, pt was taken off BP medication due to hypotension.    Outpatient Encounter Medications as of 10/20/2022  Medication Sig   amitriptyline (ELAVIL) 25 MG tablet TAKE 2 TABLETS BY MOUTH AT BEDTIME   aspirin 325 MG tablet Take 1 tablet (325 mg total) by mouth daily.   atorvastatin (LIPITOR) 40 MG tablet Take 1 tablet (40 mg total) by mouth daily.   divalproex (DEPAKOTE ER) 500 MG 24 hr tablet Take 1 tablet (500 mg total) by mouth daily.   hydrochlorothiazide (HYDRODIURIL) 12.5 MG tablet Take 1 tablet (12.5 mg total) by mouth daily.   Multiple Vitamin (MULTIVITAMIN) capsule Take 1 capsule by mouth daily.   Omega-3 Fatty Acids (FISH OIL) 500 MG CAPS Take 1 capsule by mouth daily.   PARoxetine (PAXIL) 20 MG tablet Take 20 mg by mouth daily.    potassium chloride (KLOR-CON) 10 MEQ tablet Take 1 tablet (10 mEq total) by mouth daily.   topiramate (TOPAMAX) 100 MG tablet Take 1 tablet (100 mg total) by mouth at bedtime.   UBRELVY 50 MG TABS    [DISCONTINUED] hydrochlorothiazide (HYDRODIURIL) 12.5 MG tablet Take 1 tablet (12.5 mg total) by mouth daily.   No facility-administered encounter medications on file as of 10/20/2022.    Past Medical History:  Diagnosis Date   Abnormal Pap smear of cervix    age 61   Anxiety    Carotid arterial disease ([place])    Headache    Meningioma, cerebral ([place]) 08/18/2018   5 x 12 mm meningioma left middle  cranial fossa similar in size to the prior MRI of 02/25/2017   Refusal of blood transfusions as patient is Jehovah's Witness    Stroke [facility])    Trigeminal neuralgia 06/22/2017   Dx by neuro. Takes Tegretol prn    Past Surgical History:  Procedure Laterality Date   ENDARTERECTOMY Left 02/24/2017   Procedure: ENDARTERECTOMY CAROTID with removal of thrombus ;  Surgeon: Ashley Posner, MD;  Location: [facility];  Service: Vascular;  Laterality: Left;   GYNECOLOGIC CRYOSURGERY     age 89   IR GENERIC HISTORICAL  02/25/2017   IR ANGIO VERTEBRAL SEL SUBCLAVIAN INNOMINATE UNI R MOD SED 02/25/2017 Ashley Bras, MD [facility]   IR GENERIC HISTORICAL  02/25/2017   IR ANGIO INTRA EXTRACRAN SEL COM CAROTID INNOMINATE BILAT MOD SED 02/25/2017 Ashley Bras, MD [facility]   OOPHORECTOMY     TEE WITHOUT CARDIOVERSION N/A 05/04/2017   Procedure: TRANSESOPHAGEAL ECHOCARDIOGRAM (TEE);  Surgeon: Ashley Perla, MD;  Location: [facility] ENDOSCOPY;  Service: Cardiovascular;  Laterality: N/A;    Family History  Problem Relation Age of Onset   Diabetes Maternal Grandmother        Died of heart attack   Hypertension Maternal Grandmother    Colon cancer Maternal Grandmother    Stroke Paternal Grandfather    Esophageal cancer Neg  Hx     Social History   Socioeconomic History   Marital status: Divorced    Spouse name: Not on file   Number of children: 3   Years of education: Not on file   Highest education level: Not on file  Occupational History   Not on file  Tobacco Use   Smoking status: Former    Types: Cigarettes    Quit date: 09/2016    Years since quitting: 6.1   Smokeless tobacco: Never   Tobacco comments:    very rare, social  Vaping Use   Vaping Use: Never used  Substance and Sexual Activity   Alcohol use: Yes    Alcohol/week: 2.0 standard drinks of alcohol    Types: 2 Cans of beer per week    Comment: social   Drug use: No   Sexual activity: Yes    Birth control/protection:  Condom  Other Topics Concern   Not on file  Social History Narrative   Not on file   Social Determinants of Health   Financial Resource Strain: Not on file  Food Insecurity: Not on file  Transportation Needs: Not on file  Physical Activity: Not on file  Stress: Not on file  Social Connections: Not on file  Intimate Partner Violence: Not on file    Review of Systems  Constitutional:  Negative for chills and fever.  Respiratory:  Negative for cough and shortness of breath.   Cardiovascular:  Negative for chest pain.  Neurological:  Negative for headaches.        Objective    BP (!) 185/86   Pulse 74   Ht '4\' 11"'$  (1.499 m)   Wt 149 lb (67.6 kg)   LMP 05/07/2018   SpO2 100%   BMI 30.09 kg/m   Physical Exam Vitals and nursing note reviewed.  Constitutional:      General: She is not in acute distress.    Appearance: Normal appearance.  HENT:     Head: Normocephalic and atraumatic.     Right Ear: External ear normal.     Left Ear: External ear normal.     Nose: Nose normal.  Eyes:     Conjunctiva/sclera: Conjunctivae normal.  Cardiovascular:     Rate and Rhythm: Normal rate and regular rhythm.  Pulmonary:     Effort: Pulmonary effort is normal.     Breath sounds: Normal breath sounds.  Neurological:     General: No focal deficit present.     Mental Status: She is alert and oriented to person, place, and time.  Psychiatric:        Mood and Affect: Mood normal.        Behavior: Behavior normal.        Thought Content: Thought content normal.        Judgment: Judgment normal.       Assessment & Plan:   Problem List Items Addressed This Visit       Cardiovascular and Mediastinum   Primary hypertension - Primary    - BP elevated to 185/86 - will go ahead and start hctz 12.'5mg'$   - repeat 171/73 - will order CBC, CMP, Lipid panel  - have also ordered microalbumin/cr ratio to assess kidney function - follow up in 2 weeks for re-eval to HTN. If  uncontrolled will add lisinopril       Relevant Medications   hydrochlorothiazide (HYDRODIURIL) 12.5 MG tablet   Other Relevant Orders   CBC   COMPLETE METABOLIC PANEL  WITH GFR   Microalbumin / creatinine urine ratio   Lipid panel    Return in about 2 weeks (around 11/03/2022).   Owens Loffler, DO

## 2022-10-20 NOTE — Assessment & Plan Note (Signed)
-   BP elevated to 185/86 - will go ahead and start hctz 12.'5mg'$   - repeat 171/73 - will order CBC, CMP, Lipid panel  - have also ordered microalbumin/cr ratio to assess kidney function - follow up in 2 weeks for re-eval to HTN. If uncontrolled will add lisinopril

## 2022-10-21 LAB — MICROALBUMIN / CREATININE URINE RATIO
Creatinine, Urine: 90 mg/dL (ref 20–275)
Microalb Creat Ratio: 3 mcg/mg creat (ref <30)
Microalb, Ur: 0.3 mg/dL

## 2022-10-21 LAB — COMPLETE METABOLIC PANEL WITH GFR
AG Ratio: 1.4 (calc) (ref 1.0–2.5)
ALT: 30 U/L — ABNORMAL HIGH (ref 6–29)
AST: 25 U/L (ref 10–35)
Albumin: 4.1 g/dL (ref 3.6–5.1)
Alkaline phosphatase (APISO): 89 U/L (ref 37–153)
BUN: 15 mg/dL (ref 7–25)
CO2: 30 mmol/L (ref 20–32)
Calcium: 8.9 mg/dL (ref 8.6–10.4)
Chloride: 105 mmol/L (ref 98–110)
Creat: 0.86 mg/dL (ref 0.50–1.03)
Globulin: 3 g/dL (calc) (ref 1.9–3.7)
Glucose, Bld: 80 mg/dL (ref 65–99)
Potassium: 3.9 mmol/L (ref 3.5–5.3)
Sodium: 141 mmol/L (ref 135–146)
Total Bilirubin: 0.3 mg/dL (ref 0.2–1.2)
Total Protein: 7.1 g/dL (ref 6.1–8.1)
eGFR: 80 mL/min/{1.73_m2} (ref >=60)

## 2022-10-21 LAB — LIPID PANEL
Cholesterol: 159 mg/dL (ref <200)
HDL: 76 mg/dL (ref >=50)
LDL Cholesterol (Calc): 68 mg/dL (calc)
Non-HDL Cholesterol (Calc): 83 mg/dL (calc) (ref <130)
Total CHOL/HDL Ratio: 2.1 (calc) (ref <5.0)
Triglycerides: 66 mg/dL (ref <150)

## 2022-10-21 LAB — CBC
HCT: 40.7 % (ref 35.0–45.0)
Hemoglobin: 13.9 g/dL (ref 11.7–15.5)
MCH: 32.6 pg (ref 27.0–33.0)
MCHC: 34.2 g/dL (ref 32.0–36.0)
MCV: 95.3 fL (ref 80.0–100.0)
MPV: 12.8 fL — ABNORMAL HIGH (ref 7.5–12.5)
Platelets: 240 10*3/uL (ref 140–400)
RBC: 4.27 10*6/uL (ref 3.80–5.10)
RDW: 12.4 % (ref 11.0–15.0)
WBC: 9.3 10*3/uL (ref 3.8–10.8)

## 2022-11-03 ENCOUNTER — Ambulatory Visit: Payer: BC Managed Care – PPO | Admitting: Family Medicine

## 2022-11-03 NOTE — Progress Notes (Deleted)
     Established patient visit   Patient: Ashley Beard   DOB: 18-Dec-1965   56 y.o. Female  MRN: [id number] Visit Date: 11/03/2022  Today's healthcare provider: Owens Loffler, DO   No chief complaint on file.   SUBJECTIVE   No chief complaint on file.  HPI  Pt is here today for HTN follow up. She was started on hctz 12.'5mg'$  daily. Blood work done at last visit was wnl.   Review of Systems     No outpatient medications have been marked as taking for the 11/03/22 encounter (Appointment) with Owens Loffler, DO.    OBJECTIVE    LMP 05/07/2018   Physical Exam       ASSESSMENT & PLAN    Problem List Items Addressed This Visit   None   No follow-ups on file.      No orders of the defined types were placed in this encounter.   No orders of the defined types were placed in this encounter.    Owens Loffler, DO  [facility] [phone] (phone) [phone] (fax)  [place]

## 2022-11-05 ENCOUNTER — Ambulatory Visit: Payer: BC Managed Care – PPO | Admitting: Family Medicine

## 2022-11-05 ENCOUNTER — Encounter: Payer: Self-pay | Admitting: Family Medicine

## 2022-11-05 VITALS — BP 152/100 | HR 77 | Ht 59.0 in | Wt 146.0 lb

## 2022-11-05 DIAGNOSIS — I10 Essential (primary) hypertension: Secondary | ICD-10-CM | POA: Diagnosis not present

## 2022-11-05 MED ORDER — LISINOPRIL 20 MG PO TABS: 20.0000 mg | ORAL_TABLET | Freq: Every day | ORAL | 3 refills | Status: DC

## 2022-11-05 NOTE — Assessment & Plan Note (Signed)
-   BP elevated  - will go ahead and add lisinopril '20mg'$  to regimen in addition to her hctz - nurse visit in 2 weeks

## 2022-11-05 NOTE — Progress Notes (Signed)
Established patient visit   Patient: Ashley Beard   DOB: [birth year]   56 y.o. Female  MRN: [id number] Visit Date: 11/05/2022  Today's healthcare provider: Owens Loffler, DO   Chief Complaint  Patient presents with   Follow-up    SUBJECTIVE    Chief Complaint  Patient presents with   Follow-up   HPI  Pt presents for HTN follow up. At last visit she was started on hctz 12.'5mg'$ . BP was elevated today 168/88. Denies chest pain, shortness of breath, blurry vision.  Review of Systems  Constitutional:  Negative for activity change, fatigue and fever.  Respiratory:  Negative for cough and shortness of breath.   Cardiovascular:  Negative for chest pain.  Gastrointestinal:  Negative for abdominal pain.  Genitourinary:  Negative for difficulty urinating.       Current Meds  Medication Sig   amitriptyline (ELAVIL) 25 MG tablet TAKE 2 TABLETS BY MOUTH AT BEDTIME   aspirin 325 MG tablet Take 1 tablet (325 mg total) by mouth daily.   atorvastatin (LIPITOR) 40 MG tablet Take 1 tablet (40 mg total) by mouth daily.   divalproex (DEPAKOTE ER) 500 MG 24 hr tablet Take 1 tablet (500 mg total) by mouth daily.   hydrochlorothiazide (HYDRODIURIL) 12.5 MG tablet Take 1 tablet (12.5 mg total) by mouth daily.   lisinopril (ZESTRIL) 20 MG tablet Take 1 tablet (20 mg total) by mouth daily.   Multiple Vitamin (MULTIVITAMIN) capsule Take 1 capsule by mouth daily.   Omega-3 Fatty Acids (FISH OIL) 500 MG CAPS Take 1 capsule by mouth daily.   PARoxetine (PAXIL) 20 MG tablet Take 20 mg by mouth daily.    potassium chloride (KLOR-CON) 10 MEQ tablet Take 1 tablet (10 mEq total) by mouth daily.   topiramate (TOPAMAX) 100 MG tablet Take 1 tablet (100 mg total) by mouth at bedtime.   UBRELVY 50 MG TABS     OBJECTIVE    BP (!) 168/88   Pulse 77   Ht '4\' 11"'$  (1.499 m)   Wt 146 lb (66.2 kg)   LMP 05/07/2018   SpO2 100%   BMI 29.49 kg/m   Physical Exam Vitals and nursing note reviewed.   Constitutional:      General: She is not in acute distress.    Appearance: Normal appearance.  HENT:     Head: Normocephalic and atraumatic.     Right Ear: External ear normal.     Left Ear: External ear normal.     Nose: Nose normal.  Eyes:     Conjunctiva/sclera: Conjunctivae normal.  Cardiovascular:     Rate and Rhythm: Normal rate and regular rhythm.  Pulmonary:     Effort: Pulmonary effort is normal.     Breath sounds: Normal breath sounds.  Neurological:     General: No focal deficit present.     Mental Status: She is alert and oriented to person, place, and time.  Psychiatric:        Mood and Affect: Mood normal.        Behavior: Behavior normal.        Thought Content: Thought content normal.        Judgment: Judgment normal.        ASSESSMENT & PLAN    Problem List Items Addressed This Visit       Cardiovascular and Mediastinum   Primary hypertension - Primary    - BP elevated  - will go ahead and add lisinopril  $'20mg'S$  to regimen in addition to her hctz - nurse visit in 2 weeks       Relevant Medications   lisinopril (ZESTRIL) 20 MG tablet    Return in about 2 weeks (around 11/19/2022).      Meds ordered this encounter  Medications   lisinopril (ZESTRIL) 20 MG tablet    Sig: Take 1 tablet (20 mg total) by mouth daily.    Dispense:  30 tablet    Refill:  3    No orders of the defined types were placed in this encounter.    Owens Loffler, DO  [facility] [phone] (phone) [phone] (fax)  [place]

## 2022-11-19 ENCOUNTER — Ambulatory Visit (INDEPENDENT_AMBULATORY_CARE_PROVIDER_SITE_OTHER): Payer: BC Managed Care – PPO | Admitting: Family Medicine

## 2022-11-19 VITALS — BP 163/83 | HR 62 | Ht 59.0 in | Wt 147.0 lb

## 2022-11-19 DIAGNOSIS — I10 Essential (primary) hypertension: Secondary | ICD-10-CM

## 2022-11-19 NOTE — Progress Notes (Signed)
   Subjective:    Patient ID: Ashley Beard, female    DOB: 12/06/1966, 56 y.o.   MRN: [id number]  HPI Pt here for blood pressure check, pt denies trouble sleeping, palpitations, or headaches. Pt states she has not started Hydrodiuril.   Review of Systems     Objective:   Physical Exam        Assessment & Plan:   Pt advised to start Hydrodiuril and continue home monitoring of blood pressures and follow up with PCP.

## 2022-11-19 NOTE — Progress Notes (Signed)
Medical screening examination/treatment was performed by qualified clinical staff member and as supervising physician I was immediately available for consultation/collaboration. I have reviewed documentation and agree with assessment and plan.  Zyliah Schier, DO  

## 2022-12-03 ENCOUNTER — Ambulatory Visit (INDEPENDENT_AMBULATORY_CARE_PROVIDER_SITE_OTHER): Payer: BC Managed Care – PPO | Admitting: Medical-Surgical

## 2022-12-03 VITALS — BP 140/76 | HR 65 | Ht 59.0 in | Wt 147.0 lb

## 2022-12-03 DIAGNOSIS — I10 Essential (primary) hypertension: Secondary | ICD-10-CM

## 2022-12-03 NOTE — Progress Notes (Signed)
Agree with documentation as below.  ___________________________________________ Jalynne Persico L. Idali Lafever, DNP, APRN, FNP-BC Primary Care and Sports Medicine [facility]  

## 2022-12-03 NOTE — Progress Notes (Signed)
   Subjective:    Patient ID: Ashley Beard, female    DOB: Nov 26, 1966, 56 y.o.   MRN: [id number]  HPI Pt here for blood pressure check, pt denies any missed doses of HCTZ however pt states she has not started the lisinopril. Pt denies headache, palpitations or blurred vision.   Review of Systems     Objective:   Physical Exam        Assessment & Plan:   Pts second blood pressure today was 140/76, pt advised to start lisinopril '20mg'$  daily and rtc in 2 weeks for blood pressure check.

## 2022-12-04 ENCOUNTER — Ambulatory Visit: Payer: BC Managed Care – PPO

## 2022-12-17 ENCOUNTER — Ambulatory Visit (INDEPENDENT_AMBULATORY_CARE_PROVIDER_SITE_OTHER): Payer: BC Managed Care – PPO | Admitting: Family Medicine

## 2022-12-17 VITALS — BP 136/83 | HR 70 | Ht 59.0 in | Wt 144.0 lb

## 2022-12-17 DIAGNOSIS — I10 Essential (primary) hypertension: Secondary | ICD-10-CM

## 2022-12-17 NOTE — Progress Notes (Signed)
Medical screening examination/treatment was performed by qualified clinical staff member and as supervising physician I was immediately available for consultation/collaboration. I have reviewed documentation and agree with assessment and plan.  Beya Tipps, DO  

## 2022-12-17 NOTE — Progress Notes (Signed)
   Subjective:    Patient ID: Ashley Beard, female    DOB: [birth year], 57 y.o.   MRN: [id number]  HPI Pt here for bp check, pt states she has stopped lisinopril due to chest tightness and rash on face that she believes came from medication. Pt states she is still taking HCTZ and denies headache or blurred vision.    Review of Systems     Objective:   Physical Exam        Assessment & Plan:  Pts bp today was 136/83, pt advised to discontinue lisinopril and continue HCTZ and follow up in 3 months with pcp.

## 2023-03-22 DIAGNOSIS — D329 Benign neoplasm of meninges, unspecified: Secondary | ICD-10-CM | POA: Diagnosis not present

## 2023-03-22 DIAGNOSIS — G43009 Migraine without aura, not intractable, without status migrainosus: Secondary | ICD-10-CM | POA: Diagnosis not present

## 2023-03-22 DIAGNOSIS — Z1330 Encounter for screening examination for mental health and behavioral disorders, unspecified: Secondary | ICD-10-CM | POA: Diagnosis not present

## 2023-03-22 DIAGNOSIS — Z8673 Personal history of transient ischemic attack (TIA), and cerebral infarction without residual deficits: Secondary | ICD-10-CM | POA: Diagnosis not present

## 2023-04-28 ENCOUNTER — Other Ambulatory Visit: Payer: Self-pay | Admitting: Family Medicine

## 2023-05-06 ENCOUNTER — Ambulatory Visit: Payer: BC Managed Care – PPO | Admitting: Family Medicine

## 2023-05-07 ENCOUNTER — Other Ambulatory Visit: Payer: Self-pay | Admitting: Family Medicine

## 2023-05-28 DIAGNOSIS — L719 Rosacea, unspecified: Secondary | ICD-10-CM | POA: Diagnosis not present

## 2023-10-27 ENCOUNTER — Other Ambulatory Visit: Payer: Self-pay | Admitting: Family Medicine

## 2023-12-23 DIAGNOSIS — D329 Benign neoplasm of meninges, unspecified: Secondary | ICD-10-CM | POA: Diagnosis not present

## 2023-12-23 DIAGNOSIS — Z1330 Encounter for screening examination for mental health and behavioral disorders, unspecified: Secondary | ICD-10-CM | POA: Diagnosis not present

## 2023-12-23 DIAGNOSIS — G43009 Migraine without aura, not intractable, without status migrainosus: Secondary | ICD-10-CM | POA: Diagnosis not present

## 2023-12-23 DIAGNOSIS — F22 Delusional disorders: Secondary | ICD-10-CM | POA: Diagnosis not present

## 2023-12-23 DIAGNOSIS — Z8673 Personal history of transient ischemic attack (TIA), and cerebral infarction without residual deficits: Secondary | ICD-10-CM | POA: Diagnosis not present

## 2024-01-05 DIAGNOSIS — H52223 Regular astigmatism, bilateral: Secondary | ICD-10-CM | POA: Diagnosis not present

## 2024-01-05 DIAGNOSIS — H524 Presbyopia: Secondary | ICD-10-CM | POA: Diagnosis not present

## 2024-01-05 DIAGNOSIS — H5203 Hypermetropia, bilateral: Secondary | ICD-10-CM | POA: Diagnosis not present

## 2024-01-06 DIAGNOSIS — G43009 Migraine without aura, not intractable, without status migrainosus: Secondary | ICD-10-CM | POA: Diagnosis not present

## 2024-01-06 DIAGNOSIS — D329 Benign neoplasm of meninges, unspecified: Secondary | ICD-10-CM | POA: Diagnosis not present

## 2024-01-06 DIAGNOSIS — Z8673 Personal history of transient ischemic attack (TIA), and cerebral infarction without residual deficits: Secondary | ICD-10-CM | POA: Diagnosis not present

## 2024-01-06 DIAGNOSIS — D320 Benign neoplasm of cerebral meninges: Secondary | ICD-10-CM | POA: Diagnosis not present

## 2024-01-11 DIAGNOSIS — F22 Delusional disorders: Secondary | ICD-10-CM | POA: Diagnosis not present

## 2024-01-11 DIAGNOSIS — Z8673 Personal history of transient ischemic attack (TIA), and cerebral infarction without residual deficits: Secondary | ICD-10-CM | POA: Diagnosis not present

## 2024-01-11 DIAGNOSIS — R413 Other amnesia: Secondary | ICD-10-CM | POA: Diagnosis not present

## 2024-01-11 DIAGNOSIS — D329 Benign neoplasm of meninges, unspecified: Secondary | ICD-10-CM | POA: Diagnosis not present

## 2024-02-02 ENCOUNTER — Ambulatory Visit
Admission: EM | Admit: 2024-02-02 | Discharge: 2024-02-02 | Disposition: A | Discharge status: Home / Self Care | Payer: BC Managed Care – PPO | Attending: Family Medicine | Admitting: Family Medicine

## 2024-02-02 DIAGNOSIS — R21 Rash and other nonspecific skin eruption: Secondary | ICD-10-CM

## 2024-02-02 DIAGNOSIS — I10 Essential (primary) hypertension: Secondary | ICD-10-CM

## 2024-02-02 DIAGNOSIS — R059 Cough, unspecified: Secondary | ICD-10-CM

## 2024-02-02 MED ORDER — METHYLPREDNISOLONE 4 MG PO TBPK: ORAL_TABLET | ORAL | 0 refills | Status: DC

## 2024-02-02 NOTE — Discharge Instructions (Addendum)
 Advised patient to check blood pressure in the morning after using bathroom and prior to eating breakfast.  Advised patient to log these measurements for the next 10 to 14 days so that PCP can better understand daily blood pressure trends.  Advised patient to take medication as directed with food to completion.  Encouraged to increase daily water intake to 64 ounces per day while taking this medication.  Advised if symptoms worsen and/or unresolved please follow-up with your PCP or here for further evaluation.

## 2024-02-02 NOTE — ED Triage Notes (Signed)
 Pt presents to [facility] with co of recent uri 3 weeks ago with cough that has remained with rash on face. Pt reports elevated bp last night and this morning.

## 2024-02-02 NOTE — ED Provider Notes (Signed)
 [facility]    CSN: [id number] Arrival date & time: 02/02/24  1423      History   Chief Complaint Chief Complaint  Patient presents with   Cough   Hypertension   Rash    HPI Ashley Beard is a 58 y.o. female.   HPI  Past Medical History:  Diagnosis Date   Abnormal Pap smear of cervix    age 61   Anxiety    Carotid arterial disease (HCC)    Headache    Meningioma, cerebral (HCC) 08/18/2018   5 x 12 mm meningioma left middle cranial fossa similar in size to the prior MRI of 02/25/2017   Refusal of blood transfusions as patient is Jehovah's Witness    Stroke [facility])    Trigeminal neuralgia 06/22/2017   Dx by neuro. Takes Tegretol prn    Patient Active Problem List   Diagnosis Date Noted   Right shoulder pain 06/28/2020   Hypotension 12/19/2019   Encounter for completion of form with patient 12/19/2019   Caregiver stress syndrome 02/13/2019   Chronic migraine 09/15/2018   Hypokalemia due to loss of potassium 08/29/2018   Meningioma, cerebral (HCC) 08/18/2018   Encounter for medication management 06/25/2018   Other headache syndrome 06/13/2018   Abnormal mammogram of left breast 10/06/2017   Trigeminal neuralgia 06/22/2017   Right homonymous hemianopsia 03/16/2017   History of cerebrovascular accident (CVA) from left carotid artery occlusion involving left middle cerebral artery territory 02/26/2017   Primary hypertension 02/22/2017   Carotid artery disease (HCC) 02/22/2017    Past Surgical History:  Procedure Laterality Date   ENDARTERECTOMY Left 02/24/2017   Procedure: ENDARTERECTOMY CAROTID with removal of thrombus ;  Surgeon: Larina Earthly, MD;  Location: [facility] OR;  Service: Vascular;  Laterality: Left;   GYNECOLOGIC CRYOSURGERY     age 35   IR GENERIC HISTORICAL  02/25/2017   IR ANGIO VERTEBRAL SEL SUBCLAVIAN INNOMINATE UNI R MOD SED 02/25/2017 Julieanne Cotton, MD [facility]   IR GENERIC HISTORICAL  02/25/2017   IR ANGIO INTRA EXTRACRAN SEL COM  CAROTID INNOMINATE BILAT MOD SED 02/25/2017 Julieanne Cotton, MD [facility]   OOPHORECTOMY     TEE WITHOUT CARDIOVERSION N/A 05/04/2017   Procedure: TRANSESOPHAGEAL ECHOCARDIOGRAM (TEE);  Surgeon: Lewayne Bunting, MD;  Location: [facility] ENDOSCOPY;  Service: Cardiovascular;  Laterality: N/A;    OB History   No obstetric history on file.    Obstetric Comments  N/A          Home Medications    Prior to Admission medications   Medication Sig Start Date End Date Taking? Authorizing Provider  methylPREDNISolone (MEDROL DOSEPAK) 4 MG TBPK tablet Take as directed 02/02/24  Yes Trevor Iha, FNP  aspirin 325 MG tablet Take 1 tablet (325 mg total) by mouth daily. 09/05/18   Carlis Stable, PA-C  divalproex (DEPAKOTE ER) 500 MG 24 hr tablet Take 1 tablet (500 mg total) by mouth daily. 12/18/19   Micki Riley, MD  hydrochlorothiazide (HYDRODIURIL) 12.5 MG tablet Take 1 tablet (12.5 mg total) by mouth daily. 10/20/22   Charlton Amor, DO  Multiple Vitamin (MULTIVITAMIN) capsule Take 1 capsule by mouth daily.    [provider]  Omega-3 Fatty Acids (FISH OIL) 500 MG CAPS Take 1 capsule by mouth daily.    [provider]  PARoxetine (PAXIL) 20 MG tablet Take 20 mg by mouth daily.  04/05/19   [provider]  potassium chloride (KLOR-CON) 10 MEQ tablet  Take 1 tablet (10 mEq total) by mouth daily. 01/24/20   Sunnie Nielsen, DO  UBRELVY 50 MG TABS  09/05/19   [provider]    Family History Family History  Problem Relation Age of Onset   Diabetes Maternal Grandmother        Died of heart attack   Hypertension Maternal Grandmother    Colon cancer Maternal Grandmother    Stroke Paternal Grandfather    Esophageal cancer Neg Hx     Social History Social History   Tobacco Use   Smoking status: Former    Current packs/day: 0.00    Types: Cigarettes    Quit date: 09/2016    Years since quitting: 7.4   Smokeless tobacco: Never   Tobacco  comments:    very rare, social  Vaping Use   Vaping status: Never Used  Substance Use Topics   Alcohol use: Yes    Alcohol/week: 2.0 standard drinks of alcohol    Types: 2 Cans of beer per week    Comment: social   Drug use: No     Allergies   Penicillins, Fish allergy, Pineapple, Wheat, Other, Shrimp [shellfish allergy], and Yeast-derived drug products   Review of Systems Review of Systems  Respiratory:  Positive for cough.   Cardiovascular:        Concerned with elevated blood pressure measurement earlier today at work  Skin:  Positive for rash.     Physical Exam Triage Vital Signs ED Triage Vitals  Encounter Vitals Group     BP      Systolic BP Percentile      Diastolic BP Percentile      Pulse      Resp      Temp      Temp src      SpO2      Weight      Height      Head Circumference      Peak Flow      Pain Score      Pain Loc      Pain Education      Exclude from Growth Chart    No data found.  Updated Vital Signs BP (!) 138/92 (BP Location: Left Arm)   Pulse 85   Temp 98 F (36.7 C)   Resp 16   LMP 05/07/2018   SpO2 95%      Physical Exam Vitals and nursing note reviewed.  Constitutional:      Appearance: Normal appearance. She is normal weight.  HENT:     Head: Normocephalic and atraumatic.     Mouth/Throat:     Mouth: Mucous membranes are moist.     Pharynx: Oropharynx is clear.  Eyes:     Extraocular Movements: Extraocular movements intact.     Conjunctiva/sclera: Conjunctivae normal.     Pupils: Pupils are equal, round, and reactive to light.  Cardiovascular:     Rate and Rhythm: Normal rate and regular rhythm.     Pulses: Normal pulses.     Heart sounds: Normal heart sounds.  Pulmonary:     Effort: Pulmonary effort is normal.     Breath sounds: Normal breath sounds. No wheezing, rhonchi or rales.  Musculoskeletal:        General: Normal range of motion.     Cervical back: Normal range of motion and neck supple.  Skin:     General: Skin is warm and dry.     Comments: Face: Erythematous rash  noted over bridge of nose and cheeks-please see image below  Neurological:     General: No focal deficit present.     Mental Status: She is alert and oriented to person, place, and time. Mental status is at baseline.  Psychiatric:        Mood and Affect: Mood normal.        Behavior: Behavior normal.      UC Treatments / Results  Labs (all labs ordered are listed, but only abnormal results are displayed) Labs Reviewed - No data to display  EKG   Radiology No results found.  Procedures Procedures (including critical care time)  Medications Ordered in UC Medications - No data to display  Initial Impression / Assessment and Plan / UC Course  I have reviewed the triage vital signs and the nursing notes.  Pertinent labs & imaging results that were available during my care of the patient were reviewed by me and considered in my medical decision making (see chart for details).     MDM: 1.  Primary hypertension-advised patient to continue daily HCTZ 12.5 mg.Advised patient to check blood pressure in the morning after using bathroom and prior to eating breakfast.  Advised patient to log these measurements for the next 10 to 14 days so that PCP can better understand daily blood pressure trends.;  2.  Rash, nonspecific skin eruption-Rx'd Medrol Dosepak: Take as directed; 3.  Cough, unspecified type-Rx'd Medrol Dosepak: Take as directed. Advised patient to take medication as directed with food to completion.  Encouraged to increase daily water intake to 64 ounces per day while taking this medication.  Advised if symptoms worsen and/or unresolved please follow-up with your PCP or here for further evaluation.  Patient discharged home, hemodynamically since stable. Final Clinical Impressions(s) / UC Diagnoses   Final diagnoses:  Primary hypertension  Cough, unspecified type  Rash and nonspecific skin eruption      Discharge Instructions      Advised patient to check blood pressure in the morning after using bathroom and prior to eating breakfast.  Advised patient to log these measurements for the next 10 to 14 days so that PCP can better understand daily blood pressure trends.  Advised patient to take medication as directed with food to completion.  Encouraged to increase daily water intake to 64 ounces per day while taking this medication.  Advised if symptoms worsen and/or unresolved please follow-up with your PCP or here for further evaluation.     ED Prescriptions     Medication Sig Dispense Auth. Provider   methylPREDNISolone (MEDROL DOSEPAK) 4 MG TBPK tablet Take as directed 1 each Trevor Iha, FNP      PDMP not reviewed this encounter.   Trevor Iha, FNP 02/02/24 [phone]

## 2024-02-29 DIAGNOSIS — M62838 Other muscle spasm: Secondary | ICD-10-CM | POA: Diagnosis not present

## 2024-02-29 DIAGNOSIS — M545 Low back pain, unspecified: Secondary | ICD-10-CM | POA: Diagnosis not present

## 2024-02-29 DIAGNOSIS — N3001 Acute cystitis with hematuria: Secondary | ICD-10-CM | POA: Diagnosis not present

## 2024-03-08 DIAGNOSIS — R413 Other amnesia: Secondary | ICD-10-CM | POA: Diagnosis not present

## 2024-03-08 DIAGNOSIS — D329 Benign neoplasm of meninges, unspecified: Secondary | ICD-10-CM | POA: Diagnosis not present

## 2024-03-08 DIAGNOSIS — F22 Delusional disorders: Secondary | ICD-10-CM | POA: Diagnosis not present

## 2024-03-08 DIAGNOSIS — Z8673 Personal history of transient ischemic attack (TIA), and cerebral infarction without residual deficits: Secondary | ICD-10-CM | POA: Diagnosis not present

## 2024-03-22 DIAGNOSIS — R419 Unspecified symptoms and signs involving cognitive functions and awareness: Secondary | ICD-10-CM | POA: Diagnosis not present

## 2024-04-13 ENCOUNTER — Encounter: Payer: Self-pay | Admitting: Family Medicine

## 2024-05-04 DIAGNOSIS — F22 Delusional disorders: Secondary | ICD-10-CM | POA: Diagnosis not present

## 2024-05-17 DIAGNOSIS — J069 Acute upper respiratory infection, unspecified: Secondary | ICD-10-CM | POA: Diagnosis not present

## 2024-05-17 DIAGNOSIS — L309 Dermatitis, unspecified: Secondary | ICD-10-CM | POA: Diagnosis not present

## 2024-05-17 DIAGNOSIS — Z20822 Contact with and (suspected) exposure to covid-19: Secondary | ICD-10-CM | POA: Diagnosis not present

## 2024-06-08 DIAGNOSIS — L309 Dermatitis, unspecified: Secondary | ICD-10-CM | POA: Diagnosis not present

## 2024-06-08 DIAGNOSIS — L718 Other rosacea: Secondary | ICD-10-CM | POA: Diagnosis not present

## 2024-06-13 DIAGNOSIS — F411 Generalized anxiety disorder: Secondary | ICD-10-CM | POA: Diagnosis not present

## 2024-06-13 DIAGNOSIS — F22 Delusional disorders: Secondary | ICD-10-CM | POA: Diagnosis not present

## 2024-06-13 DIAGNOSIS — F331 Major depressive disorder, recurrent, moderate: Secondary | ICD-10-CM | POA: Diagnosis not present

## 2024-06-19 DIAGNOSIS — L309 Dermatitis, unspecified: Secondary | ICD-10-CM | POA: Diagnosis not present

## 2024-06-22 ENCOUNTER — Ambulatory Visit: Payer: Self-pay

## 2024-06-22 ENCOUNTER — Ambulatory Visit: Admission: EM | Admit: 2024-06-22 | Discharge: 2024-06-22 | Disposition: A | Discharge status: Home / Self Care

## 2024-06-22 DIAGNOSIS — H00025 Hordeolum internum left lower eyelid: Secondary | ICD-10-CM | POA: Diagnosis not present

## 2024-06-22 DIAGNOSIS — R1310 Dysphagia, unspecified: Secondary | ICD-10-CM

## 2024-06-22 HISTORY — DX: Rosacea, unspecified: L71.9

## 2024-06-22 NOTE — ED Provider Notes (Signed)
 [facility]    CSN: [id number] Arrival date & time: 06/22/24  1531      History   Chief Complaint Chief Complaint  Patient presents with   Eye Problem    HPI INETHA MARET is a 58 y.o. female.   HPI 58 year old female presents with left eye redness and swelling for 4 days.  Additionally, patient reports difficulty swallowing over the past 4 days.  Patient is currently taking doxycycline for her rosacea and prescribed by dermatology.  PMH significant for stroke, trigeminal neuralgia, and HTN.  Past Medical History:  Diagnosis Date   Abnormal Pap smear of cervix    age 19   Anxiety    Carotid arterial disease (HCC)    Headache    Meningioma, cerebral (HCC) 08/18/2018   5 x 12 mm meningioma left middle cranial fossa similar in size to the prior MRI of 02/25/2017   Refusal of blood transfusions as patient is Jehovah's Witness    Rosacea    Stroke [facility])    Trigeminal neuralgia 06/22/2017   Dx by neuro. Takes Tegretol  prn    Patient Active Problem List   Diagnosis Date Noted   Right shoulder pain 06/28/2020   Hypotension 12/19/2019   Encounter for completion of form with patient 12/19/2019   Caregiver stress syndrome 02/13/2019   Chronic migraine 09/15/2018   Hypokalemia due to loss of potassium 08/29/2018   Meningioma, cerebral (HCC) 08/18/2018   Encounter for medication management 06/25/2018   Other headache syndrome 06/13/2018   Abnormal mammogram of left breast 10/06/2017   Trigeminal neuralgia 06/22/2017   Right homonymous hemianopsia 03/16/2017   History of cerebrovascular accident (CVA) from left carotid artery occlusion involving left middle cerebral artery territory 02/26/2017   Primary hypertension 02/22/2017   Carotid artery disease (HCC) 02/22/2017    Past Surgical History:  Procedure Laterality Date   ENDARTERECTOMY Left 02/24/2017   Procedure: ENDARTERECTOMY CAROTID with removal of thrombus ;  Surgeon: Krystal JULIANNA Doing, MD;  Location: [facility] OR;   Service: Vascular;  Laterality: Left;   GYNECOLOGIC CRYOSURGERY     age 75   IR GENERIC HISTORICAL  02/25/2017   IR ANGIO VERTEBRAL SEL SUBCLAVIAN INNOMINATE UNI R MOD SED 02/25/2017 Thyra Nash, MD [facility]   IR GENERIC HISTORICAL  02/25/2017   IR ANGIO INTRA EXTRACRAN SEL COM CAROTID INNOMINATE BILAT MOD SED 02/25/2017 Thyra Nash, MD [facility]   OOPHORECTOMY     TEE WITHOUT CARDIOVERSION N/A 05/04/2017   Procedure: TRANSESOPHAGEAL ECHOCARDIOGRAM (TEE);  Surgeon: Pietro Redell RAMAN, MD;  Location: [facility] ENDOSCOPY;  Service: Cardiovascular;  Laterality: N/A;    OB History   No obstetric history on file.    Obstetric Comments  N/A          Home Medications    Prior to Admission medications   Medication Sig Start Date End Date Taking? Authorizing Provider  aspirin  325 MG tablet Take 1 tablet (325 mg total) by mouth daily. 09/05/18  Yes Tommas Severa Norris, PA-C  divalproex  (DEPAKOTE  ER) 500 MG 24 hr tablet Take 1 tablet (500 mg total) by mouth daily. 12/18/19  Yes Rosemarie Eather RAMAN, MD  doxycycline (VIBRAMYCIN) 100 MG capsule Take 100 mg by mouth 2 (two) times daily. 06/19/24  Yes [provider]  hydrochlorothiazide  (HYDRODIURIL ) 12.5 MG tablet Take 1 tablet (12.5 mg total) by mouth daily. 10/20/22  Yes Bevin Asal S, DO  methylPREDNISolone  (MEDROL  DOSEPAK) 4 MG TBPK tablet Take as directed 02/02/24  Yes Teddy Sharper,  FNP  Multiple Vitamin (MULTIVITAMIN) capsule Take 1 capsule by mouth daily.   Yes [provider]  Omega-3 Fatty Acids (FISH OIL) 500 MG CAPS Take 1 capsule by mouth daily.   Yes [provider]  PARoxetine (PAXIL) 20 MG tablet Take 20 mg by mouth daily.  04/05/19  Yes [provider]  potassium chloride  (KLOR-CON ) 10 MEQ tablet Take 1 tablet (10 mEq total) by mouth daily. 01/24/20  Yes Marsa Edelman, DO  UBRELVY 50 MG TABS  09/05/19  Yes [provider]    Family History Family History  Problem  Relation Age of Onset   Diabetes Maternal Grandmother        Died of heart attack   Hypertension Maternal Grandmother    Colon cancer Maternal Grandmother    Stroke Paternal Grandfather    Esophageal cancer Neg Hx     Social History Social History   Tobacco Use   Smoking status: Former    Current packs/day: 0.00    Types: Cigarettes    Quit date: 09/2016    Years since quitting: 7.7   Smokeless tobacco: Never   Tobacco comments:    very rare, social  Vaping Use   Vaping status: Never Used  Substance Use Topics   Alcohol use: Yes    Alcohol/week: 2.0 standard drinks of alcohol    Types: 2 Cans of beer per week    Comment: social   Drug use: No     Allergies   Penicillins, Fish allergy, Pineapple, Wheat, Other, Shrimp [shellfish allergy], and Yeast-derived drug products   Review of Systems Review of Systems  HENT:  Positive for trouble swallowing.   Eyes:  Positive for redness.  All other systems reviewed and are negative.    Physical Exam Triage Vital Signs ED Triage Vitals  Encounter Vitals Group     BP      Girls Systolic BP Percentile      Girls Diastolic BP Percentile      Boys Systolic BP Percentile      Boys Diastolic BP Percentile      Pulse      Resp      Temp      Temp src      SpO2      Weight      Height      Head Circumference      Peak Flow      Pain Score      Pain Loc      Pain Education      Exclude from Growth Chart    No data found.  Updated Vital Signs BP (!) 160/102 (BP Location: Right Arm)   Pulse 75   Temp 98.4 F (36.9 C) (Oral)   Resp 18   Ht 5' (1.524 m)   Wt 134 lb (60.8 kg)   LMP 05/07/2018   SpO2 98%   BMI 26.17 kg/m   Physical Exam Vitals and nursing note reviewed.  Constitutional:      Appearance: Normal appearance. She is normal weight. She is not ill-appearing.  HENT:     Head: Normocephalic and atraumatic.     Right Ear: Tympanic membrane, ear canal and external ear normal.     Left Ear: Tympanic  membrane, ear canal and external ear normal.     Mouth/Throat:     Mouth: Mucous membranes are moist.     Pharynx: Oropharynx is clear.  Eyes:     Extraocular Movements: Extraocular movements  intact.     Conjunctiva/sclera: Conjunctivae normal.     Pupils: Pupils are equal, round, and reactive to light.     Comments: Left lower eyelid: Erythematous, mildly indurated-please see image below  Cardiovascular:     Rate and Rhythm: Normal rate and regular rhythm.     Pulses: Normal pulses.     Heart sounds: Normal heart sounds.  Pulmonary:     Effort: Pulmonary effort is normal.     Breath sounds: Normal breath sounds. No wheezing, rhonchi or rales.  Musculoskeletal:        General: Normal range of motion.     Cervical back: Normal range of motion and neck supple.  Skin:    General: Skin is warm and dry.  Neurological:     General: No focal deficit present.     Mental Status: She is alert and oriented to person, place, and time.  Psychiatric:        Mood and Affect: Mood normal.        Behavior: Behavior normal.      UC Treatments / Results  Labs (all labs ordered are listed, but only abnormal results are displayed) Labs Reviewed - No data to display  EKG   Radiology No results found.  Procedures Procedures (including critical care time)  Medications Ordered in UC Medications - No data to display  Initial Impression / Assessment and Plan / UC Course  I have reviewed the triage vital signs and the nursing notes.  Pertinent labs & imaging results that were available during my care of the patient were reviewed by me and considered in my medical decision making (see chart for details).     MDM: 1.  Hordeolum internum of left lower eyelid-patient is currently on Doxy cyclin 100 mg capsule: Twice daily for 60 days prescribed initially by dermatology; 2.  Dysphagia, unspecified type. Advised patient take previously prescribed medication as directed with food completion.   Advised patient to schedule appointment/call PCP regarding need for EGD and difficulty swallowing for 4 days.  Encouraged increase daily water intake while taking his medication.  Advised if symptoms worsen and/or unresolved please follow-up with your PCP or here for further evaluation.  Final Clinical Impressions(s) / UC Diagnoses   Final diagnoses:  Hordeolum internum of left lower eyelid  Dysphagia, unspecified type     Discharge Instructions      Advised patient take previously prescribed medication as directed with food completion.  Advised patient to schedule appointment/call PCP regarding need for EGD and difficulty swallowing for 4 days.  Encouraged increase daily water intake while taking his medication.  Advised if symptoms worsen and/or unresolved please follow-up with your PCP or here for further evaluation.     ED Prescriptions   None    PDMP not reviewed this encounter.   Teddy Sharper, FNP 06/22/24 1708

## 2024-06-22 NOTE — Telephone Encounter (Signed)
 Triage incomplete. After 1st question and Pt stated I will just go to urgent care. I need to be seen today.        Copied from CRM [id number]. Topic: Clinical - Red Word Triage >> Jun 22, 2024 11:35 AM Miquel SAILOR wrote: Red Word that prompted transfer to Nurse Triage: LT Eye check Red since 07/13 /BP check due to 160/98 Since 07/13 Also when patient drinks feels like it get stuck since 07/13

## 2024-06-22 NOTE — ED Triage Notes (Signed)
 Pt states that she has some left eye redness and swelling. X4 days  Pt states that she has a hard time swallowing. Pt  states that she doesn't have a hard time breathing.  Pt states that when she swallows her throat hurts as if something is in it and won't go down. X4 days

## 2024-06-22 NOTE — Discharge Instructions (Addendum)
 Advised patient take previously prescribed medication as directed with food completion.  Advised patient to schedule appointment/call PCP regarding need for EGD and difficulty swallowing for 4 days.  Encouraged increase daily water intake while taking his medication.  Advised if symptoms worsen and/or unresolved please follow-up with your PCP or here for further evaluation.

## 2024-06-23 ENCOUNTER — Ambulatory Visit

## 2024-06-23 ENCOUNTER — Ambulatory Visit: Admitting: Sports Medicine

## 2024-06-23 ENCOUNTER — Encounter: Payer: Self-pay | Admitting: Sports Medicine

## 2024-06-23 ENCOUNTER — Telehealth (HOSPITAL_COMMUNITY): Payer: Self-pay | Admitting: *Deleted

## 2024-06-23 ENCOUNTER — Encounter: Payer: Self-pay | Admitting: Physician Assistant

## 2024-06-23 VITALS — BP 115/80 | HR 77 | Temp 97.9°F | Resp 20 | Ht 60.0 in | Wt 132.0 lb

## 2024-06-23 DIAGNOSIS — R1310 Dysphagia, unspecified: Secondary | ICD-10-CM | POA: Diagnosis not present

## 2024-06-23 MED ORDER — PANTOPRAZOLE SODIUM 40 MG PO TBEC: 40.0000 mg | DELAYED_RELEASE_TABLET | Freq: Every day | ORAL | 3 refills | Status: DC

## 2024-06-23 NOTE — Telephone Encounter (Signed)
 Attempted to contact patient to schedule OP MBS. Phone number [phone] not in service at this time. RKEEL

## 2024-06-23 NOTE — Assessment & Plan Note (Signed)
 This is a very pleasant 58 year old female, she does have a history of CVA approximately 5 years ago. She has never had any difficulty swallowing until about 5 days ago She developed what appears to be a stye, went to urgent care for treatment, discussed her difficulty swallowing. COVID and strep testing was negative at that time. She endorses food and water alike will get stuck in her throat, she will get a burning sensation, she denies any weight loss, no nausea, vomiting, melena, hematochezia, hematemesis. She does get some substernal burning when laying flat. Her exam is normal with the exception of some scalloping and a beefy appearance of her tongue. Head and neck exam normal, cranial nerves II through XII are intact, motor, sensory, coordination functions are all intact. Vitals are stable today. Suspect more of a laryngopharyngeal reflux type picture, adding pantoprazole , we will get a modified barium swallow study, and I would like her to discuss this with Dr. San. Chest x-ray. She will take small bites, masticate appropriately, and I will start her on a GERD type diet. She can follow-up with her new PCP in 4 to 6 weeks and go over efficacy of the above treatment, if she does well pantoprazole  can be discontinued.

## 2024-06-23 NOTE — Progress Notes (Signed)
    Procedures performed today:    None.  Independent interpretation of notes and tests performed by another provider:   None.  Brief History, Exam, Impression, and Recommendations:    Dysphagia This is a very pleasant 58 year old female, she does have a history of CVA approximately 5 years ago. She has never had any difficulty swallowing until about 5 days ago She developed what appears to be a stye, went to urgent care for treatment, discussed her difficulty swallowing. COVID and strep testing was negative at that time. She endorses food and water alike will get stuck in her throat, she will get a burning sensation, she denies any weight loss, no nausea, vomiting, melena, hematochezia, hematemesis. She does get some substernal burning when laying flat. Her exam is normal with the exception of some scalloping and a beefy appearance of her tongue. Head and neck exam normal, cranial nerves II through XII are intact, motor, sensory, coordination functions are all intact. Vitals are stable today. Suspect more of a laryngopharyngeal reflux type picture, adding pantoprazole , we will get a modified barium swallow study, and I would like her to discuss this with Dr. San. Chest x-ray. She will take small bites, masticate appropriately, and I will start her on a GERD type diet. She can follow-up with her new PCP in 4 to 6 weeks and go over efficacy of the above treatment, if she does well pantoprazole  can be discontinued.    ____________________________________________ Debby PARAS. Curtis, M.D., ABFM., CAQSM., AME. Primary Care and Sports Medicine [facility]  Adjunct Professor of [facility] Medicine  [employer]  Restaurant manager, fast food

## 2024-06-26 DIAGNOSIS — D329 Benign neoplasm of meninges, unspecified: Secondary | ICD-10-CM | POA: Diagnosis not present

## 2024-06-26 DIAGNOSIS — Z8673 Personal history of transient ischemic attack (TIA), and cerebral infarction without residual deficits: Secondary | ICD-10-CM | POA: Diagnosis not present

## 2024-06-26 DIAGNOSIS — G43009 Migraine without aura, not intractable, without status migrainosus: Secondary | ICD-10-CM | POA: Diagnosis not present

## 2024-06-27 ENCOUNTER — Ambulatory Visit: Admitting: Urgent Care

## 2024-06-29 ENCOUNTER — Ambulatory Visit: Payer: Self-pay | Admitting: Sports Medicine

## 2024-06-30 ENCOUNTER — Telehealth (HOSPITAL_COMMUNITY): Payer: Self-pay | Admitting: *Deleted

## 2024-06-30 DIAGNOSIS — F411 Generalized anxiety disorder: Secondary | ICD-10-CM | POA: Diagnosis not present

## 2024-06-30 DIAGNOSIS — F22 Delusional disorders: Secondary | ICD-10-CM | POA: Diagnosis not present

## 2024-06-30 DIAGNOSIS — F331 Major depressive disorder, recurrent, moderate: Secondary | ICD-10-CM | POA: Diagnosis not present

## 2024-06-30 NOTE — Telephone Encounter (Signed)
 Attempted to contact patient to schedule OP MBS. Patient phone [phone] still not working. Left VM on daughter's VM @ [phone]. RKEEL

## 2024-07-07 ENCOUNTER — Telehealth (HOSPITAL_COMMUNITY): Payer: Self-pay | Admitting: *Deleted

## 2024-07-07 NOTE — Telephone Encounter (Signed)
 Attempted to contact patient to schedule OP MBS. Unable to leave message-VM full @ [phone]. RKEEL

## 2024-07-14 ENCOUNTER — Telehealth (HOSPITAL_COMMUNITY): Payer: Self-pay | Admitting: *Deleted

## 2024-07-14 NOTE — Telephone Encounter (Signed)
 Unable to contact patient to schedule OP MBS. Attempted several times to reach out but the phone was not in service, VM full or VM not set up. Closing order at this time. Will need new order for scheduling in the future. RKEEL

## 2024-07-21 ENCOUNTER — Other Ambulatory Visit (HOSPITAL_COMMUNITY)
Admission: RE | Admit: 2024-07-21 | Discharge: 2024-07-21 | Disposition: A | Discharge status: Home / Self Care | Source: Ambulatory Visit | Attending: Urgent Care | Admitting: Urgent Care

## 2024-07-21 ENCOUNTER — Ambulatory Visit (INDEPENDENT_AMBULATORY_CARE_PROVIDER_SITE_OTHER): Admitting: Urgent Care

## 2024-07-21 ENCOUNTER — Encounter: Payer: Self-pay | Admitting: Urgent Care

## 2024-07-21 VITALS — BP 176/99 | HR 61 | Resp 17 | Ht 60.0 in | Wt 136.2 lb

## 2024-07-21 DIAGNOSIS — E876 Hypokalemia: Secondary | ICD-10-CM

## 2024-07-21 DIAGNOSIS — Z124 Encounter for screening for malignant neoplasm of cervix: Secondary | ICD-10-CM | POA: Diagnosis not present

## 2024-07-21 DIAGNOSIS — F331 Major depressive disorder, recurrent, moderate: Secondary | ICD-10-CM | POA: Diagnosis not present

## 2024-07-21 DIAGNOSIS — F411 Generalized anxiety disorder: Secondary | ICD-10-CM | POA: Insufficient documentation

## 2024-07-21 DIAGNOSIS — K219 Gastro-esophageal reflux disease without esophagitis: Secondary | ICD-10-CM | POA: Insufficient documentation

## 2024-07-21 DIAGNOSIS — F22 Delusional disorders: Secondary | ICD-10-CM | POA: Insufficient documentation

## 2024-07-21 DIAGNOSIS — L719 Rosacea, unspecified: Secondary | ICD-10-CM | POA: Insufficient documentation

## 2024-07-21 DIAGNOSIS — I10 Essential (primary) hypertension: Secondary | ICD-10-CM | POA: Diagnosis not present

## 2024-07-21 DIAGNOSIS — I779 Disorder of arteries and arterioles, unspecified: Secondary | ICD-10-CM | POA: Diagnosis not present

## 2024-07-21 MED ORDER — PAROXETINE HCL 20 MG PO TABS: 20.0000 mg | ORAL_TABLET | Freq: Every day | ORAL | 1 refills | Status: AC

## 2024-07-21 MED ORDER — METRONIDAZOLE 0.75 % EX GEL: 1.0000 | Freq: Two times a day (BID) | CUTANEOUS | 3 refills | Status: DC

## 2024-07-21 MED ORDER — POTASSIUM CHLORIDE ER 10 MEQ PO TBCR: 10.0000 meq | EXTENDED_RELEASE_TABLET | Freq: Every day | ORAL | 3 refills | Status: AC

## 2024-07-21 MED ORDER — CHLORTHALIDONE 25 MG PO TABS: 25.0000 mg | ORAL_TABLET | Freq: Every day | ORAL | 1 refills | Status: AC

## 2024-07-21 MED ORDER — CARIPRAZINE HCL 1.5 MG PO CAPS: 1.5000 mg | ORAL_CAPSULE | Freq: Every day | ORAL | 1 refills | Status: DC

## 2024-07-21 NOTE — Progress Notes (Signed)
 New Patient Office Visit  Subjective:  Patient ID: Ashley Beard, female    DOB: 08-20-1966  Age: 58 y.o. MRN: [id number]  CC:  Chief Complaint  Patient presents with   New Patient (Initial Visit)    Pt would like a pap    HPI Ashley Beard presents to establish care.  Discussed the use of AI scribe software for clinical note transcription with the patient, who gave verbal consent to proceed.  History of Present Illness   Ashley Beard is a 58 year old female who presents for FMLA paperwork related to her mental health.  She is seeking assistance with FMLA paperwork due to her mental health issues, specifically paranoia. She has been seeing a therapist at [facility] but was informed that the therapist could not fill out the paperwork. She requires time off work to attend therapy sessions, which she has been scheduling during her lunch hours. She requests several hours off every two weeks to accommodate these appointments.  She has a history of stroke in 2018, which occurred due to a piece of tissue traveling to her brain during surgery for carotid plaque removal. She was independent prior to the stroke but has experienced significant changes since then, including contracting COVID-19 twice.  She is currently taking several medications including Depakote  (Divalproex ) and Paxil  (Paroxetine ) for her mental health, which were initially prescribed by her migraine specialist. She sometimes does not take Paxil  daily, despite her daughter's advice to do so. Her paranoia is not well controlled, as she feels 'people are out to get me'. Her paranoia started in July 2023. This started after a stressful event near her home. She denies auditory or visual hallucinations. No SI/ HI.  She has a family history of schizophrenia, with her grandmother and sister having been diagnosed, but she has not been diagnosed with schizophrenia herself.  She is also on hydrochlorothiazide  for hypertension,  which is typically high. She previously tried another medication starting with 'L' but experienced adverse effects. She takes aspirin  for stroke prevention and has not been on cholesterol medication or Plavix .  She has a history of dysphagia, which she attributes to acid reflux, and is currently taking pantoprazole  (Protonix ) for this condition. The dysphagia has resolved.  She has a history of rosacea, for which she was previously prescribed doxycycline. She reports a breakout on her skin and was diagnosed with rosacea.  She takes [place] for migraines, which she reports as effective. She also takes fish oil and was previously on potassium chloride , which she has not taken in a while.  She is planning to move in with her daughter.       Outpatient Encounter Medications as of 07/21/2024  Medication Sig   aspirin  325 MG tablet Take 1 tablet (325 mg total) by mouth daily.   cariprazine  (VRAYLAR ) 1.5 MG capsule Take 1 capsule (1.5 mg total) by mouth daily.   chlorthalidone  (HYGROTON ) 25 MG tablet Take 1 tablet (25 mg total) by mouth daily.   divalproex  (DEPAKOTE  ER) 500 MG 24 hr tablet Take 1 tablet (500 mg total) by mouth daily.   metroNIDAZOLE  (METROGEL ) 0.75 % gel Apply 1 Application topically 2 (two) times daily.   Multiple Vitamin (MULTIVITAMIN) capsule Take 1 capsule by mouth daily.   Omega-3 Fatty Acids (FISH OIL) 500 MG CAPS Take 1 capsule by mouth daily.   pantoprazole  (PROTONIX ) 40 MG tablet Take 1 tablet (40 mg total) by mouth daily.   UBRELVY 50 MG TABS    [  DISCONTINUED] doxycycline (VIBRAMYCIN) 100 MG capsule Take 100 mg by mouth 2 (two) times daily.   [DISCONTINUED] hydrochlorothiazide  (HYDRODIURIL ) 12.5 MG tablet Take 1 tablet (12.5 mg total) by mouth daily.   [DISCONTINUED] PARoxetine  (PAXIL ) 20 MG tablet Take 20 mg by mouth daily.    [DISCONTINUED] potassium chloride  (KLOR-CON ) 10 MEQ tablet Take 1 tablet (10 mEq total) by mouth daily.   PARoxetine  (PAXIL ) 20 MG tablet Take 1  tablet (20 mg total) by mouth daily.   potassium chloride  (KLOR-CON ) 10 MEQ tablet Take 1 tablet (10 mEq total) by mouth daily.   [DISCONTINUED] methylPREDNISolone  (MEDROL  DOSEPAK) 4 MG TBPK tablet Take as directed (Patient not taking: Reported on 07/21/2024)   No facility-administered encounter medications on file as of 07/21/2024.    Past Medical History:  Diagnosis Date   Abnormal Pap smear of cervix    age 57   Anxiety    Carotid arterial disease (HCC)    Headache    Meningioma, cerebral (HCC) 08/18/2018   5 x 12 mm meningioma left middle cranial fossa similar in size to the prior MRI of 02/25/2017   Refusal of blood transfusions as patient is Jehovah's Witness    Rosacea    Stroke [facility])    Trigeminal neuralgia 06/22/2017   Dx by neuro. Takes Tegretol  prn    Past Surgical History:  Procedure Laterality Date   ENDARTERECTOMY Left 02/24/2017   Procedure: ENDARTERECTOMY CAROTID with removal of thrombus ;  Surgeon: Krystal JULIANNA Doing, MD;  Location: [facility] OR;  Service: Vascular;  Laterality: Left;   GYNECOLOGIC CRYOSURGERY     age 42   IR GENERIC HISTORICAL  02/25/2017   IR ANGIO VERTEBRAL SEL SUBCLAVIAN INNOMINATE UNI R MOD SED 02/25/2017 Thyra Nash, MD [facility]   IR GENERIC HISTORICAL  02/25/2017   IR ANGIO INTRA EXTRACRAN SEL COM CAROTID INNOMINATE BILAT MOD SED 02/25/2017 Thyra Nash, MD [facility]   OOPHORECTOMY     TEE WITHOUT CARDIOVERSION N/A 05/04/2017   Procedure: TRANSESOPHAGEAL ECHOCARDIOGRAM (TEE);  Surgeon: Pietro Redell RAMAN, MD;  Location: [facility] ENDOSCOPY;  Service: Cardiovascular;  Laterality: N/A;    Family History  Problem Relation Age of Onset   Diabetes Maternal Grandmother        Died of heart attack   Hypertension Maternal Grandmother    Colon cancer Maternal Grandmother    Stroke Paternal Grandfather    Esophageal cancer Neg Hx     Social History   Socioeconomic History   Marital status: Divorced    Spouse name: Not on file   Number of  children: 3   Years of education: Not on file   Highest education level: Not on file  Occupational History   Not on file  Tobacco Use   Smoking status: Former    Current packs/day: 0.00    Types: Cigarettes    Quit date: 09/2016    Years since quitting: 7.8   Smokeless tobacco: Never   Tobacco comments:    very rare, social  Vaping Use   Vaping status: Never Used  Substance and Sexual Activity   Alcohol use: Yes    Alcohol/week: 2.0 standard drinks of alcohol    Types: 2 Cans of beer per week    Comment: social   Drug use: No   Sexual activity: Yes    Birth control/protection: Condom  Other Topics Concern   Not on file  Social History Narrative   Not on file   Social Drivers of Health   Financial  Resource Strain: Medium Risk (01/11/2024)   Received from [facility]   Overall Financial Resource Strain (CARDIA)    Difficulty of Paying Living Expenses: Somewhat hard  Food Insecurity: Food Insecurity Present (05/04/2024)   Received from [facility]   Hunger Vital Sign    Within the past 12 months, you worried that your food would run out before you got the money to buy more.: Sometimes true    Within the past 12 months, the food you bought just didn't last and you didn't have money to get more.: Often true  Transportation Needs: No Transportation Needs (05/04/2024)   Received from [facility] - Transportation    Lack of Transportation (Medical): No    Lack of Transportation (Non-Medical): No  Physical Activity: Unknown (01/11/2024)   Received from [facility]   Exercise Vital Sign    On average, how many days per week do you engage in moderate to strenuous exercise (like a brisk walk)?: 0 days    Minutes of Exercise per Session: Not on file  Stress: Stress Concern Present (05/04/2024)   Received from [facility] - Occupational Stress Questionnaire    Feeling of Stress : Very much  Social Connections: Moderately  Integrated (01/11/2024)   Received from [facility]   Social Network    How would you rate your social network (family, work, friends)?: Adequate participation with social networks  Intimate Partner Violence: Not At Risk (05/04/2024)   Received from [facility]   HITS    Over the last 12 months how often did your partner physically hurt you?: Never    Over the last 12 months how often did your partner insult you or talk down to you?: Never    Over the last 12 months how often did your partner threaten you with physical harm?: Never    Over the last 12 months how often did your partner scream or curse at you?: Never    ROS: as noted in HPI  Objective:  BP (!) 176/99   Pulse 61   Resp 17   Ht 5' (1.524 m)   Wt 136 lb 4 oz (61.8 kg)   LMP 05/07/2018   SpO2 99%   BMI 26.61 kg/m   Physical Exam Vitals and nursing note reviewed. Exam conducted with a chaperone present.  Constitutional:      General: She is not in acute distress.    Appearance: Normal appearance. She is not ill-appearing, toxic-appearing or diaphoretic.  HENT:     Head: Normocephalic and atraumatic.     Right Ear: Tympanic membrane, ear canal and external ear normal. There is no impacted cerumen.     Left Ear: Tympanic membrane, ear canal and external ear normal. There is no impacted cerumen.     Nose: Nose normal.     Mouth/Throat:     Mouth: Mucous membranes are moist.     Pharynx: Oropharynx is clear. No oropharyngeal exudate or posterior oropharyngeal erythema.  Eyes:     General: No scleral icterus.       Right eye: No discharge.        Left eye: No discharge.     Extraocular Movements: Extraocular movements intact.     Pupils: Pupils are equal, round, and reactive to light.  Neck:     Thyroid : No thyroid  mass, thyromegaly or thyroid  tenderness.  Cardiovascular:     Rate and Rhythm: Normal rate and regular rhythm.  Pulses: Normal pulses.     Heart sounds: No murmur heard. Pulmonary:     Effort:  Pulmonary effort is normal. No respiratory distress.     Breath sounds: Normal breath sounds. No stridor. No wheezing or rhonchi.  Abdominal:     General: Abdomen is flat. Bowel sounds are normal. There is no distension.     Palpations: Abdomen is soft. There is no mass.     Tenderness: There is no abdominal tenderness. There is no guarding.  Genitourinary:    Pubic Area: No rash or pubic lice.      Labia:        Right: No rash, tenderness, lesion or injury.        Left: No rash, tenderness, lesion or injury.      Urethra: No prolapse, urethral pain, urethral swelling or urethral lesion.     Vagina: Normal. No vaginal discharge or lesions.     Cervix: Normal. No cervical motion tenderness, discharge, friability or lesion.     Uterus: Normal.      Adnexa: Right adnexa normal and left adnexa normal.       Right: No mass, tenderness or fullness.         Left: No mass, tenderness or fullness.       Rectum: Normal.  Musculoskeletal:     Cervical back: Normal range of motion and neck supple. No rigidity or tenderness.     Right lower leg: No edema.     Left lower leg: No edema.  Lymphadenopathy:     Cervical: No cervical adenopathy.     Lower Body: No right inguinal adenopathy. No left inguinal adenopathy.  Skin:    General: Skin is warm and dry.     Coloration: Skin is not jaundiced.     Findings: Rash (rosacea face) present. No bruising or erythema.  Neurological:     General: No focal deficit present.     Mental Status: She is alert and oriented to person, place, and time.     Sensory: No sensory deficit.     Motor: No weakness.  Psychiatric:        Mood and Affect: Mood normal.        Behavior: Behavior normal.      Assessment & Plan:  Generalized anxiety disorder -     Cariprazine  HCl; Take 1 capsule (1.5 mg total) by mouth daily.  Dispense: 30 capsule; Refill: 1 -     PARoxetine  HCl; Take 1 tablet (20 mg total) by mouth daily.  Dispense: 90 tablet; Refill: 1  Paranoia  (HCC) -     Cariprazine  HCl; Take 1 capsule (1.5 mg total) by mouth daily.  Dispense: 30 capsule; Refill: 1  Rosacea -     metroNIDAZOLE ; Apply 1 Application topically 2 (two) times daily.  Dispense: 45 g; Refill: 3  Primary hypertension -     Chlorthalidone ; Take 1 tablet (25 mg total) by mouth daily.  Dispense: 90 tablet; Refill: 1 -     CBC with Differential/Platelet -     Hemoglobin A1c -     TSH  Gastroesophageal reflux disease, unspecified whether esophagitis present -     CBC with Differential/Platelet  Hypokalemia due to loss of potassium -     Potassium Chloride  ER; Take 1 tablet (10 mEq total) by mouth daily.  Dispense: 90 tablet; Refill: 3 -     Comprehensive metabolic panel with GFR -     Magnesium   Left-sided carotid artery disease,  unspecified type (HCC) -     Lipid panel  Cervical cancer screening -     Cytology - PAP  Assessment and Plan    Paranoia and Anxiety Disorder Paranoia not well controlled with current medications. Paroxetine  not taken consistently. Vraylar  recommended for its efficacy in treating paranoia, anxiety, and depression. - Prescribe Vraylar . - Instruct to take paroxetine  daily. - Continue divalproex  (Depakote ). - FMLA paperwork completed in room with patient  Hypertension Hypertension not controlled with hydrochlorothiazide . Previous medication not tolerated. Transition to chlorthalidone  recommended. - Prescribe chlorthalidone . DC hydrochlorothiazide  - Order lab tests for potassium and other parameters.  Migraine Migraines effectively managed with Ubrelvy. - Continue [place].  Gastroesophageal Reflux Disease (GERD) GERD symptoms resolved with pantoprazole . - Continue pantoprazole .  History of Stroke Stroke in 2018 due to surgical complication. Currently on aspirin . - Continue aspirin .  Rosacea Metronidazole  gel recommended to manage symptoms and reduce side effects of oral antibiotics. - Prescribe metronidazole  gel for topical  use up to twice daily.   Pap smear completed today.     Total time spent including face to face time, chart review, lab interpretation, documentation and FMLA paperwork completion was 60 minutes   Return in about 2 weeks (around 08/04/2024). For lab review and recheck BP  Benton LITTIE Gave, PA

## 2024-07-21 NOTE — Patient Instructions (Signed)
 Start taking paxil  daily. Continue depakote . ADD vraylar .  Please STOP hydrochlorothiazide , we will switch to chlorthalidone  instead. Take this daily for your blood pressure. Monitor your blood pressure at home, goal 120/80.  Start the topical metronidazole  gel to your face 1-2x/ day.  Continue the pantoprazole  daily.  Please return in 2 weeks for a follow up on your blood pressure and lab review.

## 2024-07-22 ENCOUNTER — Ambulatory Visit: Payer: Self-pay | Admitting: Urgent Care

## 2024-07-22 ENCOUNTER — Encounter: Payer: Self-pay | Admitting: Urgent Care

## 2024-07-22 DIAGNOSIS — B9689 Other specified bacterial agents as the cause of diseases classified elsewhere: Secondary | ICD-10-CM

## 2024-07-22 LAB — COMPREHENSIVE METABOLIC PANEL WITH GFR
ALT: 22 IU/L (ref 0–32)
AST: 21 IU/L (ref 0–40)
Albumin: 4.4 g/dL (ref 3.8–4.9)
Alkaline Phosphatase: 103 IU/L (ref 44–121)
BUN/Creatinine Ratio: 16 (ref 9–23)
BUN: 13 mg/dL (ref 6–24)
Bilirubin Total: 0.4 mg/dL (ref 0.0–1.2)
CO2: 22 mmol/L (ref 20–29)
Calcium: 9.1 mg/dL (ref 8.7–10.2)
Chloride: 103 mmol/L (ref 96–106)
Creatinine, Ser: 0.81 mg/dL (ref 0.57–1.00)
Globulin, Total: 2.7 g/dL (ref 1.5–4.5)
Glucose: 91 mg/dL (ref 70–99)
Potassium: 3.3 mmol/L — ABNORMAL LOW (ref 3.5–5.2)
Sodium: 140 mmol/L (ref 134–144)
Total Protein: 7.1 g/dL (ref 6.0–8.5)
eGFR: 85 mL/min/1.73 (ref >59)

## 2024-07-22 LAB — MAGNESIUM: Magnesium: 2 mg/dL (ref 1.6–2.3)

## 2024-07-22 LAB — CBC WITH DIFFERENTIAL/PLATELET
Basophils Absolute: 0.1 x10E3/uL (ref 0.0–0.2)
Basos: 1 %
EOS (ABSOLUTE): 0.1 x10E3/uL (ref 0.0–0.4)
Eos: 2 %
Hematocrit: 43.1 % (ref 34.0–46.6)
Hemoglobin: 13.9 g/dL (ref 11.1–15.9)
Immature Grans (Abs): 0 x10E3/uL (ref 0.0–0.1)
Immature Granulocytes: 0 %
Lymphocytes Absolute: 2.4 x10E3/uL (ref 0.7–3.1)
Lymphs: 30 %
MCH: 31.7 pg (ref 26.6–33.0)
MCHC: 32.3 g/dL (ref 31.5–35.7)
MCV: 98 fL — ABNORMAL HIGH (ref 79–97)
Monocytes Absolute: 0.5 x10E3/uL (ref 0.1–0.9)
Monocytes: 7 %
Neutrophils Absolute: 4.8 x10E3/uL (ref 1.4–7.0)
Neutrophils: 60 %
Platelets: 235 x10E3/uL (ref 150–450)
RBC: 4.39 x10E6/uL (ref 3.77–5.28)
RDW: 12.9 % (ref 11.7–15.4)
WBC: 7.9 x10E3/uL (ref 3.4–10.8)

## 2024-07-22 LAB — LIPID PANEL
Chol/HDL Ratio: 2 ratio (ref 0.0–4.4)
Cholesterol, Total: 177 mg/dL (ref 100–199)
HDL: 88 mg/dL (ref >39)
LDL Chol Calc (NIH): 76 mg/dL (ref 0–99)
Triglycerides: 70 mg/dL (ref 0–149)
VLDL Cholesterol Cal: 13 mg/dL (ref 5–40)

## 2024-07-22 LAB — TSH: TSH: 2.67 u[IU]/mL (ref 0.450–4.500)

## 2024-07-22 LAB — HEMOGLOBIN A1C
Est. average glucose Bld gHb Est-mCnc: 100 mg/dL
Hgb A1c MFr Bld: 5.1 % (ref 4.8–5.6)

## 2024-07-25 LAB — CYTOLOGY - PAP
Adequacy: ABSENT
Comment: NEGATIVE
Diagnosis: NEGATIVE
High risk HPV: NEGATIVE

## 2024-07-25 MED ORDER — METRONIDAZOLE 500 MG PO TABS: 500.0000 mg | ORAL_TABLET | Freq: Two times a day (BID) | ORAL | 0 refills | Status: AC

## 2024-08-04 ENCOUNTER — Ambulatory Visit: Admitting: Urgent Care

## 2024-08-04 VITALS — BP 189/74 | HR 71 | Resp 18 | Ht 60.0 in | Wt 134.0 lb

## 2024-08-04 DIAGNOSIS — F411 Generalized anxiety disorder: Secondary | ICD-10-CM

## 2024-08-04 DIAGNOSIS — E876 Hypokalemia: Secondary | ICD-10-CM

## 2024-08-04 DIAGNOSIS — F22 Delusional disorders: Secondary | ICD-10-CM

## 2024-08-04 DIAGNOSIS — R1310 Dysphagia, unspecified: Secondary | ICD-10-CM

## 2024-08-04 DIAGNOSIS — G43E09 Chronic migraine with aura, not intractable, without status migrainosus: Secondary | ICD-10-CM

## 2024-08-04 DIAGNOSIS — I10 Essential (primary) hypertension: Secondary | ICD-10-CM | POA: Diagnosis not present

## 2024-08-04 MED ORDER — QULIPTA 60 MG PO TABS: 1.0000 | ORAL_TABLET | Freq: Every day | ORAL | 2 refills | Status: AC

## 2024-08-04 NOTE — Progress Notes (Signed)
 Established Patient Office Visit  Subjective:  Patient ID: Ashley Beard, female    DOB: 28-Jan-1966  Age: 58 y.o. MRN: [id number]  Chief Complaint  Patient presents with   Medical Management of Chronic Issues    Pt states she needs her fmla papers that neuro already filled out changed    HPI  Discussed the use of AI scribe software for clinical note transcription with the patient, who gave verbal consent to proceed.  History of Present Illness   Ashley Beard is a 58 year old female with hypertension and migraines who presents with concerns about her blood pressure and migraine management.  She is experiencing increased stress due to moving in with her daughter and issues with her FMLA paperwork, which have been affecting her health. In July, she visited urgent care due to concerns of having another stroke, experiencing symptoms such as difficulty swallowing.  She experiences migraines twice a month that are severe enough to keep her bedridden, and more minor migraines about twice a week. She is currently taking Ubrelvy for her migraines, which she reports is effective. She also experiences auras with squiggly lines and is sensitive to fluorescent lights at work, which can trigger migraines. Pt does develop N/V and sensitivity to smells and sounds which make working during these episodes unbearable.  Her blood pressure medication, chlorthalidone , has not been taken since the 15th because she was unable to fill the prescription until she got paid. She has a blood pressure cuff at home, which her daughter can help her use, but has not been monitoring at home. She has not started Vraylar  or Paxil  due to similar issues with filling prescriptions.  She mentions bruising easily and having fallen down the steps. She is concerned about her potassium levels, which were abnormal in recent blood work.       Patient Active Problem List   Diagnosis Date Noted   Gastroesophageal reflux disease  07/21/2024   Paranoia (HCC) 07/21/2024   Generalized anxiety disorder 07/21/2024   Rosacea 07/21/2024   Right shoulder pain 06/28/2020   Hypotension 12/19/2019   Encounter for completion of form with patient 12/19/2019   Caregiver stress syndrome 02/13/2019   Chronic migraine 09/15/2018   Hypokalemia due to loss of potassium 08/29/2018   Meningioma, cerebral (HCC) 08/18/2018   Encounter for medication management 06/25/2018   Other headache syndrome 06/13/2018   Abnormal mammogram of left breast 10/06/2017   Trigeminal neuralgia 06/22/2017   Right homonymous hemianopsia 03/16/2017   History of cerebrovascular accident (CVA) from left carotid artery occlusion involving left middle cerebral artery territory 02/26/2017   Dysphagia 02/22/2017   Primary hypertension 02/22/2017   Carotid artery disease (HCC) 02/22/2017   Past Medical History:  Diagnosis Date   Abnormal Pap smear of cervix    age 83   Anxiety    Carotid arterial disease (HCC)    Headache    Meningioma, cerebral (HCC) 08/18/2018   5 x 12 mm meningioma left middle cranial fossa similar in size to the prior MRI of 02/25/2017   Refusal of blood transfusions as patient is Jehovah's Witness    Rosacea    Stroke (HCC)    Trigeminal neuralgia 06/22/2017   Dx by neuro. Takes Tegretol  prn   Past Surgical History:  Procedure Laterality Date   ENDARTERECTOMY Left 02/24/2017   Procedure: ENDARTERECTOMY CAROTID with removal of thrombus ;  Surgeon: Krystal JULIANNA Doing, MD;  Location: [facility] OR;  Service: Vascular;  Laterality: Left;  GYNECOLOGIC CRYOSURGERY     age 58   IR GENERIC HISTORICAL  02/25/2017   IR ANGIO VERTEBRAL SEL SUBCLAVIAN INNOMINATE UNI R MOD SED 02/25/2017 Thyra Nash, MD [facility]   IR GENERIC HISTORICAL  02/25/2017   IR ANGIO INTRA EXTRACRAN SEL COM CAROTID INNOMINATE BILAT MOD SED 02/25/2017 Thyra Nash, MD [facility]   OOPHORECTOMY     TEE WITHOUT CARDIOVERSION N/A 05/04/2017   Procedure:  TRANSESOPHAGEAL ECHOCARDIOGRAM (TEE);  Surgeon: Pietro Redell RAMAN, MD;  Location: [facility] ENDOSCOPY;  Service: Cardiovascular;  Laterality: N/A;   Social History   Tobacco Use   Smoking status: Former    Current packs/day: 0.00    Types: Cigarettes    Quit date: 09/2016    Years since quitting: 7.9   Smokeless tobacco: Never   Tobacco comments:    very rare, social  Vaping Use   Vaping status: Never Used  Substance Use Topics   Alcohol use: Yes    Alcohol/week: 2.0 standard drinks of alcohol    Types: 2 Cans of beer per week    Comment: social   Drug use: No      ROS: as noted in HPI  Objective:     BP (!) 189/74   Pulse 71   Resp 18   Ht 5' (1.524 m)   Wt 134 lb (60.8 kg)   LMP 05/07/2018   SpO2 99%   BMI 26.17 kg/m  BP Readings from Last 3 Encounters:  08/04/24 (!) 189/74  07/21/24 (!) 176/99  06/23/24 115/80   Wt Readings from Last 3 Encounters:  08/04/24 134 lb (60.8 kg)  07/21/24 136 lb 4 oz (61.8 kg)  06/23/24 132 lb (59.9 kg)      Physical Exam Vitals and nursing note reviewed.  Constitutional:      General: She is not in acute distress.    Appearance: Normal appearance. She is not ill-appearing, toxic-appearing or diaphoretic.  HENT:     Head: Normocephalic and atraumatic.  Eyes:     General: No scleral icterus.       Right eye: No discharge.        Left eye: No discharge.     Extraocular Movements: Extraocular movements intact.     Pupils: Pupils are equal, round, and reactive to light.  Cardiovascular:     Rate and Rhythm: Normal rate.  Pulmonary:     Effort: Pulmonary effort is normal. No respiratory distress.  Skin:    General: Skin is warm and dry.     Coloration: Skin is not jaundiced.     Findings: No bruising, erythema or rash.  Neurological:     General: No focal deficit present.     Mental Status: She is alert and oriented to person, place, and time.     Gait: Gait normal.  Psychiatric:        Mood and Affect: Mood normal.         Behavior: Behavior normal.      No results found for any visits on 08/04/24.  Last CBC Lab Results  Component Value Date   WBC 7.9 07/21/2024   HGB 13.9 07/21/2024   HCT 43.1 07/21/2024   MCV 98 (H) 07/21/2024   MCH 31.7 07/21/2024   RDW 12.9 07/21/2024   PLT 235 07/21/2024   Last metabolic panel Lab Results  Component Value Date   GLUCOSE 91 07/21/2024   NA 140 07/21/2024   K 3.3 (L) 07/21/2024   CL 103 07/21/2024  CO2 22 07/21/2024   BUN 13 07/21/2024   CREATININE 0.81 07/21/2024   EGFR 85 07/21/2024   CALCIUM  9.1 07/21/2024   PROT 7.1 07/21/2024   ALBUMIN 4.4 07/21/2024   LABGLOB 2.7 07/21/2024   BILITOT 0.4 07/21/2024   ALKPHOS 103 07/21/2024   AST 21 07/21/2024   ALT 22 07/21/2024   ANIONGAP 11 02/23/2020   Last lipids Lab Results  Component Value Date   CHOL 177 07/21/2024   HDL 88 07/21/2024   LDLCALC 76 07/21/2024   TRIG 70 07/21/2024   CHOLHDL 2.0 07/21/2024   Last hemoglobin A1c Lab Results  Component Value Date   HGBA1C 5.1 07/21/2024      The ASCVD Risk score (Arnett DK, et al., 2019) failed to calculate for the following reasons:   Risk score cannot be calculated because patient has a medical history suggesting prior/existing ASCVD  Assessment & Plan:  Chronic migraine with aura without status migrainosus, not intractable -     Qulipta ; Take 1 tablet (60 mg total) by mouth daily.  Dispense: 90 tablet; Refill: 2  Generalized anxiety disorder  Paranoia (HCC)  Primary hypertension  Hypokalemia due to loss of potassium  Dysphagia, unspecified type  Assessment and Plan    Chronic migraine with aura Chronic migraines occur twice a month with major episodes and minor episodes occurring multiple times weekly, triggered by fluorescent lights. Ubrelvy effective for acute episodes. - Prescribed Qulipta  for prevention, expected to reduce frequency and hopefully prevent work call outs. - Monitor for potential interactions with current  medications. - Follow up in six weeks to assess effectiveness of Qulipta .  Essential hypertension Blood pressure elevated, likely due to non-adherence to chlorthalidone . Chlorthalidone  expected to normalize blood pressure with adherence. - Instructed to start chlorthalidone  as prescribed. - Monitor blood pressure at home using daughter's cuff. - Send blood pressure readings via MyChart in two weeks for potential dose adjustment.  Hypokalemia Previous blood work indicated low potassium levels. - Recheck potassium levels after starting chlorthalidone . - Encourage consumption of potassium-rich foods like bananas.  Bacterial vaginosis Bacterial vaginosis identified on recent Pap smear. - Use prescribed gel intravaginally every night for five nights.  Major depressive disorder with paranoia Currently prescribed Vraylar  and Paxil . Vraylar  not started due to financial constraints. Paxil  may contribute to easy bruising. - Start Vraylar  1.5 mg daily as soon as possible.  Easy bruising secondary to SSRI use Easy bruising likely secondary to Paxil  use. No evidence of bleeding disorder as labs are normal. - Reassured that bruising is not due to a bleeding disorder.         Return in about 6 weeks (around 09/15/2024).   Benton LITTIE Gave, PA

## 2024-08-04 NOTE — Patient Instructions (Addendum)
 Start Qulipta  daily. This helps prevent migraines. You can still take the ubrelvy when needed for acute migraine.  Start the chlorthalidone  daily for BP management. Please monitor your blood pressure at home, goal 120/80 Continue daily potassium supplements. Eating a daily banana may also help.  Start vraylar  1.5mg  tab daily.  Use the metronidazole  gel intravaginally nightly x 5 nights.  Follow up in office in 6-8 weeks.

## 2024-08-08 ENCOUNTER — Encounter: Payer: Self-pay | Admitting: Urgent Care

## 2024-08-08 ENCOUNTER — Encounter: Payer: Self-pay | Admitting: Sports Medicine

## 2024-08-09 ENCOUNTER — Telehealth: Payer: Self-pay | Admitting: Urgent Care

## 2024-08-09 NOTE — Telephone Encounter (Signed)
 Copied from CRM #[id number]. Topic: General - Other >> Aug 09, 2024  2:51 PM Kevelyn M wrote: Reason for CRM: Please Fax FMLA paperwork to:   Fax #[phone]  ATTN: Cathryne Or CC: Merlynn Chow

## 2024-08-09 NOTE — Telephone Encounter (Signed)
 Ashley Beard, did you fax these FMLA papers in today (08/09/24) for me? Here was the fax info if needed.[name]

## 2024-08-10 NOTE — Telephone Encounter (Signed)
 Patient came to pick up a copy of FMLA paperwork

## 2024-08-15 NOTE — Telephone Encounter (Signed)
 Paperwork was indeed faxed 08/09/2024.

## 2024-08-17 ENCOUNTER — Ambulatory Visit: Admitting: Physician Assistant

## 2024-09-01 ENCOUNTER — Other Ambulatory Visit (HOSPITAL_COMMUNITY): Payer: Self-pay

## 2024-09-01 ENCOUNTER — Telehealth: Payer: Self-pay

## 2024-09-01 NOTE — Telephone Encounter (Signed)
 Pharmacy Patient Advocate Encounter   Received notification from Onbase that prior authorization for Qulipta  60MG  tablets  is required/requested.   Insurance verification completed.   The patient is insured through [facility] .   Per test claim: PA required; PA submitted to above mentioned insurance via Latent Key/confirmation #/EOC [id number] Status is pending

## 2024-09-04 ENCOUNTER — Other Ambulatory Visit (HOSPITAL_COMMUNITY): Payer: Self-pay

## 2024-09-04 NOTE — Telephone Encounter (Signed)
 Pharmacy Patient Advocate Encounter  Received notification from [facility] that Prior Authorization for Qulipta  60MG  tablets  has been APPROVED from 09/02/24 to 11/24/24. Ran test claim, Copay is $0. This test claim was processed through [facility]- copay amounts may vary at other pharmacies due to pharmacy/plan contracts, or as the patient moves through the different stages of their insurance plan.   PA #/Case ID/Reference #: [id number]

## 2024-09-06 ENCOUNTER — Other Ambulatory Visit: Payer: Self-pay

## 2024-09-06 DIAGNOSIS — L719 Rosacea, unspecified: Secondary | ICD-10-CM

## 2024-09-15 ENCOUNTER — Encounter: Payer: Self-pay | Admitting: Urgent Care

## 2024-09-15 ENCOUNTER — Ambulatory Visit: Admitting: Urgent Care

## 2024-09-15 VITALS — BP 123/79 | HR 76 | Ht 60.0 in | Wt 138.0 lb

## 2024-09-15 DIAGNOSIS — L719 Rosacea, unspecified: Secondary | ICD-10-CM | POA: Diagnosis not present

## 2024-09-15 DIAGNOSIS — F411 Generalized anxiety disorder: Secondary | ICD-10-CM | POA: Diagnosis not present

## 2024-09-15 DIAGNOSIS — I10 Essential (primary) hypertension: Secondary | ICD-10-CM

## 2024-09-15 DIAGNOSIS — F22 Delusional disorders: Secondary | ICD-10-CM | POA: Diagnosis not present

## 2024-09-15 DIAGNOSIS — R1310 Dysphagia, unspecified: Secondary | ICD-10-CM

## 2024-09-15 DIAGNOSIS — E876 Hypokalemia: Secondary | ICD-10-CM

## 2024-09-15 MED ORDER — PANTOPRAZOLE SODIUM 40 MG PO TBEC: 40.0000 mg | DELAYED_RELEASE_TABLET | Freq: Every day | ORAL | 3 refills | Status: DC

## 2024-09-15 MED ORDER — METRONIDAZOLE 1 % EX GEL: Freq: Every day | CUTANEOUS | 0 refills | Status: DC

## 2024-09-15 MED ORDER — METRONIDAZOLE 1 % EX GEL: Freq: Every day | CUTANEOUS | 0 refills | Status: AC

## 2024-09-15 MED ORDER — CARIPRAZINE HCL 1.5 MG PO CAPS: 1.5000 mg | ORAL_CAPSULE | Freq: Every day | ORAL | 1 refills | Status: DC

## 2024-09-15 MED ORDER — CARIPRAZINE HCL 1.5 MG PO CAPS: 1.5000 mg | ORAL_CAPSULE | Freq: Every day | ORAL | 1 refills | Status: AC

## 2024-09-15 MED ORDER — PANTOPRAZOLE SODIUM 40 MG PO TBEC: 40.0000 mg | DELAYED_RELEASE_TABLET | Freq: Every day | ORAL | 3 refills | Status: AC

## 2024-09-15 NOTE — Progress Notes (Signed)
 Established Patient Office Visit  Subjective:  Patient ID: Ashley Beard, female    DOB: September 08, 1966  Age: 58 y.o. MRN: [id number]  Chief Complaint  Patient presents with   Follow-up    Blood work and New Meds    HPI  Discussed the use of AI scribe software for clinical note transcription with the patient, who gave verbal consent to proceed.  History of Present Illness   Ashley Beard is a 58 year old female who presents for medication management and follow-up.  She has been taking Qulipta  as prescribed and has experienced significant improvement in her migraines, with no migraines since starting the medication. She is also taking potassium chloride , which she feels has increased her energy levels significantly.  There is some confusion regarding her use of Vraylar . She is unsure if she picked it up from the pharmacy and does not recall taking it, although it was prescribed to her previously for paranoia symptoms. She is currently taking Paxil  and feels good with no side effects, but acknowledges that Paxil  does not address paranoia.  She has a history of rosacea and has been using metronidazole  gel on her face, which is helping. She completed a seven-day course of oral antibiotics as well.  She is currently taking Protonix  for gastrointestinal symptoms, which were previously managed by another doctor. She also takes chlorthalidone  for blood pressure management, which necessitates potassium supplementation due to its effects on the kidneys.  She mentions feeling 'almost like my normal self' after moving in with her daughter, indicating an improvement in her overall well-being. She does not express any current concerns and feels her blood pressure is well-controlled.      Patient Active Problem List   Diagnosis Date Noted   Gastroesophageal reflux disease 07/21/2024   Paranoia (HCC) 07/21/2024   Generalized anxiety disorder 07/21/2024   Rosacea 07/21/2024   Right shoulder pain  06/28/2020   Hypotension 12/19/2019   Encounter for completion of form with patient 12/19/2019   Caregiver stress syndrome 02/13/2019   Chronic migraine 09/15/2018   Hypokalemia due to loss of potassium 08/29/2018   Meningioma, cerebral (HCC) 08/18/2018   Encounter for medication management 06/25/2018   Other headache syndrome 06/13/2018   Abnormal mammogram of left breast 10/06/2017   Trigeminal neuralgia 06/22/2017   Right homonymous hemianopsia 03/16/2017   History of cerebrovascular accident (CVA) from left carotid artery occlusion involving left middle cerebral artery territory 02/26/2017   Dysphagia 02/22/2017   Primary hypertension 02/22/2017   Carotid artery disease 02/22/2017   Past Medical History:  Diagnosis Date   Abnormal Pap smear of cervix    age 50   Anxiety    Carotid arterial disease    Headache    Meningioma, cerebral (HCC) 08/18/2018   5 x 12 mm meningioma left middle cranial fossa similar in size to the prior MRI of 02/25/2017   Refusal of blood transfusions as patient is Jehovah's Witness    Rosacea    Stroke (HCC)    Trigeminal neuralgia 06/22/2017   Dx by neuro. Takes Tegretol  prn   Past Surgical History:  Procedure Laterality Date   ENDARTERECTOMY Left 02/24/2017   Procedure: ENDARTERECTOMY CAROTID with removal of thrombus ;  Surgeon: Krystal JULIANNA Doing, MD;  Location: [facility] OR;  Service: Vascular;  Laterality: Left;   GYNECOLOGIC CRYOSURGERY     age 15   IR GENERIC HISTORICAL  02/25/2017   IR ANGIO VERTEBRAL SEL SUBCLAVIAN INNOMINATE UNI R MOD SED 02/25/2017 Ashley Nash,  MD [facility]   IR GENERIC HISTORICAL  02/25/2017   IR ANGIO INTRA EXTRACRAN SEL COM CAROTID INNOMINATE BILAT MOD SED 02/25/2017 Ashley Nash, MD [facility]   OOPHORECTOMY     TEE WITHOUT CARDIOVERSION N/A 05/04/2017   Procedure: TRANSESOPHAGEAL ECHOCARDIOGRAM (TEE);  Surgeon: Pietro Redell RAMAN, MD;  Location: [facility] ENDOSCOPY;  Service: Cardiovascular;  Laterality: N/A;   Social  History   Tobacco Use   Smoking status: Former    Current packs/day: 0.00    Types: Cigarettes    Quit date: 09/2016    Years since quitting: 8.0   Smokeless tobacco: Never   Tobacco comments:    very rare, social  Vaping Use   Vaping status: Never Used  Substance Use Topics   Alcohol use: Yes    Alcohol/week: 2.0 standard drinks of alcohol    Types: 2 Cans of beer per week    Comment: social   Drug use: No      ROS: as noted in HPI  Objective:     BP 123/79   Pulse 76   Ht 5' (1.524 m)   Wt 138 lb (62.6 kg)   LMP 05/07/2018   SpO2 100%   BMI 26.95 kg/m  BP Readings from Last 3 Encounters:  09/15/24 123/79  08/04/24 (!) 189/74  07/21/24 (!) 176/99   Wt Readings from Last 3 Encounters:  09/15/24 138 lb (62.6 kg)  08/04/24 134 lb (60.8 kg)  07/21/24 136 lb 4 oz (61.8 kg)      Physical Exam Vitals and nursing note reviewed.  Constitutional:      General: She is not in acute distress.    Appearance: Normal appearance. She is not ill-appearing, toxic-appearing or diaphoretic.  HENT:     Head: Normocephalic and atraumatic.     Nose: No congestion or rhinorrhea.     Mouth/Throat:     Mouth: Mucous membranes are moist.     Pharynx: No oropharyngeal exudate or posterior oropharyngeal erythema.  Eyes:     General: No scleral icterus.       Right eye: No discharge.        Left eye: No discharge.     Extraocular Movements: Extraocular movements intact.     Pupils: Pupils are equal, round, and reactive to light.  Cardiovascular:     Rate and Rhythm: Normal rate and regular rhythm.     Heart sounds: No murmur heard. Pulmonary:     Effort: Pulmonary effort is normal. No respiratory distress.     Breath sounds: Normal breath sounds. No stridor.  Musculoskeletal:     Cervical back: Normal range of motion and neck supple. No rigidity or tenderness.  Lymphadenopathy:     Cervical: No cervical adenopathy.  Skin:    General: Skin is warm and dry.     Coloration:  Skin is not jaundiced.     Findings: No bruising, erythema or rash.  Neurological:     General: No focal deficit present.     Mental Status: She is alert and oriented to person, place, and time.     Gait: Gait normal.  Psychiatric:        Mood and Affect: Mood normal.        Behavior: Behavior normal.      No results found for any visits on 09/15/24.  Last CBC Lab Results  Component Value Date   WBC 7.9 07/21/2024   HGB 13.9 07/21/2024   HCT 43.1 07/21/2024   MCV 98 (  H) 07/21/2024   MCH 31.7 07/21/2024   RDW 12.9 07/21/2024   PLT 235 07/21/2024   Last metabolic panel Lab Results  Component Value Date   GLUCOSE 91 07/21/2024   NA 140 07/21/2024   K 3.3 (L) 07/21/2024   CL 103 07/21/2024   CO2 22 07/21/2024   BUN 13 07/21/2024   CREATININE 0.81 07/21/2024   EGFR 85 07/21/2024   CALCIUM  9.1 07/21/2024   PROT 7.1 07/21/2024   ALBUMIN 4.4 07/21/2024   LABGLOB 2.7 07/21/2024   BILITOT 0.4 07/21/2024   ALKPHOS 103 07/21/2024   AST 21 07/21/2024   ALT 22 07/21/2024   ANIONGAP 11 02/23/2020   Last lipids Lab Results  Component Value Date   CHOL 177 07/21/2024   HDL 88 07/21/2024   LDLCALC 76 07/21/2024   TRIG 70 07/21/2024   CHOLHDL 2.0 07/21/2024   Last hemoglobin A1c Lab Results  Component Value Date   HGBA1C 5.1 07/21/2024   Last thyroid  functions Lab Results  Component Value Date   TSH 2.670 07/21/2024   Last vitamin D No results found for: 25OHVITD2, 25OHVITD3, VD25OH Last vitamin B12 and Folate Lab Results  Component Value Date   VITAMINB12 752 12/25/2019      The ASCVD Risk score (Arnett DK, et al., 2019) failed to calculate for the following reasons:   Risk score cannot be calculated because patient has a medical history suggesting prior/existing ASCVD  Assessment & Plan:  Rosacea -     metroNIDAZOLE ; Apply topically daily.  Dispense: 45 g; Refill: 0  Generalized anxiety disorder -     Cariprazine  HCl; Take 1 capsule (1.5 mg  total) by mouth daily.  Dispense: 30 capsule; Refill: 1  Paranoia (HCC) -     Cariprazine  HCl; Take 1 capsule (1.5 mg total) by mouth daily.  Dispense: 30 capsule; Refill: 1  Dysphagia, unspecified type -     Pantoprazole  Sodium; Take 1 tablet (40 mg total) by mouth daily.  Dispense: 30 tablet; Refill: 3  Hypokalemia due to loss of potassium -     Basic metabolic panel with GFR  Primary hypertension  Assessment and Plan    Major depressive disorder with paranoia symptoms Symptoms improved. Vraylar  prescribed for paranoia, unclear if taken. Paxil  well-tolerated, ineffective for paranoia. - List all current medications in after visit summary for her to verify at home. - Send MyChart message to confirm Vraylar  usage. - Call in Vraylar  prescription if being taken, due in five days.  Migraine Symptoms well-controlled with Qulipta .  Essential hypertension Blood pressure well-controlled with current regimen including chlorthalidone .  Hypokalemia Potassium levels previously low due to chlorthalidone . Supplementation improved energy. - Double check potassium levels to ensure stabilization and avoid hyperkalemia.  Gastroesophageal reflux disease (GERD) Symptoms managed with Protonix . - Call in Protonix  prescription as it is due soon.  Rosacea Treated with metronidazole  gel. Current dosage suboptimal. - Call in metronidazole  gel at 1.3% for rosacea.         Return in about 6 months (around 03/16/2025).   Benton LITTIE Gave, PA

## 2024-09-15 NOTE — Patient Instructions (Addendum)
 Please double check the med list attached to this summary and compare to what you are taking at home.  Start the topical metronidazole  1% gel once daily to affected areas of face.  I have refilled your meds;   Return in 6 months

## 2024-09-16 ENCOUNTER — Ambulatory Visit: Payer: Self-pay | Admitting: Urgent Care

## 2024-09-16 LAB — BASIC METABOLIC PANEL WITH GFR
BUN/Creatinine Ratio: 13 (ref 9–23)
BUN: 11 mg/dL (ref 6–24)
CO2: 25 mmol/L (ref 20–29)
Calcium: 9.6 mg/dL (ref 8.7–10.2)
Chloride: 103 mmol/L (ref 96–106)
Creatinine, Ser: 0.85 mg/dL (ref 0.57–1.00)
Glucose: 122 mg/dL — ABNORMAL HIGH (ref 70–99)
Potassium: 4 mmol/L (ref 3.5–5.2)
Sodium: 141 mmol/L (ref 134–144)
eGFR: 80 mL/min/1.73 (ref >59)

## 2024-09-25 DIAGNOSIS — L719 Rosacea, unspecified: Secondary | ICD-10-CM | POA: Diagnosis not present

## 2024-11-01 DIAGNOSIS — F411 Generalized anxiety disorder: Secondary | ICD-10-CM | POA: Diagnosis not present

## 2025-03-16 ENCOUNTER — Ambulatory Visit: Admitting: Urgent Care
